# Patient Record
Sex: Female | Born: 1945
Health system: Southern US, Community
[De-identification: ages and names within clinical notes are randomized; demographics above are authoritative.]

## PROBLEM LIST (undated history)

## (undated) DIAGNOSIS — M8580 Other specified disorders of bone density and structure, unspecified site: Secondary | ICD-10-CM

## (undated) DIAGNOSIS — M1990 Unspecified osteoarthritis, unspecified site: Secondary | ICD-10-CM

## (undated) DIAGNOSIS — B2790 Infectious mononucleosis, unspecified without complication: Secondary | ICD-10-CM

## (undated) DIAGNOSIS — M797 Fibromyalgia: Secondary | ICD-10-CM

## (undated) DIAGNOSIS — E039 Hypothyroidism, unspecified: Secondary | ICD-10-CM

## (undated) DIAGNOSIS — I471 Supraventricular tachycardia, unspecified: Secondary | ICD-10-CM

## (undated) DIAGNOSIS — E042 Nontoxic multinodular goiter: Secondary | ICD-10-CM

## (undated) DIAGNOSIS — E041 Nontoxic single thyroid nodule: Secondary | ICD-10-CM

## (undated) DIAGNOSIS — E079 Disorder of thyroid, unspecified: Secondary | ICD-10-CM

## (undated) DIAGNOSIS — R002 Palpitations: Secondary | ICD-10-CM

## (undated) DIAGNOSIS — R011 Cardiac murmur, unspecified: Secondary | ICD-10-CM

## (undated) DIAGNOSIS — Z8619 Personal history of other infectious and parasitic diseases: Secondary | ICD-10-CM

## (undated) DIAGNOSIS — B009 Herpesviral infection, unspecified: Secondary | ICD-10-CM

## (undated) DIAGNOSIS — Z9889 Other specified postprocedural states: Secondary | ICD-10-CM

## (undated) HISTORY — DX: Supraventricular tachycardia, unspecified: I47.10

## (undated) HISTORY — DX: Infectious mononucleosis, unspecified without complication: B27.90

## (undated) HISTORY — DX: Other specified disorders of bone density and structure, unspecified site: M85.80

## (undated) HISTORY — DX: Nontoxic multinodular goiter: E04.2

## (undated) HISTORY — DX: Hypothyroidism, unspecified: E03.9

## (undated) HISTORY — DX: Herpesviral infection, unspecified: B00.9

## (undated) HISTORY — DX: Nontoxic single thyroid nodule: E04.1

## (undated) HISTORY — DX: Supraventricular tachycardia: I47.1

## (undated) HISTORY — PX: OTHER SURGICAL HISTORY: SHX169

## (undated) HISTORY — DX: Disorder of thyroid, unspecified: E07.9

## (undated) HISTORY — DX: Cardiac murmur, unspecified: R01.1

## (undated) HISTORY — DX: Palpitations: R00.2

## (undated) HISTORY — PX: TONSILLECTOMY: SHX5217

## (undated) HISTORY — DX: Personal history of other infectious and parasitic diseases: Z86.19

---
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
## 1982-03-01 DIAGNOSIS — B009 Herpesviral infection, unspecified: Secondary | ICD-10-CM

## 1982-03-01 HISTORY — DX: Herpesviral infection, unspecified: B00.9

## 1997-10-02 ENCOUNTER — Ambulatory Visit (HOSPITAL_COMMUNITY): Admission: RE | Admit: 1997-10-02 | Discharge: 1997-10-02 | Payer: Self-pay | Admitting: *Deleted

## 1997-10-28 ENCOUNTER — Other Ambulatory Visit: Admission: RE | Admit: 1997-10-28 | Discharge: 1997-10-28 | Payer: Self-pay | Admitting: *Deleted

## 1999-04-16 ENCOUNTER — Ambulatory Visit (HOSPITAL_BASED_OUTPATIENT_CLINIC_OR_DEPARTMENT_OTHER): Admission: RE | Admit: 1999-04-16 | Discharge: 1999-04-16 | Payer: Self-pay | Admitting: Plastic Surgery

## 1999-07-03 ENCOUNTER — Other Ambulatory Visit: Admission: RE | Admit: 1999-07-03 | Discharge: 1999-07-03 | Payer: Self-pay | Admitting: *Deleted

## 1999-08-11 ENCOUNTER — Ambulatory Visit (HOSPITAL_COMMUNITY): Admission: RE | Admit: 1999-08-11 | Discharge: 1999-08-11 | Payer: Self-pay | Admitting: *Deleted

## 1999-08-11 ENCOUNTER — Encounter: Payer: Self-pay | Admitting: *Deleted

## 2000-11-24 ENCOUNTER — Other Ambulatory Visit: Admission: RE | Admit: 2000-11-24 | Discharge: 2000-11-24 | Payer: Self-pay | Admitting: *Deleted

## 2000-12-06 ENCOUNTER — Encounter: Payer: Self-pay | Admitting: *Deleted

## 2000-12-06 ENCOUNTER — Ambulatory Visit (HOSPITAL_COMMUNITY): Admission: RE | Admit: 2000-12-06 | Discharge: 2000-12-06 | Payer: Self-pay | Admitting: *Deleted

## 2000-12-15 ENCOUNTER — Encounter: Payer: Self-pay | Admitting: *Deleted

## 2000-12-15 ENCOUNTER — Encounter: Admission: RE | Admit: 2000-12-15 | Discharge: 2000-12-15 | Payer: Self-pay | Admitting: *Deleted

## 2000-12-15 HISTORY — PX: BREAST CYST ASPIRATION: SHX578

## 2003-05-17 ENCOUNTER — Ambulatory Visit (HOSPITAL_COMMUNITY): Admission: RE | Admit: 2003-05-17 | Discharge: 2003-05-17 | Payer: Self-pay | Admitting: *Deleted

## 2004-06-02 ENCOUNTER — Ambulatory Visit (HOSPITAL_COMMUNITY): Admission: RE | Admit: 2004-06-02 | Discharge: 2004-06-02 | Payer: Self-pay | Admitting: *Deleted

## 2004-06-25 ENCOUNTER — Other Ambulatory Visit: Admission: RE | Admit: 2004-06-25 | Discharge: 2004-06-25 | Payer: Self-pay | Admitting: *Deleted

## 2005-08-25 ENCOUNTER — Ambulatory Visit (HOSPITAL_COMMUNITY): Admission: RE | Admit: 2005-08-25 | Discharge: 2005-08-25 | Payer: Self-pay | Admitting: Obstetrics & Gynecology

## 2005-08-25 ENCOUNTER — Other Ambulatory Visit: Admission: RE | Admit: 2005-08-25 | Discharge: 2005-08-25 | Payer: Self-pay | Admitting: Obstetrics & Gynecology

## 2006-08-31 ENCOUNTER — Ambulatory Visit (HOSPITAL_COMMUNITY): Admission: RE | Admit: 2006-08-31 | Discharge: 2006-08-31 | Payer: Self-pay | Admitting: Obstetrics & Gynecology

## 2006-09-26 ENCOUNTER — Encounter: Admission: RE | Admit: 2006-09-26 | Discharge: 2006-09-26 | Payer: Self-pay | Admitting: Obstetrics & Gynecology

## 2006-09-30 ENCOUNTER — Other Ambulatory Visit: Admission: RE | Admit: 2006-09-30 | Discharge: 2006-09-30 | Payer: Self-pay | Admitting: Obstetrics & Gynecology

## 2007-10-06 ENCOUNTER — Ambulatory Visit (HOSPITAL_COMMUNITY): Admission: RE | Admit: 2007-10-06 | Discharge: 2007-10-06 | Payer: Self-pay | Admitting: Obstetrics & Gynecology

## 2007-10-06 ENCOUNTER — Other Ambulatory Visit: Admission: RE | Admit: 2007-10-06 | Discharge: 2007-10-06 | Payer: Self-pay | Admitting: Obstetrics & Gynecology

## 2008-10-30 ENCOUNTER — Encounter: Admission: RE | Admit: 2008-10-30 | Discharge: 2008-10-30 | Payer: Self-pay | Admitting: Obstetrics & Gynecology

## 2008-11-06 ENCOUNTER — Other Ambulatory Visit: Admission: RE | Admit: 2008-11-06 | Discharge: 2008-11-06 | Payer: Self-pay | Admitting: Interventional Radiology

## 2008-11-06 ENCOUNTER — Encounter (INDEPENDENT_AMBULATORY_CARE_PROVIDER_SITE_OTHER): Payer: Self-pay | Admitting: Interventional Radiology

## 2008-11-06 ENCOUNTER — Encounter: Admission: RE | Admit: 2008-11-06 | Discharge: 2008-11-06 | Payer: Self-pay | Admitting: Obstetrics & Gynecology

## 2009-09-19 ENCOUNTER — Encounter: Admission: RE | Admit: 2009-09-19 | Discharge: 2009-09-19 | Payer: Self-pay | Admitting: Endocrinology

## 2009-11-10 ENCOUNTER — Encounter: Admission: RE | Admit: 2009-11-10 | Discharge: 2009-11-10 | Payer: Self-pay | Admitting: Obstetrics & Gynecology

## 2010-03-21 ENCOUNTER — Other Ambulatory Visit: Payer: Self-pay | Admitting: Endocrinology

## 2010-03-21 DIAGNOSIS — E041 Nontoxic single thyroid nodule: Secondary | ICD-10-CM

## 2010-04-10 ENCOUNTER — Ambulatory Visit
Admission: RE | Admit: 2010-04-10 | Discharge: 2010-04-10 | Disposition: A | Discharge status: Home / Self Care | Payer: BC Managed Care – PPO | Source: Ambulatory Visit | Attending: Endocrinology | Admitting: Endocrinology

## 2010-04-10 DIAGNOSIS — E041 Nontoxic single thyroid nodule: Secondary | ICD-10-CM

## 2010-07-17 NOTE — Op Note (Signed)
[place]  Patient:    Lindsay [name], Lindsay [name]                      MRN: [id number] Proc. Date: 04/16/99 Adm. Date:  [id number] Attending:  Chapman Moss CC:         Teena Irani. Odis Luster, M.D.             Dr. Raquel Sarna                           Operative Report  PREOPERATIVE DIAGNOSES: 1. Suspicious lesion, undetermined behavior of the posterior trunk x 3. 2. Complex open wound trunk, total length greater than 3.5 cm. 3. Two lesions anterior trunk, which are irritated and suspicious lesion chest.  POSTOPERATIVE DIAGNOSES: 1. Suspicious lesion, undetermined behavior of the posterior trunk x 3. 2. Complex open wound trunk, total length greater than 3.5 cm. 3. Two lesions anterior trunk, which are irritated and suspicious lesion chest.  PROCEDURES PERFORMED: 1. Excision lesions of undetermined behavior, each one less than 0.5 cm, a total of    three. 2. Complex wound closure of the posterior trunk, 3.5 cm total length. 3. Excision two lesions abdominal area, less than 0.5 cm each. 4. Punch biopsy anterior chest.  SURGEON:  Teena Irani. Odis Luster, M.D.  ANESTHESIA:  1% Xylocaine with epinephrine plus bicarbonate.  CLINICAL NOTE:  This is a 65 year old woman, who had had previous excision of lesions of her back.  One of these appeared to be dysplastic.  This now has recurrent pigmentation in the scar and she presents for a wide excision of this  area.  In addition, she has two other lesions lower back, which are quite suspicious with irregular borders and she would like to have those removed. Also, two lesions, upper abdominal area that are irritated by clothing and warrant excision.  Finally, a lesion anterior chest, upper aspect, which is fairly extensive and may just represent an irritated ______ and this also warrants a punch biopsy for tissue diagnosis.  The nature of procedure, risks and possible  complications were discussed.  The possibility  of further surgery depending on he final pathology report for these findings and she wished to proceed.  DESCRIPTION OF PROCEDURE:  The patient taken to the operating room and placed first prone.  She was prepped with Betadine and draped with sterile drapes. Satisfactory local anesthesia was achieved and elliptical excisions of the various lesions performed.  Wounds irrigated thoroughly and excellent hemostasis having been noted, layered closure with 4-0 Monocryl interrupted, inverted deep sutures, 4-0 Monocryl running subcuticular sutures.  Steri-Strips and dry, sterile dressings applied.   She was then placed supine, again prepped with Betadine and draped with sterile  drapes.  Elliptical excision of the lesions on her abdomen was performed. Wounds irrigated and deep 4-0 Monocryl suture placed as needed and 6-0 Prolene running  simple sutures.  Punch biopsy taken lesion anterior chest and this would also irrigated thoroughly as had been done for all the other wounds and closed with  6-0 Prolene simple suture.  She tolerated the procedure well.  DISPOSITION:  She may shower starting tomorrow.  We will see her in the office ext week for recheck. DD:  04/16/99 TD:  04/16/99 Job: [id number]

## 2010-08-25 ENCOUNTER — Encounter: Payer: Self-pay | Admitting: Internal Medicine

## 2010-08-26 ENCOUNTER — Encounter: Payer: Self-pay | Admitting: Internal Medicine

## 2010-08-26 ENCOUNTER — Ambulatory Visit (INDEPENDENT_AMBULATORY_CARE_PROVIDER_SITE_OTHER): Payer: BC Managed Care – PPO | Admitting: Internal Medicine

## 2010-08-26 DIAGNOSIS — E669 Obesity, unspecified: Secondary | ICD-10-CM

## 2010-08-26 DIAGNOSIS — R002 Palpitations: Secondary | ICD-10-CM | POA: Insufficient documentation

## 2010-08-26 NOTE — Assessment & Plan Note (Signed)
Today I do not have any electrograms to review. I have discussed the case with Dr. Jacinto Halim who notes that she has had some SVT. I plan to review her strips but recommend a period of watchful waiting as she has been minimally asymptomatic with regard to any SVT.

## 2010-08-26 NOTE — Patient Instructions (Signed)
Your physician recommends that you schedule a follow-up appointment as needed  

## 2010-08-26 NOTE — Progress Notes (Signed)
HPI [name] is referred today for evaluation of palpitations by Dr. Jacinto Halim. The patient notes that she has had palpitations intermittently for 20 yrs. About 2 yrs ago, she began experiencing increasingly frequent episodes associated with the sensation that her heart is beating hard. She is not experiencing the sensation that her heart is racing. The patient has not had syncope. She notes that over the past few weeks the episodes have become even more frequent. She thinks that her spells are more frequent when she exerts herself. She has not had chest pain, sob, or syncope. No Known Allergies   Current Outpatient Prescriptions  Medication Sig Dispense Refill  . fish oil-omega-3 fatty acids 1000 MG capsule Take 2 g by mouth daily.        . Misc Natural Products (CALCIUM PLUS ADVANCED PO) Take by mouth. 2 tabs daily       . Multiple Vitamin (MULTIVITAMIN) tablet Take 1 tablet by mouth daily.        . Probiotic Product (PROBIOTIC FORMULA PO) Take by mouth. daily       . vitamin B-12 (CYANOCOBALAMIN) 1000 MCG tablet Take 1,000 mcg by mouth daily.        Marland Kitchen VITAMIN D, CHOLECALCIFEROL, PO Take by mouth. 1 tab daily          Past Medical History  Diagnosis Date  . Palpitations   . SVT (supraventricular tachycardia)   . Chest pain   . Hypothyroidism   . Multinodular goiter     ROS:   All systems reviewed and negative except as noted in the HPI.   Past Surgical History  Procedure Date  . Tonsillectomy      Family History  Problem Relation Age of Onset  . Dementia Mother 24    alive  . Transient ischemic attack Mother     hx of; has a pacemaker  . Other Father 49    old age. Develop CHF the last 4 months  . Other Sister 18    died of thymoma     History   Social History  . Marital Status: Legally Separated    Spouse Name: N/A    Number of Children: 2  . Years of Education: N/A   Occupational History  . Not on file.   Social History Main Topics  . Smoking status:  Former Smoker    Types: Cigarettes    Quit date: 03/01/1970  . Smokeless tobacco: Not on file  . Alcohol Use: Yes     1 drink per night  . Drug Use: No  . Sexually Active: Not on file   Other Topics Concern  . Not on file   Social History Narrative  . No narrative on file     BP 106/66  Pulse 70  Ht 5\' 3"  (1.6 m)  Wt 178 lb (80.74 kg)  BMI 31.53 kg/m2  Physical Exam:  Obese, well appearing NAD HEENT: Unremarkable Neck:  No JVD, no thyromegally Lymphatics:  No adenopathy Back:  No CVA tenderness Lungs:  Clear HEART:  Regular rate rhythm, no murmurs, no rubs, no clicks Abd:  Obese, positive bowel sounds, no organomegally, no rebound, no guarding Ext:  2 plus pulses, no edema, no cyanosis, no clubbing Skin:  No rashes no nodules Neuro:  CN II through XII intact, motor grossly intact  EKG NSR with frequent PAC's consistent with wandering atrial pacemaker DEVICE  Normal device function.  See PaceArt for details.   Assess/Plan:

## 2010-08-27 ENCOUNTER — Institutional Professional Consult (permissible substitution): Payer: BC Managed Care – PPO | Admitting: Internal Medicine

## 2010-10-05 ENCOUNTER — Ambulatory Visit (INDEPENDENT_AMBULATORY_CARE_PROVIDER_SITE_OTHER): Payer: BC Managed Care – PPO | Admitting: Internal Medicine

## 2010-10-05 ENCOUNTER — Encounter: Payer: Self-pay | Admitting: Internal Medicine

## 2010-10-05 VITALS — BP 84/58 | HR 62 | Resp 16 | Ht 63.0 in | Wt 178.0 lb

## 2010-10-05 DIAGNOSIS — I471 Supraventricular tachycardia, unspecified: Secondary | ICD-10-CM

## 2010-10-05 MED ORDER — METOPROLOL TARTRATE 25 MG PO TABS: 25.0000 mg | ORAL_TABLET | Freq: Two times a day (BID) | ORAL | Status: DC

## 2010-10-05 NOTE — Patient Instructions (Signed)
Your physician recommends that you schedule a follow-up appointment in: 4 months with Dr Ladona Ridgel  Your physician has recommended you make the following change in your medication: start Metoprolol 25mg  twice daily

## 2010-10-05 NOTE — Progress Notes (Signed)
HPI Lindsay [name] returns today for followup. She is a pleasant 65 yo woman with a h/o palpitations. When I saw her several weeks ago, she played her symptoms down. When we were able to obtain her cardiac monitor, she had evidence of SVT at a rate of 160/min. The patient has not had syncope and has minimal palpitations when she has SVT. No c/p or sob. No Known Allergies   Current Outpatient Prescriptions  Medication Sig Dispense Refill  . fish oil-omega-3 fatty acids 1000 MG capsule Take 2 g by mouth daily.        . Misc Natural Products (CALCIUM PLUS ADVANCED PO) Take by mouth. 2 tabs daily       . Multiple Vitamin (MULTIVITAMIN) tablet Take 1 tablet by mouth daily.        . Probiotic Product (PROBIOTIC FORMULA PO) Take by mouth. daily       . vitamin B-12 (CYANOCOBALAMIN) 1000 MCG tablet Take 1,000 mcg by mouth daily.        Marland Kitchen VITAMIN D, CHOLECALCIFEROL, PO Take by mouth. 1 tab daily       . metoprolol tartrate (LOPRESSOR) 25 MG tablet Take 1 tablet (25 mg total) by mouth 2 (two) times daily.  60 tablet  11     Past Medical History  Diagnosis Date  . Palpitations   . SVT (supraventricular tachycardia)   . Chest pain   . Hypothyroidism   . Multinodular goiter     ROS:   All systems reviewed and negative except as noted in the HPI.   Past Surgical History  Procedure Date  . Tonsillectomy      Family History  Problem Relation Age of Onset  . Dementia Mother 40    alive  . Transient ischemic attack Mother     hx of; has a pacemaker  . Other Father 71    old age. Develop CHF the last 4 months  . Other Sister 43    died of thymoma     History   Social History  . Marital Status: Legally Separated    Spouse Name: N/A    Number of Children: 2  . Years of Education: N/A   Occupational History  . Not on file.   Social History Main Topics  . Smoking status: Former Smoker    Types: Cigarettes    Quit date: 03/01/1970  . Smokeless tobacco: Not on file  . Alcohol  Use: Yes     1 drink per night  . Drug Use: No  . Sexually Active: Not on file   Other Topics Concern  . Not on file   Social History Narrative  . No narrative on file     BP 84/58  Pulse 62  Resp 16  Ht 5\' 3"  (1.6 m)  Wt 178 lb (80.74 kg)  BMI 31.53 kg/m2  Physical Exam:  Well appearing middle age woman, NAD HEENT: Unremarkable Neck:  No JVD, no thyromegally Lymphatics:  No adenopathy Back:  No CVA tenderness Lungs:  Clear with no wheezes, rales, or rhonchi HEART:  Regular rate rhythm, no murmurs, no rubs, no clicks Abd:  soft, positive bowel sounds, no organomegally, no rebound, no guarding Ext:  2 plus pulses, no edema, no cyanosis, no clubbing Skin:  No rashes no nodules Neuro:  CN II through XII intact, motor grossly intact  EKG Cardiac monitoring demonstrates SVT at 160/min.  Assess/Plan:

## 2010-10-06 DIAGNOSIS — I471 Supraventricular tachycardia: Secondary | ICD-10-CM | POA: Insufficient documentation

## 2010-10-06 NOTE — Assessment & Plan Note (Signed)
Today I discussed the treatment options. I have discussed the risks/benefits/goals/expectations of EPS/Catheter ablation and she would like to wait for now as her symptoms are not particularly severe. I will see her back in several months.

## 2010-10-20 ENCOUNTER — Encounter: Payer: Self-pay | Admitting: Internal Medicine

## 2010-10-23 ENCOUNTER — Other Ambulatory Visit: Payer: Self-pay | Admitting: Obstetrics & Gynecology

## 2010-10-23 DIAGNOSIS — Z1231 Encounter for screening mammogram for malignant neoplasm of breast: Secondary | ICD-10-CM

## 2010-11-13 ENCOUNTER — Telehealth: Payer: Self-pay | Admitting: Internal Medicine

## 2010-11-13 NOTE — Telephone Encounter (Signed)
Spoke with patient will have her scheduled at next available with Dr Ladona Ridgel to discuss ablation  Glynda Jaeger aware and will call patient

## 2010-11-13 NOTE — Telephone Encounter (Signed)
Patient calling discuss next step - medication not working.

## 2010-11-16 ENCOUNTER — Ambulatory Visit: Payer: BC Managed Care – PPO

## 2010-11-20 ENCOUNTER — Telehealth: Payer: Self-pay | Admitting: Internal Medicine

## 2010-11-20 NOTE — Telephone Encounter (Signed)
9/21--called pt and informed her that we are waiting for a cancellation in dr taylor's sched. So we may get her in to see dr taylor to explain ablation procedure --pt agrees and was glad we called to explain--nt

## 2010-11-20 NOTE — Telephone Encounter (Signed)
Pt is returning your call

## 2010-11-21 NOTE — Telephone Encounter (Signed)
Will have someone call her to put her on next available with Dr Ladona Ridgel

## 2010-11-23 ENCOUNTER — Ambulatory Visit
Admission: RE | Admit: 2010-11-23 | Discharge: 2010-11-23 | Disposition: A | Discharge status: Home / Self Care | Payer: BC Managed Care – PPO | Source: Ambulatory Visit | Attending: Obstetrics & Gynecology | Admitting: Obstetrics & Gynecology

## 2010-11-23 DIAGNOSIS — Z1231 Encounter for screening mammogram for malignant neoplasm of breast: Secondary | ICD-10-CM

## 2010-12-30 ENCOUNTER — Encounter: Payer: Self-pay | Admitting: Internal Medicine

## 2010-12-30 ENCOUNTER — Ambulatory Visit (INDEPENDENT_AMBULATORY_CARE_PROVIDER_SITE_OTHER): Payer: BC Managed Care – PPO | Admitting: Internal Medicine

## 2010-12-30 VITALS — BP 118/80 | HR 58 | Ht 63.5 in | Wt 184.1 lb

## 2010-12-30 DIAGNOSIS — I471 Supraventricular tachycardia: Secondary | ICD-10-CM

## 2010-12-30 NOTE — Assessment & Plan Note (Signed)
Her symptoms appear to be worse since our last visit. Today we discussed electrophysiology study and catheter ablation of SVT in detail. The patient is considering her options and will call us if she like to proceed with catheter ablation. For now she will continue beta blocker therapy. I have encouraged her to refrain from caffeine or alcoholic beverages. Also, she is asked to make sure that she gets probably asleep at night.

## 2010-12-30 NOTE — Patient Instructions (Signed)
Your physician wants you to follow-up in: May 2013. You will receive a reminder letter in the mail two months in advance. If you don't receive a letter, please call our office to schedule the follow-up appointment.  Please give our office a call should you decide to proceed with ablation.

## 2010-12-30 NOTE — Progress Notes (Signed)
HPI Lindsay [name] returns today for followup. I saw the patient several months ago with palpitations. Initially we did not have all of her records. Subsequently we found that she was having sustained supraventricular tachycardia. Since I saw her last, her symptoms have worsened. She was begun on beta blocker therapy by Dr. Jacinto Halim. Unfortunately her symptoms are not much improved and she thinks that she may be more fatigued. She has had no syncope but notes shortness of breath and weakness when she developed SVT. No Known Allergies   Current Outpatient Prescriptions  Medication Sig Dispense Refill  . fish oil-omega-3 fatty acids 1000 MG capsule Take 2 g by mouth daily.        . metoprolol tartrate (LOPRESSOR) 25 MG tablet Take 12.5 mg by mouth 2 (two) times daily.        . Misc Natural Products (CALCIUM PLUS ADVANCED PO) Take by mouth. 2 tabs daily       . Multiple Vitamin (MULTIVITAMIN) tablet Take 1 tablet by mouth daily.        . Probiotic Product (PROBIOTIC FORMULA PO) Take by mouth. daily       . vitamin B-12 (CYANOCOBALAMIN) 1000 MCG tablet Take 1,000 mcg by mouth daily.        Marland Kitchen VITAMIN D, CHOLECALCIFEROL, PO Take by mouth. 1 tab daily          Past Medical History  Diagnosis Date  . Palpitations   . SVT (supraventricular tachycardia)   . Chest pain   . Hypothyroidism   . Multinodular goiter     ROS:   All systems reviewed and negative except as noted in the HPI.   Past Surgical History  Procedure Date  . Tonsillectomy      Family History  Problem Relation Age of Onset  . Dementia Mother 72    alive  . Transient ischemic attack Mother     hx of; has a pacemaker  . Other Father 74    old age. Develop CHF the last 4 months  . Other Sister 48    died of thymoma     History   Social History  . Marital Status: Legally Separated    Spouse Name: N/A    Number of Children: 2  . Years of Education: N/A   Occupational History  . Not on file.   Social History Main  Topics  . Smoking status: Former Smoker    Types: Cigarettes    Quit date: 03/01/1970  . Smokeless tobacco: Not on file  . Alcohol Use: Yes     1 drink per night  . Drug Use: No  . Sexually Active: Not on file   Other Topics Concern  . Not on file   Social History Narrative  . No narrative on file     BP 118/80  Pulse 58  Ht 5' 3.5" (1.613 m)  Wt 184 lb 1.9 oz (83.516 kg)  BMI 32.10 kg/m2  Physical Exam:  Well appearing  Middle-age woman, NAD HEENT: Unremarkable Neck:  No JVD, no thyromegally Lymphatics:  No adenopathy Back:  No CVA tenderness Lungs:  Clear with no wheezes, rales, or rhonchi HEART:  Regular rate rhythm, no murmurs, no rubs, no clicks Abd:  soft, positive bowel sounds, no organomegally, no rebound, no guarding Ext:  2 plus pulses, no edema, no cyanosis, no clubbing Skin:  No rashes no nodules Neuro:  CN II through XII intact, motor grossly intact  Assess/Plan:

## 2011-02-25 ENCOUNTER — Encounter: Payer: Self-pay | Admitting: Internal Medicine

## 2011-03-03 ENCOUNTER — Other Ambulatory Visit: Payer: Self-pay | Admitting: Endocrinology

## 2011-03-03 DIAGNOSIS — E041 Nontoxic single thyroid nodule: Secondary | ICD-10-CM

## 2011-03-08 ENCOUNTER — Ambulatory Visit
Admission: RE | Admit: 2011-03-08 | Discharge: 2011-03-08 | Disposition: A | Discharge status: Home / Self Care | Payer: BC Managed Care – PPO | Source: Ambulatory Visit | Attending: Endocrinology | Admitting: Endocrinology

## 2011-03-08 DIAGNOSIS — E041 Nontoxic single thyroid nodule: Secondary | ICD-10-CM

## 2011-04-28 ENCOUNTER — Other Ambulatory Visit: Payer: Self-pay | Admitting: Endocrinology

## 2011-04-28 DIAGNOSIS — E049 Nontoxic goiter, unspecified: Secondary | ICD-10-CM

## 2011-05-13 ENCOUNTER — Encounter (HOSPITAL_COMMUNITY): Payer: Self-pay

## 2011-05-13 ENCOUNTER — Emergency Department (HOSPITAL_COMMUNITY)
Admission: EM | Admit: 2011-05-13 | Discharge: 2011-05-13 | Disposition: A | Discharge status: Home / Self Care | Payer: Worker's Compensation | Attending: Emergency Medicine | Admitting: Emergency Medicine

## 2011-05-13 DIAGNOSIS — Y990 Civilian activity done for income or pay: Secondary | ICD-10-CM | POA: Insufficient documentation

## 2011-05-13 DIAGNOSIS — Y9269 Other specified industrial and construction area as the place of occurrence of the external cause: Secondary | ICD-10-CM | POA: Insufficient documentation

## 2011-05-13 DIAGNOSIS — S0180XA Unspecified open wound of other part of head, initial encounter: Secondary | ICD-10-CM | POA: Insufficient documentation

## 2011-05-13 DIAGNOSIS — Z79899 Other long term (current) drug therapy: Secondary | ICD-10-CM | POA: Insufficient documentation

## 2011-05-13 DIAGNOSIS — L259 Unspecified contact dermatitis, unspecified cause: Secondary | ICD-10-CM | POA: Insufficient documentation

## 2011-05-13 DIAGNOSIS — E039 Hypothyroidism, unspecified: Secondary | ICD-10-CM | POA: Insufficient documentation

## 2011-05-13 DIAGNOSIS — Z87891 Personal history of nicotine dependence: Secondary | ICD-10-CM | POA: Insufficient documentation

## 2011-05-13 DIAGNOSIS — S0181XA Laceration without foreign body of other part of head, initial encounter: Secondary | ICD-10-CM

## 2011-05-13 DIAGNOSIS — L309 Dermatitis, unspecified: Secondary | ICD-10-CM

## 2011-05-13 DIAGNOSIS — I498 Other specified cardiac arrhythmias: Secondary | ICD-10-CM | POA: Insufficient documentation

## 2011-05-13 DIAGNOSIS — W208XXA Other cause of strike by thrown, projected or falling object, initial encounter: Secondary | ICD-10-CM | POA: Insufficient documentation

## 2011-05-13 MED ORDER — TRIAMCINOLONE ACETONIDE 0.1 % EX CREA: TOPICAL_CREAM | Freq: Two times a day (BID) | CUTANEOUS | Status: DC

## 2011-05-13 NOTE — Discharge Instructions (Signed)
Please use the triamcinolone cream for the rash. Additionally, consider getting lotion and/or body wash containing oatmeal to soothe your skin.  Your stitches will need to be removed in 5 days. This can be done at the doctor's office, at urgent care, or here in the ED. Keep the wound clean and dry. Use vitamin E, cocoa butter, or Mederma scar ointment to the area once the stitches are removed. Use extra sunscreen over the area to prevent the scar from darkening. If you run a fever, notice discharge from the wound or redness around it, return to the ED.  RESOURCE GUIDE  Dental Problems  Patients with Medicaid: [facility] [id number]                                           [phone] [address] Phone:  [phone]                                                  Phone:  [phone]  If unable to pay or uninsured, contact:  [facility] or [facility]. to become qualified for the adult dental clinic.  Chronic Pain Problems Contact [facility]  [phone] Patients need to be referred by their primary care doctor.  Insufficient Money for Medicine Contact [employer] Way:  call "211" or [facility] 562-357-9482.  No Primary Care Doctor Call [facility]  [phone] Other agencies that provide inexpensive medical care    [facility]  [phone]    [facility]  [phone]    [facility]  [phone]    [facility]  [phone]    [facility]  [phone]    [facility]  [phone]  Psychological Services [facility]  [phone] [facility] Services  [phone] [facility]   9867278819 (emergency services 269-610-9480)  Substance Abuse Resources [facility]  [phone] [facility] [phone] [facility] [place] [phone] Floydene Flock [phone] Residential & Outpatient Substance Abuse  Program  [phone]  Abuse/Neglect [facility] Child Abuse Hotline 213-303-3788 [facility] Child Abuse Hotline 5174322500 (After Hours)  Emergency Shelter [facility] (984)086-8909  Maternity Homes [facility] [place] [facility] 626-031-7805 [facility] [phone]  MRSA Hotline #:   253-817-3982    [facility] Resources  [facility]     [employer]                          [facility] Dept. [address] [place]                       [address]      [address]  [address]  Cristobal Goldmann Phone:  [phone]                                   Phone:  [phone]                 Phone:  [phone]  [facility] Phone:  [phone]  [facility] Child Abuse Hotline 251-308-7756 (754)662-6166 (After Hours)  Facial Laceration A facial laceration is a cut on the face. Lacerations usually heal quickly, but they need special care to reduce scarring. It will take 1 to 2 years for the scar to lose its redness and to heal completely. TREATMENT  Some facial lacerations may not require closure. Some lacerations may not be able to be closed due to an increased risk of infection. It is important to see your caregiver as soon as possible after an injury to minimize the risk of infection and to maximize the opportunity for successful closure. If closure is appropriate, pain medicines may be given, if needed. The wound will be cleaned to help prevent infection. Your caregiver will use stitches (sutures), staples, wound glue (adhesive), or skin adhesive strips to repair the laceration. These tools bring the skin edges together to allow for faster healing and a better cosmetic outcome. However, all wounds will heal with a scar.  Once the wound has healed, scarring can be minimized by covering the wound  with sunscreen during the day for 1 full year. Use a sunscreen with an SPF of at least 30. Sunscreen helps to reduce the pigment that will form in the scar. When applying sunscreen to a completely healed wound, massage the scar for a few minutes to help reduce the appearance of the scar. Use circular motions with your fingertips, on and around the scar. Do not massage a healing wound. HOME CARE INSTRUCTIONS For sutures:  Keep the wound clean and dry.   If you were given a bandage (dressing), you should change it at least once a day. Also change the dressing if it becomes wet or dirty, or as directed by your caregiver.   Wash the wound with soap and water 2 times a day. Rinse the wound off with water to remove all soap. Pat the wound dry with a clean towel.   After cleaning, apply a thin layer of the antibiotic ointment recommended by your caregiver. This will help prevent infection and keep the dressing from sticking.   You may shower as usual after the first 24 hours. Do not soak the wound in water until the sutures are removed.   Only take over-the-counter or prescription medicines for pain, discomfort, or fever as directed by your caregiver.   Get your sutures removed as directed by your caregiver. With facial lacerations, sutures should usually be taken out after 4 to 5 days to avoid stitch marks.   Wait a few days after your sutures are removed before applying makeup.  For skin adhesive strips:  Keep the wound clean and dry.   Do not get the skin adhesive strips wet. You may bathe carefully, using caution to keep the wound dry.   If the wound gets wet, pat it dry with a clean towel.   Skin adhesive strips will fall off on their own. You  may trim the strips as the wound heals. Do not remove skin adhesive strips that are still stuck to the wound. They will fall off in time.  For wound adhesive:  You may briefly wet your wound in the shower or bath. Do not soak or scrub the wound. Do  not swim. Avoid periods of heavy perspiration until the skin adhesive has fallen off on its own. After showering or bathing, gently pat the wound dry with a clean towel.   Do not apply liquid medicine, cream medicine, ointment medicine, or makeup to your wound while the skin adhesive is in place. This may loosen the film before your wound is healed.   If a dressing is placed over the wound, be careful not to apply tape directly over the skin adhesive. This may cause the adhesive to be pulled off before the wound is healed.   Avoid prolonged exposure to sunlight or tanning lamps while the skin adhesive is in place. Exposure to ultraviolet light in the first year will darken the scar.   The skin adhesive will usually remain in place for 5 to 10 days, then naturally fall off the skin. Do not pick at the adhesive film.  You may need a tetanus shot if:  You cannot remember when you had your last tetanus shot.   You have never had a tetanus shot.  If you get a tetanus shot, your arm may swell, get red, and feel warm to the touch. This is common and not a problem. If you need a tetanus shot and you choose not to have one, there is a rare chance of getting tetanus. Sickness from tetanus can be serious. SEEK IMMEDIATE MEDICAL CARE IF:  You develop redness, pain, or swelling around the wound.   There is yellowish-white fluid (pus) coming from the wound.   You develop chills or a fever.  MAKE SURE YOU:  Understand these instructions.   Will watch your condition.   Will get help right away if you are not doing well or get worse.  Document Released: 03/25/2004 Document Revised: 02/04/2011 Document Reviewed: 08/10/2010 [facility] Patient Information 2012 [place], [place].

## 2011-05-13 NOTE — ED Notes (Signed)
Pt states a heavy bin fell hitting her in the head, lac to rt side of forehead. Denies LOC, bleeding controlled.

## 2011-05-13 NOTE — ED Provider Notes (Signed)
History     CSN: [id number]  Arrival date & time 05/13/11  1339   First MD Initiated Contact with Patient 05/13/11 1439      Chief Complaint  Patient presents with  . Laceration    (Consider location/radiation/quality/duration/timing/severity/associated sxs/prior treatment) HPI History obtained from the patient. She states that she was at work earlier today when a heavy plastic then fell off a shelf, hitting her on the right side for head. Denies loss of consciousness associated with the event. Denies nausea, vomiting. Has not had any visual changes. Denies trouble walking. Denies headache, dizziness, weakness. Bleeding well controlled. She is not on any anticoagulants. Td UTD within last 5 yrs.  Patient additionally complains of a rash which has been present since Saturday. It is located to the bilateral upper extremities and trunk. She states it is very dry and itchy. Has been applying aloe lotion to it with minimal relief. She has not seen any significant spread of the rash since it started. Denies new soaps/chemical exposures or any other known new exposures.  Past Medical History  Diagnosis Date  . Palpitations   . SVT (supraventricular tachycardia)   . Chest pain   . Hypothyroidism   . Multinodular goiter     Past Surgical History  Procedure Date  . Tonsillectomy     Family History  Problem Relation Age of Onset  . Dementia Mother 62    alive  . Transient ischemic attack Mother     hx of; has a pacemaker  . Other Father 50    old age. Develop CHF the last 4 months  . Other Sister 29    died of thymoma    History  Substance Use Topics  . Smoking status: Former Smoker    Types: Cigarettes    Quit date: 03/01/1970  . Smokeless tobacco: Not on file  . Alcohol Use: Yes     1 drink per night    OB History    Grav Para Term Preterm Abortions TAB SAB Ect Mult Living                  Review of Systems  Constitutional: Negative.   HENT: Negative for facial  swelling, neck pain and neck stiffness.   Eyes: Negative for photophobia and visual disturbance.  Cardiovascular: Negative for chest pain.  Gastrointestinal: Negative for nausea, vomiting and abdominal pain.  Musculoskeletal: Negative for myalgias.  Skin: Positive for rash and wound.  Neurological: Negative for dizziness, weakness, light-headedness, numbness and headaches.    Allergies  Review of patient's allergies indicates no known allergies.  Home Medications   Current Outpatient Rx  Name Route Sig Dispense Refill  . CHELATED POTASSIUM 99 MG PO TABS Oral Take 1 tablet by mouth daily.    . OMEGA-3 FATTY ACIDS 1000 MG PO CAPS Oral Take 1 g by mouth daily.     Marland Kitchen MAGNESIUM OXIDE 400 MG PO TABS Oral Take 400 mg by mouth daily.    Marland Kitchen METOPROLOL TARTRATE 25 MG PO TABS Oral Take 12.5 mg by mouth 2 (two) times daily.      Marland Kitchen ONE-DAILY MULTI VITAMINS PO TABS Oral Take 1 tablet by mouth daily.      Marland Kitchen PROBIOTIC FORMULA PO Oral Take by mouth. daily       BP 128/44  Pulse 61  Temp(Src) 98 F (36.7 C) (Oral)  Resp 16  SpO2 98%  Physical Exam  Nursing note and vitals reviewed. Constitutional: She is oriented to  person, place, and time. She appears well-developed and well-nourished. No distress.  HENT:  Head: Normocephalic.    Eyes: Conjunctivae and EOM are normal. Pupils are equal, round, and reactive to light.  Neck: Normal range of motion. Neck supple.  Cardiovascular: Normal rate.   Pulmonary/Chest: Effort normal.  Neurological: She is alert and oriented to person, place, and time. No cranial nerve deficit. She exhibits normal muscle tone. Coordination normal.  Skin: Skin is warm and dry. She is not diaphoretic.       Approximately 2.5cm linear laceration to R superior forehead at the hairline. Minimal bleeding. Wound appears clean but is deep.  Psychiatric: She has a normal mood and affect.    ED Course  Procedures (including critical care time)  LACERATION REPAIR Performed  by: Grant Fontana Authorized by: Grant Fontana Consent: Verbal consent obtained. Risks and benefits: risks, benefits and alternatives were discussed Consent given by: patient Patient identity confirmed: provided demographic data Prepped and Draped in normal sterile fashion Wound explored  Laceration Location: R forehead  Laceration Length: 2.5cm  No Foreign Bodies seen or palpated  Anesthesia: local infiltration  Local anesthetic: lidocaine 1% without epinephrine  Anesthetic total: 6 ml  Irrigation method: syringe Amount of cleaning: standard  Skin closure: 7-0 Prolene  Number of sutures: 6  Technique: simple interrupted  Patient tolerance: Patient tolerated the procedure well with no immediate complications.   Labs Reviewed - No data to display No results found.   1. Laceration of forehead   2. Dermatitis       MDM  Patient with laceration to the forehead after a box fell on her head. Denies LOC. No evidence for head trauma. Wound was repaired which he tolerated well. Advised regarding wound care. Suture removal guidelines discussed.  Patient additionally complains of a rash in her bilateral upper shoulders and trunk. Appears consistent with dermatitis. Will treat with triamcinolone. She is advised to follow up with her family doctor if she is not improving. Return precautions discussed.        Grant Fontana, [place] 05/13/11 1743  Grant Fontana, [place] 05/13/11 1744

## 2011-05-15 NOTE — ED Provider Notes (Signed)
Medical screening examination/treatment/procedure(s) were performed by non-physician practitioner and as supervising physician I was immediately available for consultation/collaboration.  Celene Kras, MD 05/15/11 [phone]

## 2011-07-16 ENCOUNTER — Other Ambulatory Visit: Payer: Self-pay | Admitting: *Deleted

## 2011-07-16 ENCOUNTER — Ambulatory Visit (INDEPENDENT_AMBULATORY_CARE_PROVIDER_SITE_OTHER): Payer: Medicare Other | Admitting: Internal Medicine

## 2011-07-16 ENCOUNTER — Encounter: Payer: Self-pay | Admitting: Internal Medicine

## 2011-07-16 ENCOUNTER — Encounter: Payer: Self-pay | Admitting: *Deleted

## 2011-07-16 VITALS — BP 110/70 | HR 52 | Ht 63.5 in | Wt 165.0 lb

## 2011-07-16 DIAGNOSIS — I471 Supraventricular tachycardia: Secondary | ICD-10-CM

## 2011-07-16 NOTE — Assessment & Plan Note (Signed)
The patient's symptoms are improved with dietary therapy but persist. I've discussed the treatment options. The risks, goals, benefits, and expectations of catheter ablation of supraventricular tachycardia have been carefully reviewed with the patient and she wishes to proceed. This be scheduled early as possible pending at time.

## 2011-07-16 NOTE — Progress Notes (Signed)
HPI [name] returns today for followup. She is a very pleasant 66 year old woman with a history of recurrent SVT, who I saw initially several months ago. At that time she was not interested in pursuing catheter ablation. In the interim, she has tried to strictly control her diet by reducing caffeine intake, and avoiding alcohol. Her symptoms are improved though she continues to have episodes of SVT which start and stop suddenly and last for several minutes at a time. No syncope. No chest pain. She initially tried medical therapy but was intolerant. She would like to pursue catheter ablation. No Known Allergies   Current Outpatient Prescriptions  Medication Sig Dispense Refill  . Chelated Potassium 99 MG TABS Take 1 tablet by mouth daily.      . fish oil-omega-3 fatty acids 1000 MG capsule Take 1 g by mouth daily.       . magnesium oxide (MAG-OX) 400 MG tablet Take 400 mg by mouth daily.      . Multiple Vitamin (MULTIVITAMIN) tablet Take 1 tablet by mouth daily.        . Probiotic Product (PROBIOTIC FORMULA PO) Take by mouth. daily          Past Medical History  Diagnosis Date  . Palpitations   . SVT (supraventricular tachycardia)   . Chest pain   . Hypothyroidism   . Multinodular goiter     ROS:   All systems reviewed and negative except as noted in the HPI.   Past Surgical History  Procedure Date  . Tonsillectomy      Family History  Problem Relation Age of Onset  . Dementia Mother 9    alive  . Transient ischemic attack Mother     hx of; has a pacemaker  . Other Father 29    old age. Develop CHF the last 4 months  . Other Sister 57    died of thymoma     History   Social History  . Marital Status: Legally Separated    Spouse Name: N/A    Number of Children: 2  . Years of Education: N/A   Occupational History  . Not on file.   Social History Main Topics  . Smoking status: Former Smoker    Types: Cigarettes    Quit date: 03/01/1970  . Smokeless  tobacco: Not on file  . Alcohol Use: Yes     1 drink per night  . Drug Use: No  . Sexually Active: Not on file   Other Topics Concern  . Not on file   Social History Narrative  . No narrative on file     BP 110/70  Pulse 52  Ht 5' 3.5" (1.613 m)  Wt 165 lb (74.844 kg)  BMI 28.77 kg/m2  Physical Exam:  Well appearing middle-aged woman, NAD HEENT: Unremarkable Neck:  No JVD, no thyromegally Lungs:  Clear with no wheezes, rales, or rhonchi. HEART:  Regular rate rhythm, no murmurs, no rubs, no clicks Abd:  soft, positive bowel sounds, no organomegally, no rebound, no guarding Ext:  2 plus pulses, no edema, no cyanosis, no clubbing Skin:  No rashes no nodules Neuro:  CN II through XII intact, motor grossly intact  EKG Normal sinus rhythm with sinus bradycardia and first degree AV block.  Assess/Plan:

## 2011-07-16 NOTE — Patient Instructions (Signed)
See instruction sheet for ablation.  

## 2011-07-23 ENCOUNTER — Encounter (HOSPITAL_COMMUNITY): Payer: Self-pay | Admitting: Pharmacy Technician

## 2011-07-29 ENCOUNTER — Other Ambulatory Visit (INDEPENDENT_AMBULATORY_CARE_PROVIDER_SITE_OTHER): Payer: Medicare Other

## 2011-07-29 DIAGNOSIS — I471 Supraventricular tachycardia: Secondary | ICD-10-CM

## 2011-07-29 LAB — CBC WITH DIFFERENTIAL/PLATELET
Basophils Absolute: 0 10*3/uL (ref 0.0–0.1)
Basophils Relative: 0.4 % (ref 0.0–3.0)
Eosinophils Absolute: 0.2 10*3/uL (ref 0.0–0.7)
Eosinophils Relative: 3.8 % (ref 0.0–5.0)
HCT: 39 % (ref 36.0–46.0)
Hemoglobin: 12.9 g/dL (ref 12.0–15.0)
Lymphocytes Relative: 30.5 % (ref 12.0–46.0)
Lymphs Abs: 1.6 10*3/uL (ref 0.7–4.0)
MCHC: 33 g/dL (ref 30.0–36.0)
MCV: 95.8 fl (ref 78.0–100.0)
Monocytes Absolute: 0.5 10*3/uL (ref 0.1–1.0)
Monocytes Relative: 10.2 % (ref 3.0–12.0)
Neutro Abs: 3 10*3/uL (ref 1.4–7.7)
Neutrophils Relative %: 55.1 % (ref 43.0–77.0)
Platelets: 205 10*3/uL (ref 150.0–400.0)
RBC: 4.07 Mil/uL (ref 3.87–5.11)
RDW: 13.5 % (ref 11.5–14.6)
WBC: 5.4 10*3/uL (ref 4.5–10.5)

## 2011-07-29 LAB — BASIC METABOLIC PANEL
BUN: 16 mg/dL (ref 6–23)
CO2: 27 mEq/L (ref 19–32)
Calcium: 9.2 mg/dL (ref 8.4–10.5)
Chloride: 105 mEq/L (ref 96–112)
Creatinine, Ser: 0.8 mg/dL (ref 0.4–1.2)
GFR: 76.5 mL/min (ref >60.00)
Glucose, Bld: 78 mg/dL (ref 70–99)
Potassium: 4.2 mEq/L (ref 3.5–5.1)
Sodium: 141 mEq/L (ref 135–145)

## 2011-07-29 LAB — PROTIME-INR
INR: 0.9 ratio (ref 0.8–1.0)
Prothrombin Time: 10 s — ABNORMAL LOW (ref 10.2–12.4)

## 2011-08-05 ENCOUNTER — Ambulatory Visit (HOSPITAL_COMMUNITY)
Admission: RE | Admit: 2011-08-05 | Discharge: 2011-08-06 | Disposition: A | Discharge status: Home / Self Care | Payer: Medicare Other | Source: Ambulatory Visit | Attending: Internal Medicine | Admitting: Internal Medicine

## 2011-08-05 ENCOUNTER — Encounter (HOSPITAL_COMMUNITY)
Admission: RE | Disposition: A | Discharge status: Home / Self Care | Payer: Self-pay | Source: Ambulatory Visit | Attending: Internal Medicine

## 2011-08-05 ENCOUNTER — Encounter (HOSPITAL_COMMUNITY): Payer: Self-pay | Admitting: General Practice

## 2011-08-05 DIAGNOSIS — I471 Supraventricular tachycardia: Secondary | ICD-10-CM

## 2011-08-05 DIAGNOSIS — I498 Other specified cardiac arrhythmias: Secondary | ICD-10-CM | POA: Insufficient documentation

## 2011-08-05 DIAGNOSIS — Z9889 Other specified postprocedural states: Secondary | ICD-10-CM

## 2011-08-05 HISTORY — DX: Other specified postprocedural states: Z98.890

## 2011-08-05 HISTORY — DX: Unspecified osteoarthritis, unspecified site: M19.90

## 2011-08-05 HISTORY — PX: SUPRAVENTRICULAR TACHYCARDIA ABLATION: SHX5492

## 2011-08-05 SURGERY — SUPRAVENTRICULAR TACHYCARDIA ABLATION
Anesthesia: LOCAL

## 2011-08-05 MED ORDER — PNEUMOCOCCAL VAC POLYVALENT 25 MCG/0.5ML IJ INJ
0.5000 mL | INJECTION | Freq: Once | INTRAMUSCULAR | Status: DC
  Filled 2011-08-05: qty 0.5

## 2011-08-05 MED ORDER — SODIUM CHLORIDE 0.9 % IV SOLN
INTRAVENOUS | Status: DC
  Administered 2011-08-05: 1000 mL via INTRAVENOUS

## 2011-08-05 MED ORDER — OFF THE BEAT BOOK
Freq: Once | Status: AC
  Administered 2011-08-05: 22:00:00
  Filled 2011-08-05 (×2): qty 1

## 2011-08-05 MED ORDER — BUPIVACAINE HCL (PF) 0.25 % IJ SOLN
INTRAMUSCULAR | Status: AC
  Filled 2011-08-05: qty 60

## 2011-08-05 MED ORDER — SODIUM CHLORIDE 0.9 % IJ SOLN
3.0000 mL | Freq: Two times a day (BID) | INTRAMUSCULAR | Status: DC
  Administered 2011-08-05: 3 mL via INTRAVENOUS

## 2011-08-05 MED ORDER — HYDROCODONE-ACETAMINOPHEN 5-325 MG PO TABS: 1.0000 | ORAL_TABLET | ORAL | Status: DC | PRN

## 2011-08-05 MED ORDER — SODIUM CHLORIDE 0.9 % IJ SOLN: 3.0000 mL | INTRAMUSCULAR | Status: DC | PRN

## 2011-08-05 MED ORDER — FENTANYL CITRATE 0.05 MG/ML IJ SOLN
INTRAMUSCULAR | Status: AC
  Filled 2011-08-05: qty 2

## 2011-08-05 MED ORDER — ACETAMINOPHEN 325 MG PO TABS: 650.0000 mg | ORAL_TABLET | ORAL | Status: DC | PRN

## 2011-08-05 MED ORDER — HEPARIN (PORCINE) IN NACL 2-0.9 UNIT/ML-% IJ SOLN
INTRAMUSCULAR | Status: AC
  Filled 2011-08-05: qty 1000

## 2011-08-05 MED ORDER — MIDAZOLAM HCL 5 MG/5ML IJ SOLN
INTRAMUSCULAR | Status: AC
  Filled 2011-08-05: qty 5

## 2011-08-05 MED ORDER — SODIUM CHLORIDE 0.9 % IV SOLN: 250.0000 mL | INTRAVENOUS | Status: DC | PRN

## 2011-08-05 MED ORDER — HYDROXYUREA 500 MG PO CAPS
ORAL_CAPSULE | ORAL | Status: AC
  Filled 2011-08-05: qty 1

## 2011-08-05 MED ORDER — ONDANSETRON HCL 4 MG/2ML IJ SOLN: 4.0000 mg | Freq: Four times a day (QID) | INTRAMUSCULAR | Status: DC | PRN

## 2011-08-05 MED ORDER — LIDOCAINE HCL (PF) 1 % IJ SOLN
INTRAMUSCULAR | Status: AC
  Filled 2011-08-05: qty 60

## 2011-08-05 NOTE — H&P (View-Only) (Signed)
HPI Lindsay [name] returns today for followup. She is a very pleasant 66-year-old woman with a history of recurrent SVT, who I saw initially several months ago. At that time she was not interested in pursuing catheter ablation. In the interim, she has tried to strictly control her diet by reducing caffeine intake, and avoiding alcohol. Her symptoms are improved though she continues to have episodes of SVT which start and stop suddenly and last for several minutes at a time. No syncope. No chest pain. She initially tried medical therapy but was intolerant. She would like to pursue catheter ablation. No Known Allergies   Current Outpatient Prescriptions  Medication Sig Dispense Refill  . Chelated Potassium 99 MG TABS Take 1 tablet by mouth daily.      . fish oil-omega-3 fatty acids 1000 MG capsule Take 1 g by mouth daily.       . magnesium oxide (MAG-OX) 400 MG tablet Take 400 mg by mouth daily.      . Multiple Vitamin (MULTIVITAMIN) tablet Take 1 tablet by mouth daily.        . Probiotic Product (PROBIOTIC FORMULA PO) Take by mouth. daily          Past Medical History  Diagnosis Date  . Palpitations   . SVT (supraventricular tachycardia)   . Chest pain   . Hypothyroidism   . Multinodular goiter     ROS:   All systems reviewed and negative except as noted in the HPI.   Past Surgical History  Procedure Date  . Tonsillectomy      Family History  Problem Relation Age of Onset  . Dementia Mother 92    alive  . Transient ischemic attack Mother     hx of; has a pacemaker  . Other Father 88    old age. Develop CHF the last 4 months  . Other Sister 62    died of thymoma     History   Social History  . Marital Status: Legally Separated    Spouse Name: N/A    Number of Children: 2  . Years of Education: N/A   Occupational History  . Not on file.   Social History Main Topics  . Smoking status: Former Smoker    Types: Cigarettes    Quit date: 03/01/1970  . Smokeless  tobacco: Not on file  . Alcohol Use: Yes     1 drink per night  . Drug Use: No  . Sexually Active: Not on file   Other Topics Concern  . Not on file   Social History Narrative  . No narrative on file     BP 110/70  Pulse 52  Ht 5' 3.5" (1.613 m)  Wt 165 lb (74.844 kg)  BMI 28.77 kg/m2  Physical Exam:  Well appearing middle-aged woman, NAD HEENT: Unremarkable Neck:  No JVD, no thyromegally Lungs:  Clear with no wheezes, rales, or rhonchi. HEART:  Regular rate rhythm, no murmurs, no rubs, no clicks Abd:  soft, positive bowel sounds, no organomegally, no rebound, no guarding Ext:  2 plus pulses, no edema, no cyanosis, no clubbing Skin:  No rashes no nodules Neuro:  CN II through XII intact, motor grossly intact  EKG Normal sinus rhythm with sinus bradycardia and first degree AV block.  Assess/Plan:   

## 2011-08-05 NOTE — Op Note (Signed)
EPS/RFA of AVNRT without immediate complication. H#[id number].

## 2011-08-05 NOTE — Op Note (Signed)
[name], [name]               ACCOUNT NO.:  [id number]  MEDICAL RECORD NO.:  [id number]  LOCATION:  [facility]                         FACILITY:  [facility]  PHYSICIAN:  Lindsay Canning. Ladona Ridgel, MD    DATE OF BIRTH:  05-Mar-1945  DATE OF PROCEDURE:  08/05/2011 DATE OF DISCHARGE:                              OPERATIVE REPORT   SURGEON:  Lindsay Canning. Ladona Ridgel, MD  PROCEDURE PERFORMED:  Electrophysiologic study and RF catheter ablation of AV node reentrant tachycardia utilizing isoproterenol.  INTRODUCTION:  The patient is a 66 year old woman with recurrent tachypalpitations and documented SVT at rates of approximately 160 beats per minute.  She is now referred for catheter ablation.  PROCEDURE:  After informed consent was obtained, the patient was taken to the Diagnostic EP Lab in the fasting state.  After usual preparation and draping, intravenous fentanyl and midazolam was given for sedation. A 6-French hexapolar catheter was inserted percutaneously into the right jugular vein and advanced to the coronary sinus.  A 6-French quadripolar catheter was inserted percutaneously into the right femoral vein and advanced to the right ventricle.  A 6-French quadripolar catheter was inserted percutaneously in the right femoral vein and advanced to the His bundle region.  After measurement of the basic intervals, rapid ventricular pacing was carried out from the right ventricle, which demonstrated VA dissociation at 600 milliseconds.  Programmed ventricular stimulation was also carried out demonstrating VA dissociation.  Rapid atrial pacing was carried out demonstrating an AV Wenckebach cycle length initially of 500 milliseconds.  Programmed atrial stimulation was carried out at a base drive cycle length of 161/096 and the AV node ERP was demonstrated at 600/480.  It should be noted that there was initially no inducible SVT, but the patient did have double echo beats.  Isoproterenol was then infused at rates  between 1 and 4 mcg per minute.  Additional rapid ventricular pacing, programmed ventricular stimulation, programmed atrial stimulation, and rapid atrial pacing was carried out.  Nonsustained AV node reentrant tachycardia was easily inducible, but could not be sustained despite isoproterenol.  The cycle length was 390 milliseconds.  The tachycardia would start with prolongation of the PR interval with a jump.  Typically, there was double and triple echo beats, but at times, tachycardia could be induced up to approximately 20 beats.  The mapping of the tachycardia was carried out.  This demonstrated a VA interval that was essentially 0. PVCs could not be placed nor could ventricular pacing secondary to the inability to sustain the tachycardia.  At this point, a 7-French quadripolar ablation catheter was inserted percutaneously into the right femoral vein and advanced into the Koch's triangle where mapping was carried out.  Koch's triangle was somewhat larger than usual.  It was more anteriorly displaced.  A total of four RF energy applications were delivered to sites 5-9 in Koch's triangle.  Accelerated junctional rhythm was demonstrated.  Following ablation, isoproterenol was reinfused at 1-4 mcg per minute, and rapid atrial and ventricular pacing as well as programmed atrial and ventricular stimulation were carried out.  There was no echo beats, no double echo beats, and no inducible nonsustained SVT.  The patient was observed for 30 minutes  and during this time, the patient was carried out at multiple sites in the atrium and the ventricle.  There was no inducible arrhythmias.  At this point, the catheters were removed.  Hemostasis was assured and the patient was returned to her room in satisfactory condition.  COMPLICATIONS:  There were no immediate procedure complications.  RESULTS:  A.  Baseline ECG:  Baseline ECG demonstrates sinus bradycardia with first-degree AV block. B.   Baseline intervals:  Sinus node cycle length was 1260 milliseconds, the QRS duration was 82 milliseconds, the PR interval was 240 milliseconds, the HV interval was 53 milliseconds. C.  Rapid ventricular pacing:  At baseline, rapid ventricular pacing was carried out, which demonstrated VA Wenckebach at 400 milliseconds. During rapid ventricular pacing, the atrial activation was midline and decremental. D.  Programmed ventricular stimulation:  Programmed ventricular stimulation was carried out at base drive cycle length of 401 milliseconds.  The S1-S2 interval was stepwise decreased from 540 milliseconds down to 450 milliseconds with retrograde AV node ERP was observed.  During programmed ventricular stimulation, there were echo beats and double echo beats, but no sustained SVT. E.  Programmed atrial stimulation:  Programmed atrial stimulation was carried out from the atrium at a base drive cycle length of 027 milliseconds.  The S1-S2 interval was stepwise decreased down to 480 milliseconds where the AV node ERP was observed.  During programmed atrial stimulation, there were multiple AH jumps and echo beats, double and triple echo beats. F.  Rapid atrial pacing:  Rapid atrial pacing was carried out from the atrium at base drive cycle length of 253 milliseconds and stepwise decreased down to 500 milliseconds where AV Wenckebach was observed. During rapid atrial pacing, the PR interval was equal to the RR interval.  There was no sustained SVT. G.  Arrhythmia was observed. 1. AV node reentrant tachycardia.  Initiation was spontaneous, with     programmed ventricular stimulation, programmed atrial stimulation.     The duration was nonsustained.  The cycle length was 390     milliseconds.     a.     Mapping:  Mapping of Koch's triangle demonstrated a somewhat      larger than normal and anteriorly displaced Koch's triangle.     b.     RF energy application:  Total of four RF energy  applications      were delivered to sites 5-9 in Koch's triangle resulting in      accelerated junctional rhythm.  CONCLUSION:  This study demonstrates successful electrophysiologic study and RF catheter ablation of AV node reentrant tachycardia with successful slow pathway modification.  The procedure was limited by the patient's inability to active sustained SVT despite an aggressive pacing protocol.  Following ablation, however, slow pathway conduction was not observed.     Lindsay Canning. Ladona Ridgel, MD     GWT/MEDQ  D:  08/05/2011  T:  08/05/2011  Job:  [id number]  cc:   Pamella Pert, MD

## 2011-08-05 NOTE — Interval H&P Note (Signed)
History and Physical Interval Note:since prior clinic visit, no change in the history, physical exam, assessment and plan.  08/05/2011 7:17 AM  Lindsay [name]  has presented today for surgery, with the diagnosis of SVT  The various methods of treatment have been discussed with the patient and family. After consideration of risks, benefits and other options for treatment, the patient has consented to  Procedure(s) (LRB): SUPRAVENTRICULAR TACHYCARDIA ABLATION (N/A) as a surgical intervention .  The patients' history has been reviewed, patient examined, no change in status, stable for surgery.  I have reviewed the patients' chart and labs.  Questions were answered to the patient's satisfaction.     Lindsay [name]

## 2011-08-06 DIAGNOSIS — I498 Other specified cardiac arrhythmias: Secondary | ICD-10-CM

## 2011-08-06 NOTE — Progress Notes (Signed)
Patient ID: Lindsay [name], female   DOB: 12/23/1945, 66 y.o.   MRN: [id number] Subjective:  No complaints overnight.  Objective:  Vital Signs in the last 24 hours: Temp:  [97.2 F (36.2 C)-98.1 F (36.7 C)] 98.1 F (36.7 C) (06/07 0710) Pulse Rate:  [50-60] 56  (06/07 0710) Resp:  [18-20] 18  (06/07 0710) BP: (101-114)/(53-73) 105/66 mmHg (06/07 0710) SpO2:  [95 %-100 %] 96 % (06/07 0710)  Intake/Output from previous day: 06/06 0701 - 06/07 0700 In: 297.5 [P.O.:240; I.V.:57.5] Out: 250 [Urine:250] Intake/Output from this shift:    Physical Exam: Well appearing middle aged woman, NAD HEENT: Unremarkable Neck:  No JVD, no thyromegally Lungs:  Clear with no wheezes. HEART:  Regular rate rhythm, no murmurs, no rubs, no clicks Abd:  Flat, positive bowel sounds, no organomegally, no rebound, no guarding Ext:  2 plus pulses, no edema, no cyanosis, no clubbing Skin:  No rashes no nodules Neuro:  CN II through XII intact, motor grossly intact  Lab Results: No results found for this basename: WBC:2,HGB:2,PLT:2 in the last 72 hours No results found for this basename: NA:2,K:2,CL:2,CO2:2,GLUCOSE:2,BUN:2,CREATININE:2 in the last 72 hours No results found for this basename: TROPONINI:2,CK,MB:2 in the last 72 hours Hepatic Function Panel No results found for this basename: PROT,ALBUMIN,AST,ALT,ALKPHOS,BILITOT,BILIDIR,IBILI in the last 72 hours No results found for this basename: CHOL in the last 72 hours No results found for this basename: PROTIME in the last 72 hours  Imaging: No results found.  Cardiac Studies: Tele - NSR with rare PVC's. Assessment/Plan:  1. SVT 2. S/P catheter ablation Rec: ok to discharge. Usual followup. No change in meds.  LOS: 1 day    [name] M.D. 08/06/2011, 8:50 AM

## 2011-08-06 NOTE — Discharge Summary (Signed)
   ELECTROPHYSIOLOGY DISCHARGE SUMMARY    Patient ID: Lindsay [name],  MRN: [id number], DOB/AGE: 1945/04/04 67 y.o.  Admit date: 08/05/2011 Discharge date: 08/06/2011  Primary Care Physician: Warrick Parisian, MD Primary Cardiologist: [name], MD  Primary Discharge Diagnosis:  1. AVNRT s/p EPS with RF ablation  Secondary Discharge Diagnoses:  None  Procedures This Admission:  1. Electrophysiologic study and RF catheter ablation of AV node reentrant tachycardia utilizing isoproterenol. CONCLUSION: This study demonstrates successful electrophysiologic study and RF catheter ablation of AV node reentrant tachycardia with successful slow pathway modification. The procedure was limited by the patient's inability to active sustained SVT despite an aggressive pacing protocol. Following ablation, however, slow pathway conduction was not observed.  History and Hospital Course:  The patient is a 66 year old woman with recurrent tachypalpitations and documented SVT at rates of approximately 160 beats per minute. She is now referred for catheter ablation. On 08/05/2011, she underwent an EP study with RF ablation of AVNRT with successful slow pathway modification as outlined above. Please see full report. She tolerated this procedure well and there were no complications. She remains hemodynamically stable and her procedure site is intact with no significant or hematoma. She has been ambulating without difficulty. She has been seen, examined and deemed stable for discharge today by Dr. Ladona Ridgel.   Discharge Vitals: Blood pressure 105/66, pulse 56, temperature 98.1 F (36.7 C), temperature source Oral, resp. rate 18, height 5' 3.5" (1.613 m), weight 164 lb (74.39 kg), SpO2 96.00%.   Labs: Lab Results  Component Value Date   WBC 5.4 07/29/2011   HGB 12.9 07/29/2011   HCT 39.0 07/29/2011   MCV 95.8 07/29/2011   PLT 205.0 07/29/2011     Disposition:  The patient is being discharged in stable  condition.  Follow-up:  Follow up with [name], MD on 09/07/2011. (At 11:45 AM)    Contact information:   [address]  [address] [place] [address] [phone]   Discharge Medications:  Medication List  As of 08/06/2011  9:26 AM   TAKE these medications         CALCIUM + VIT D    Take 1 scoop by mouth daily.      MULTIVITAMIN   Take 1 tablet by mouth daily.      OVER THE COUNTER MEDICATION   Take 5 mLs by mouth daily. Fish Oil Liquid.      PROBIOTIC FORMULA    Take 1 capsule by mouth daily. New chapter All-Flora.      VITAMIN C    Take 300 tablets by mouth daily.      Duration of Discharge Encounter: Greater than 30 minutes including physician time.  Signed, Rick Duff, PA-C 08/06/2011, 9:26 AM

## 2011-09-07 ENCOUNTER — Encounter: Payer: Self-pay | Admitting: Internal Medicine

## 2011-10-04 ENCOUNTER — Ambulatory Visit
Admission: RE | Admit: 2011-10-04 | Discharge: 2011-10-04 | Disposition: A | Discharge status: Home / Self Care | Payer: Medicare Other | Source: Ambulatory Visit | Attending: Endocrinology | Admitting: Endocrinology

## 2011-10-04 DIAGNOSIS — E049 Nontoxic goiter, unspecified: Secondary | ICD-10-CM

## 2011-10-19 ENCOUNTER — Encounter: Payer: Self-pay | Admitting: Internal Medicine

## 2011-10-19 ENCOUNTER — Ambulatory Visit (INDEPENDENT_AMBULATORY_CARE_PROVIDER_SITE_OTHER): Payer: Medicare Other | Admitting: Internal Medicine

## 2011-10-19 VITALS — BP 110/64 | HR 66 | Ht 63.5 in | Wt 151.4 lb

## 2011-10-19 DIAGNOSIS — I471 Supraventricular tachycardia: Secondary | ICD-10-CM

## 2011-10-19 NOTE — Patient Instructions (Signed)
Your physician recommends that you schedule a follow-up appointment as needed  

## 2011-10-19 NOTE — Progress Notes (Signed)
HPI [name] returns today for followup. She is a 66 year old woman with a history of thyroid dysfunction and documented tachy palpitations do to SVT. She underwent catheter ablation of AV node reentrant tachycardia several weeks ago. Since then she has done well. She has had no recurrent sustained arrhythmia. She denies chest pain or shortness of breath. She is exercising regularly without difficulty. Over the last several months, she has lost 30 pounds. She denies syncope, chest pain, or shortness of breath. No Known Allergies   Current Outpatient Prescriptions  Medication Sig Dispense Refill  . Ascorbic Acid (VITAMIN C PO) Take 300 tablets by mouth daily.      . Calcium Carbonate-Vitamin D (CALCIUM + D PO) Take 1 scoop by mouth daily.      . Flaxseed, Linseed, (FLAX SEEDS PO) Take 30 mLs by mouth daily.      . Multiple Vitamin (MULTIVITAMIN) tablet Take 1 tablet by mouth daily.        . Probiotic Product (PROBIOTIC FORMULA PO) Take 1 capsule by mouth daily. New chapter All-Flora.         Past Medical History  Diagnosis Date  . Palpitations   . SVT (supraventricular tachycardia)   . Chest pain   . Hypothyroidism   . Multinodular goiter   . Arthritis   . S/P AV nodal ablation 08/05/2011    ROS:   All systems reviewed and negative except as noted in the HPI.   Past Surgical History  Procedure Date  . Tonsillectomy      Family History  Problem Relation Age of Onset  . Dementia Mother 55    alive  . Transient ischemic attack Mother     hx of; has a pacemaker  . Other Father 32    old age. Develop CHF the last 4 months  . Other Sister 63    died of thymoma     History   Social History  . Marital Status: Legally Separated    Spouse Name: N/A    Number of Children: 2  . Years of Education: N/A   Occupational History  . Not on file.   Social History Main Topics  . Smoking status: Former Smoker    Types: Cigarettes    Quit date: 03/01/1970  . Smokeless  tobacco: Never Used  . Alcohol Use: Yes     1 drink per night  . Drug Use: No  . Sexually Active: No   Other Topics Concern  . Not on file   Social History Narrative  . No narrative on file     BP 110/64  Pulse 66  Ht 5' 3.5" (1.613 m)  Wt 151 lb 6.4 oz (68.675 kg)  BMI 26.40 kg/m2  Physical Exam:  Well appearing middle-aged woman, NAD HEENT: Unremarkable Neck:  No JVD, no thyromegally Lungs:  Clear with no wheezes, rales, or rhonchi. HEART:  Regular rate rhythm, no murmurs, no rubs, no clicks Abd:  soft, positive bowel sounds, no organomegally, no rebound, no guarding Ext:  2 plus pulses, no edema, no cyanosis, no clubbing Skin:  No rashes no nodules Neuro:  CN II through XII intact, motor grossly intact  EKG Normal sinus rhythm with first-degree block and sinus arrhythmia.  Assess/Plan:

## 2011-10-19 NOTE — Assessment & Plan Note (Signed)
She has had no recurrent symptomatic arrhythmias. She will followup with [place] on an as needed bases

## 2011-11-05 ENCOUNTER — Other Ambulatory Visit: Payer: Self-pay | Admitting: Obstetrics & Gynecology

## 2011-11-05 DIAGNOSIS — Z1231 Encounter for screening mammogram for malignant neoplasm of breast: Secondary | ICD-10-CM

## 2011-11-25 ENCOUNTER — Ambulatory Visit
Admission: RE | Admit: 2011-11-25 | Discharge: 2011-11-25 | Disposition: A | Discharge status: Home / Self Care | Payer: BLUE CROSS/BLUE SHIELD | Source: Ambulatory Visit | Attending: Obstetrics & Gynecology | Admitting: Obstetrics & Gynecology

## 2011-11-25 DIAGNOSIS — Z1231 Encounter for screening mammogram for malignant neoplasm of breast: Secondary | ICD-10-CM

## 2012-03-01 DIAGNOSIS — I499 Cardiac arrhythmia, unspecified: Secondary | ICD-10-CM

## 2012-03-01 HISTORY — DX: Cardiac arrhythmia, unspecified: I49.9

## 2012-03-01 HISTORY — PX: CATARACT EXTRACTION W/ INTRAOCULAR LENS IMPLANT: SHX1309

## 2012-07-27 ENCOUNTER — Ambulatory Visit: Payer: Self-pay | Admitting: Sports Medicine

## 2012-08-22 ENCOUNTER — Ambulatory Visit: Payer: Self-pay | Admitting: Sports Medicine

## 2012-09-26 ENCOUNTER — Ambulatory Visit (INDEPENDENT_AMBULATORY_CARE_PROVIDER_SITE_OTHER): Payer: Medicare Other | Admitting: Sports Medicine

## 2012-09-26 VITALS — BP 111/70 | Ht 65.0 in | Wt 137.0 lb

## 2012-09-26 DIAGNOSIS — M25519 Pain in unspecified shoulder: Secondary | ICD-10-CM

## 2012-09-26 DIAGNOSIS — M25511 Pain in right shoulder: Secondary | ICD-10-CM

## 2012-09-26 DIAGNOSIS — M67921 Unspecified disorder of synovium and tendon, right upper arm: Secondary | ICD-10-CM

## 2012-09-26 DIAGNOSIS — M719 Bursopathy, unspecified: Secondary | ICD-10-CM

## 2012-09-26 DIAGNOSIS — M6790 Unspecified disorder of synovium and tendon, unspecified site: Secondary | ICD-10-CM

## 2012-09-26 DIAGNOSIS — M25531 Pain in right wrist: Secondary | ICD-10-CM | POA: Insufficient documentation

## 2012-09-26 DIAGNOSIS — M67929 Unspecified disorder of synovium and tendon, unspecified upper arm: Secondary | ICD-10-CM | POA: Insufficient documentation

## 2012-09-26 DIAGNOSIS — M25539 Pain in unspecified wrist: Secondary | ICD-10-CM

## 2012-09-26 MED ORDER — NITROGLYCERIN 0.2 MG/HR TD PT24: MEDICATED_PATCH | TRANSDERMAL | Status: DC

## 2012-09-26 NOTE — Progress Notes (Signed)
Patient ID: Lindsay [name], female   DOB: 1945-09-30, 67 y.o.   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 Palpation shows tenderness over bicipital groove. ROM is full in all planes. Rotator cuff strength normal throughout but elevation and ER tests cause pain No signs of impingement with negative Neer and Hawkin's tests, empty can except for mild pain Speeds and Yergason's tests are + at light stress. No labral pathology noted with negative Obrien's, negative clunk and good stability. Normal scapular function observed. No painful arc and no drop arm sign. No apprehension sign   [place] Supraspinatus shows a partial tear at ant distal insertion into humeral head Central tendon intact and no real retraction Marked increase in doppler flow Subscap, Ter Minor, Infraspin tendons are all normal AC normal  Biceps small tear noted with hypoechoic change about 1 to 2 cms above insertion of pec major  RT wrist Hypoechoic sheath proximal to median nerve along flexor tendons Noted on long and trans scans

## 2012-09-26 NOTE — Assessment & Plan Note (Signed)
Not painful now   consider compression  Try vit b6 for nerve sxs

## 2012-09-26 NOTE — Assessment & Plan Note (Signed)
She will need to start a rehab protocol Given RC exercises below shoulder level Restrict lifting  NTG protocol for small RC tears  Reck 4 to 6 weeks

## 2012-09-26 NOTE — Patient Instructions (Addendum)
Do Home exercise program as instructed  You have a partial rotator cuff tear and a partial biceps tendon tear  Start NTG protocol  Limitations at work:  No lifting > 10 lbs No lifting over head - you can go up to 90 degrees  We need to see you in 4 - 6 weeks to see response  2nd copy is for your employer

## 2012-09-26 NOTE — Assessment & Plan Note (Signed)
Add curls and forearm rolls

## 2012-10-24 ENCOUNTER — Other Ambulatory Visit: Payer: Self-pay | Admitting: Endocrinology

## 2012-10-24 DIAGNOSIS — E049 Nontoxic goiter, unspecified: Secondary | ICD-10-CM

## 2012-11-02 ENCOUNTER — Ambulatory Visit: Payer: Self-pay | Admitting: Sports Medicine

## 2012-11-02 ENCOUNTER — Ambulatory Visit
Admission: RE | Admit: 2012-11-02 | Discharge: 2012-11-02 | Disposition: A | Discharge status: Home / Self Care | Payer: Medicare Other | Source: Ambulatory Visit | Attending: Endocrinology | Admitting: Endocrinology

## 2012-11-02 DIAGNOSIS — E049 Nontoxic goiter, unspecified: Secondary | ICD-10-CM

## 2012-12-07 ENCOUNTER — Ambulatory Visit: Payer: Self-pay | Admitting: Sports Medicine

## 2013-01-15 ENCOUNTER — Other Ambulatory Visit: Payer: Self-pay

## 2013-01-15 DIAGNOSIS — Z1231 Encounter for screening mammogram for malignant neoplasm of breast: Secondary | ICD-10-CM

## 2013-01-30 ENCOUNTER — Ambulatory Visit
Admission: RE | Admit: 2013-01-30 | Discharge: 2013-01-30 | Disposition: A | Discharge status: Home / Self Care | Payer: Medicare Other | Source: Ambulatory Visit

## 2013-01-30 DIAGNOSIS — Z1231 Encounter for screening mammogram for malignant neoplasm of breast: Secondary | ICD-10-CM

## 2013-08-20 ENCOUNTER — Emergency Department (HOSPITAL_COMMUNITY)
Admission: EM | Admit: 2013-08-20 | Discharge: 2013-08-20 | Disposition: A | Discharge status: Home / Self Care | Payer: Medicare Other | Attending: Emergency Medicine | Admitting: Emergency Medicine

## 2013-08-20 ENCOUNTER — Encounter (HOSPITAL_COMMUNITY): Payer: Self-pay | Admitting: Emergency Medicine

## 2013-08-20 ENCOUNTER — Emergency Department (HOSPITAL_COMMUNITY): Payer: Medicare Other

## 2013-08-20 DIAGNOSIS — Z79899 Other long term (current) drug therapy: Secondary | ICD-10-CM | POA: Insufficient documentation

## 2013-08-20 DIAGNOSIS — R55 Syncope and collapse: Secondary | ICD-10-CM | POA: Diagnosis present

## 2013-08-20 DIAGNOSIS — Z862 Personal history of diseases of the blood and blood-forming organs and certain disorders involving the immune mechanism: Secondary | ICD-10-CM | POA: Insufficient documentation

## 2013-08-20 DIAGNOSIS — Z8679 Personal history of other diseases of the circulatory system: Secondary | ICD-10-CM | POA: Insufficient documentation

## 2013-08-20 DIAGNOSIS — Z87891 Personal history of nicotine dependence: Secondary | ICD-10-CM | POA: Insufficient documentation

## 2013-08-20 DIAGNOSIS — H8110 Benign paroxysmal vertigo, unspecified ear: Secondary | ICD-10-CM | POA: Diagnosis present

## 2013-08-20 DIAGNOSIS — R110 Nausea: Secondary | ICD-10-CM | POA: Insufficient documentation

## 2013-08-20 DIAGNOSIS — R42 Dizziness and giddiness: Secondary | ICD-10-CM | POA: Diagnosis present

## 2013-08-20 DIAGNOSIS — Z8639 Personal history of other endocrine, nutritional and metabolic disease: Secondary | ICD-10-CM | POA: Insufficient documentation

## 2013-08-20 DIAGNOSIS — M129 Arthropathy, unspecified: Secondary | ICD-10-CM | POA: Insufficient documentation

## 2013-08-20 DIAGNOSIS — Z9889 Other specified postprocedural states: Secondary | ICD-10-CM | POA: Insufficient documentation

## 2013-08-20 DIAGNOSIS — R61 Generalized hyperhidrosis: Secondary | ICD-10-CM | POA: Insufficient documentation

## 2013-08-20 LAB — COMPREHENSIVE METABOLIC PANEL
ALT: 23 U/L (ref 0–35)
AST: 22 U/L (ref 0–37)
Albumin: 3.4 g/dL — ABNORMAL LOW (ref 3.5–5.2)
Alkaline Phosphatase: 71 U/L (ref 39–117)
BUN: 12 mg/dL (ref 6–23)
CO2: 25 mEq/L (ref 19–32)
Calcium: 9.1 mg/dL (ref 8.4–10.5)
Chloride: 107 mEq/L (ref 96–112)
Creatinine, Ser: 0.76 mg/dL (ref 0.50–1.10)
GFR calc Af Amer: >90 mL/min (ref >90)
GFR calc non Af Amer: 85 mL/min — ABNORMAL LOW (ref >90)
Glucose, Bld: 118 mg/dL — ABNORMAL HIGH (ref 70–99)
Potassium: 4.1 mEq/L (ref 3.7–5.3)
Sodium: 143 mEq/L (ref 137–147)
Total Bilirubin: 0.6 mg/dL (ref 0.3–1.2)
Total Protein: 6.2 g/dL (ref 6.0–8.3)

## 2013-08-20 LAB — URINALYSIS, ROUTINE W REFLEX MICROSCOPIC
Bilirubin Urine: NEGATIVE
Glucose, UA: NEGATIVE mg/dL
Hgb urine dipstick: NEGATIVE
Ketones, ur: 15 mg/dL — AB
Leukocytes, UA: NEGATIVE
Nitrite: NEGATIVE
Protein, ur: NEGATIVE mg/dL
Specific Gravity, Urine: 1.02 (ref 1.005–1.030)
Urobilinogen, UA: 0.2 mg/dL (ref 0.0–1.0)
pH: 6.5 (ref 5.0–8.0)

## 2013-08-20 LAB — CBC
HCT: 34.2 % — ABNORMAL LOW (ref 36.0–46.0)
Hemoglobin: 11.3 g/dL — ABNORMAL LOW (ref 12.0–15.0)
MCH: 30.9 pg (ref 26.0–34.0)
MCHC: 33 g/dL (ref 30.0–36.0)
MCV: 93.4 fL (ref 78.0–100.0)
Platelets: 161 10*3/uL (ref 150–400)
RBC: 3.66 MIL/uL — ABNORMAL LOW (ref 3.87–5.11)
RDW: 12.8 % (ref 11.5–15.5)
WBC: 4.4 10*3/uL (ref 4.0–10.5)

## 2013-08-20 LAB — TROPONIN I: Troponin I: <0.3 ng/mL (ref <0.30)

## 2013-08-20 MED ORDER — SODIUM CHLORIDE 0.9 % IV BOLUS (SEPSIS)
1000.0000 mL | INTRAVENOUS | Status: AC
  Administered 2013-08-20: 1000 mL via INTRAVENOUS

## 2013-08-20 MED ORDER — ONDANSETRON HCL 4 MG/2ML IJ SOLN
4.0000 mg | Freq: Once | INTRAMUSCULAR | Status: AC
  Administered 2013-08-20: 4 mg via INTRAVENOUS
  Filled 2013-08-20: qty 2

## 2013-08-20 NOTE — ED Provider Notes (Signed)
CSN: [id number]     Arrival date & time 08/20/13  0759 History   First MD Initiated Contact with Patient 08/20/13 [phone]     Chief Complaint  Patient presents with  . Near Syncope     (Consider location/radiation/quality/duration/timing/severity/associated sxs/prior Treatment) Patient is a 68 y.o. female presenting with near-syncope.  Near Syncope This is a new problem. Episode onset: 6:30 aM. The problem occurs constantly. Progression since onset: waxing/waning. Pertinent negatives include no chest pain, no abdominal pain, no headaches and no shortness of breath. The symptoms are aggravated by walking. The symptoms are relieved by lying down. She has tried nothing for the symptoms. The treatment provided no relief.    Past Medical History  Diagnosis Date  . Palpitations   . SVT (supraventricular tachycardia)   . Chest pain   . Hypothyroidism   . Multinodular goiter   . Arthritis   . S/P AV nodal ablation 08/05/2011   Past Surgical History  Procedure Laterality Date  . Tonsillectomy     Family History  Problem Relation Age of Onset  . Dementia Mother 32    alive  . Transient ischemic attack Mother     hx of; has a pacemaker  . Other Father 46    old age. Develop CHF the last 4 months  . Other Sister 44    died of thymoma   History  Substance Use Topics  . Smoking status: Former Smoker    Types: Cigarettes    Quit date: 03/01/1970  . Smokeless tobacco: Never Used  . Alcohol Use: Yes     Comment: 1 drink per night   OB History   Grav Para Term Preterm Abortions TAB SAB Ect Mult Living                 Review of Systems  Constitutional: Positive for diaphoresis. Negative for fever and fatigue.  HENT: Negative for congestion and drooling.   Eyes: Negative for pain.  Respiratory: Negative for cough and shortness of breath.   Cardiovascular: Positive for near-syncope. Negative for chest pain.  Gastrointestinal: Positive for nausea. Negative for vomiting, abdominal  pain and diarrhea.  Genitourinary: Negative for dysuria and hematuria.  Musculoskeletal: Negative for back pain, gait problem and neck pain.  Skin: Negative for color change.  Neurological: Negative for dizziness and headaches.  Hematological: Negative for adenopathy.  Psychiatric/Behavioral: Negative for behavioral problems.  All other systems reviewed and are negative.     Allergies  Review of patient's allergies indicates no known allergies.  Home Medications   Prior to Admission medications   Medication Sig Start Date End Date Taking? Authorizing Provider  Ascorbic Acid (VITAMIN C PO) Take 300 tablets by mouth daily.    Historical Provider, MD  Calcium Carbonate-Vitamin D (CALCIUM + D PO) Take 1 scoop by mouth daily.    Historical Provider, MD  Flaxseed, Linseed, (FLAX SEEDS PO) Take 30 mLs by mouth daily.    Historical Provider, MD  nitroGLYCERIN (NITRODUR - DOSED IN MG/24 HR) 0.2 mg/hr Use 1/4 patch daily to the affected area. Replace with 1/4 every 24 hours in a different area around the affected site 09/26/12   Stefanie Libel, MD  Probiotic Product (PROBIOTIC FORMULA PO) Take 1 capsule by mouth daily. New chapter All-Flora.    Historical Provider, MD   BP 127/57  Pulse 61  Temp(Src) 95 F (35 C) (Oral)  Resp 15  Ht 5' 4.5" (1.638 m)  Wt 140 lb (63.504 kg)  BMI 23.67  kg/m2  SpO2 99% Physical Exam  Nursing note and vitals reviewed. Constitutional: She is oriented to person, place, and time. She appears well-developed and well-nourished.  HENT:  Head: Normocephalic and atraumatic.  Mouth/Throat: Oropharynx is clear and moist. No oropharyngeal exudate.  Eyes: Conjunctivae and EOM are normal. Pupils are equal, round, and reactive to light.  Neck: Normal range of motion. Neck supple.  Cardiovascular: Normal rate, regular rhythm, normal heart sounds and intact distal pulses.  Exam reveals no gallop and no friction rub.   No murmur heard. Pulmonary/Chest: Effort normal and  breath sounds normal. No respiratory distress. She has no wheezes.  Abdominal: Soft. Bowel sounds are normal. There is no tenderness. There is no rebound and no guarding.  Musculoskeletal: Normal range of motion. She exhibits no edema and no tenderness.  Neurological: She is alert and oriented to person, place, and time.  alert, oriented x3 speech: normal in context and clarity memory: intact grossly cranial nerves II-XII: intact motor strength: full proximally and distally no involuntary movements or tremors sensation: intact to light touch diffusely  cerebellar: finger-to-nose and heel-to-shin intact gait: normal forwards and backwards, dizziness worsened w/ standing   Skin: Skin is warm and dry.  Psychiatric: She has a normal mood and affect. Her behavior is normal.    ED Course  Procedures (including critical care time) Labs Review Labs Reviewed  CBC - Abnormal; Notable for the following:    RBC 3.66 (*)    Hemoglobin 11.3 (*)    HCT 34.2 (*)    All other components within normal limits  COMPREHENSIVE METABOLIC PANEL - Abnormal; Notable for the following:    Glucose, Bld 118 (*)    Albumin 3.4 (*)    GFR calc non Af Amer 85 (*)    All other components within normal limits  URINALYSIS, ROUTINE W REFLEX MICROSCOPIC - Abnormal; Notable for the following:    Ketones, ur 15 (*)    All other components within normal limits  URINE CULTURE  TROPONIN I    Imaging Review Dg Chest 2 View  08/20/2013   CLINICAL DATA:  Near syncope. Vertigo, dizziness, and nausea. Rule out infection.  EXAM: CHEST  2 VIEW  COMPARISON:  06/28/2007  FINDINGS: The cardiac silhouette is upper limits of normal in size to slightly enlarged, similar to prior. The lungs are well inflated and clear. No pleural effusion or pneumothorax is identified. No acute osseous abnormality is identified.  IMPRESSION: No active cardiopulmonary disease.   Electronically Signed   By: Logan Bores   On: 08/20/2013 09:20   Ct  Head Wo Contrast  08/20/2013   CLINICAL DATA:  Near syncope, dizziness.  EXAM: CT HEAD WITHOUT CONTRAST  TECHNIQUE: Contiguous axial images were obtained from the base of the skull through the vertex without intravenous contrast.  COMPARISON:  None.  FINDINGS: No acute intracranial abnormality. Specifically, no hemorrhage, hydrocephalus, mass lesion, acute infarction, or significant intracranial injury. No acute calvarial abnormality. Visualized paranasal sinuses and mastoids clear. Orbital soft tissues unremarkable.  IMPRESSION: Negative study.   Electronically Signed   By: Rolm Baptise M.D.   On: 08/20/2013 09:37     EKG Interpretation   Date/Time:  Monday August 20 2013 08:16:58 EDT Ventricular Rate:  57 PR Interval:  218 QRS Duration: 79 QT Interval:  454 QTC Calculation: 442 R Axis:   52 Text Interpretation:  Sinus rhythm Atrial premature complexes Borderline  prolonged PR interval Anterior infarct, old No significant change since  last  tracing Confirmed by Taos Tapp  MD, [place] ([id number]) on 08/20/2013  8:46:31 AM      MDM   Final diagnoses:  Dizziness  Near syncope    8:29 AM 68 y.o. female w hx of SVT s/p ablation, goiter who presents with a near syncopal episode and dizziness. She states that upon awakening at 6:30 AM this morning she got up to use the bathroom and began feeling dizzy and lightheaded after walking into the bathroom. She had a near syncopal episode but did not fall to the ground and was able to sit on the toilet. She notes that she laid back down and rested. She had ongoing symptoms of diaphoresis and mild dizziness which persisted. She notes that the symptoms have resolved to some degree but she still feels mildly. She denies any chest pain or shortness of breath. She is afebrile and vital signs are unremarkable here. Will get screening labwork and imaging.  11:56 AM: Pt's sx continue to resolve. Slight dizziness sensation remains, but pt remains ambulatory forwards  and backwards and continues to appear well. Doubt CVA as cause of dizziness. Offered admission, pt would prefer to go home.  I have discussed the diagnosis/risks/treatment options with the patient and believe the pt to be eligible for discharge home to follow-up with pcp this week. We also discussed returning to the ED immediately if new or worsening sx occur. We discussed the sx which are most concerning (e.g., worsening dizziness, fever) that necessitate immediate return. Medications administered to the patient during their visit and any new prescriptions provided to the patient are listed below.  Medications given during this visit Medications  sodium chloride 0.9 % bolus 1,000 mL (0 mLs Intravenous Stopped 08/20/13 1034)  ondansetron (ZOFRAN) injection 4 mg (4 mg Intravenous Given 08/20/13 0854)    New Prescriptions   No medications on file     Blanchard Kelch, MD 08/20/13 1903

## 2013-08-20 NOTE — Discharge Instructions (Signed)

## 2013-08-20 NOTE — ED Notes (Signed)
Pt from home, c/o near syncopal episode w/ nausea and diaphoresis. Denies pain or LOC

## 2013-08-20 NOTE — ED Notes (Signed)
Patient transported to X-ray 

## 2013-08-21 LAB — URINE CULTURE: Colony Count: >=100000

## 2013-09-27 ENCOUNTER — Other Ambulatory Visit: Payer: Self-pay | Admitting: Endocrinology

## 2013-09-27 DIAGNOSIS — E049 Nontoxic goiter, unspecified: Secondary | ICD-10-CM

## 2013-10-02 ENCOUNTER — Ambulatory Visit
Admission: RE | Admit: 2013-10-02 | Discharge: 2013-10-02 | Disposition: A | Discharge status: Home / Self Care | Payer: Medicare Other | Source: Ambulatory Visit | Attending: Endocrinology | Admitting: Endocrinology

## 2013-10-02 ENCOUNTER — Other Ambulatory Visit: Payer: Self-pay

## 2013-10-02 DIAGNOSIS — E049 Nontoxic goiter, unspecified: Secondary | ICD-10-CM

## 2013-12-24 ENCOUNTER — Other Ambulatory Visit: Payer: Self-pay

## 2013-12-24 DIAGNOSIS — Z1231 Encounter for screening mammogram for malignant neoplasm of breast: Secondary | ICD-10-CM

## 2014-02-01 ENCOUNTER — Ambulatory Visit
Admission: RE | Admit: 2014-02-01 | Discharge: 2014-02-01 | Disposition: A | Discharge status: Home / Self Care | Payer: Medicare Other | Source: Ambulatory Visit

## 2014-02-01 DIAGNOSIS — Z1231 Encounter for screening mammogram for malignant neoplasm of breast: Secondary | ICD-10-CM

## 2014-02-07 ENCOUNTER — Encounter (HOSPITAL_COMMUNITY): Payer: Self-pay | Admitting: Internal Medicine

## 2014-02-07 ENCOUNTER — Encounter: Payer: Self-pay | Admitting: Obstetrics & Gynecology

## 2014-02-08 ENCOUNTER — Ambulatory Visit: Payer: Self-pay | Admitting: Obstetrics & Gynecology

## 2014-02-08 ENCOUNTER — Telehealth: Payer: Self-pay | Admitting: Obstetrics & Gynecology

## 2014-02-08 NOTE — Telephone Encounter (Signed)
Pt came in today for her appointment and had to cancel due to insurance issues. She has [employer] and needs to have an authorization to be seen. Janett Billow will be working on this and I will call the patient to schedule once we get authorization. Pt was very upset and voiced her opinions to [facility].

## 2014-02-14 ENCOUNTER — Telehealth: Payer: Self-pay | Admitting: Obstetrics & Gynecology

## 2014-02-14 NOTE — Telephone Encounter (Signed)
Left a message for patient to call and schedule her next appointment. We did receive authorization for aex from [facility]. (authorization number attached to canceled appointment.

## 2014-08-26 ENCOUNTER — Other Ambulatory Visit: Payer: Self-pay

## 2014-09-30 ENCOUNTER — Ambulatory Visit (INDEPENDENT_AMBULATORY_CARE_PROVIDER_SITE_OTHER): Payer: Medicare Other | Admitting: Obstetrics & Gynecology

## 2014-09-30 ENCOUNTER — Encounter: Payer: Self-pay | Admitting: Obstetrics & Gynecology

## 2014-09-30 VITALS — BP 106/62 | HR 68 | Resp 16 | Ht 63.25 in | Wt 158.0 lb

## 2014-09-30 DIAGNOSIS — Z124 Encounter for screening for malignant neoplasm of cervix: Secondary | ICD-10-CM

## 2014-09-30 DIAGNOSIS — E2839 Other primary ovarian failure: Secondary | ICD-10-CM | POA: Diagnosis not present

## 2014-09-30 DIAGNOSIS — Z01419 Encounter for gynecological examination (general) (routine) without abnormal findings: Secondary | ICD-10-CM | POA: Diagnosis not present

## 2014-09-30 DIAGNOSIS — E785 Hyperlipidemia, unspecified: Secondary | ICD-10-CM

## 2014-09-30 NOTE — Progress Notes (Addendum)
69 y.o. G4P2 Divorced Caucasian Fe here for annual exam.  Doing well.  Has not been here since 11/13.  Reviewed history with pt.  No vaginal bleeding.  PCP:  [facility].  Last visit was 3/16.    No LMP recorded. Patient is postmenopausal.          Sexually active: No.  The current method of family planning is post menopausal status.    Exercising: Yes.    walking Smoker:  no  Health Maintenance: Pap:  01-20-12 neg MMG: 02-01-14 category b density, birads 1:neg Colonoscopy:  2011 f/u 9yrs BMD:   2010, 0.4/-0.7 TDaP:  2009 Labs: pcp Self breast exam: not done   reports that she quit smoking about 44 years ago. Her smoking use included Cigarettes. She has never used smokeless tobacco. She reports that she drinks alcohol. She reports that she does not use illicit drugs.  Past Medical History  Diagnosis Date  . Palpitations   . SVT (supraventricular tachycardia)   . Chest pain   . Hypothyroidism   . Multinodular goiter   . Arthritis   . S/P AV nodal ablation 08/05/2011  . Thyroid nodule   . History of hepatitis     and mononucleosis  . Herpes   . Mononucleosis 1967    Past Surgical History  Procedure Laterality Date  . Tonsillectomy    . Supraventricular tachycardia ablation N/A 08/05/2011    Procedure: SUPRAVENTRICULAR TACHYCARDIA ABLATION;  Surgeon: Evans Lance, MD;  Location[facility] C[facility];  Service: Cardiovascular;  Laterality: N/A;  . [place] iud      8/90    Current Outpatient Prescriptions  Medication Sig Dispense Refill  . Flaxseed, Linseed, (FLAX SEEDS PO) Take 30 mLs by mouth daily.    . Probiotic Product (PROBIOTIC FORMULA PO) Take 1 capsule by mouth daily. New chapter All-Flora.    . TURMERIC PO Take by mouth daily.     No current facility-administered medications for this visit.    Family History  Problem Relation Age of Onset  . Dementia Mother 5    alive  . Transient ischemic attack Mother     hx of; has a pacemaker  . Other  Father 58    old age. Develop CHF the last 4 months  . Other Sister 41    died of thymoma  . Other Maternal Aunt     nasal tumor  . Heart attack Maternal Grandfather   . Hypothyroidism Mother   . Hypothyroidism Maternal Aunt     ROS:  Pertinent items are noted in HPI.  Otherwise, a comprehensive ROS was negative.  Exam:   BP 106/62 mmHg  Pulse 68  Resp 16  Ht 5' 3.25" (1.607 m)  Wt 158 lb (71.668 kg)  BMI 27.75 kg/m2  LMP  Weight change: +11 (after losing over 50#) Height: 5' 3.25" (160.7 cm) Ht Readings from Last 3 Encounters:  09/30/14 5' 3.25" (1.607 m)  08/20/13 5' 4.5" (1.638 m)  09/26/12 5\' 5"  (1.651 m)    General appearance: alert, cooperative and appears stated age Head: Normocephalic, without obvious abnormality, atraumatic Neck: no adenopathy, supple, symmetrical, trachea midline and thyroid enlarged without discrete nodules Lungs: clear to auscultation bilaterally Breasts: normal appearance, no masses or tenderness Heart: regular rate and rhythm Abdomen: soft, non-tender; no masses,  no organomegaly Extremities: extremities normal, atraumatic, no cyanosis or edema Skin: Skin color, texture, turgor normal. No rashes or lesions Lymph nodes: Cervical, supraclavicular, and axillary nodes normal.  No abnormal inguinal nodes palpated Neurologic: Grossly normal  Pelvic: External genitalia:  no lesions              Urethra:  normal appearing urethra with no masses, tenderness or lesions              Bartholin's and Skene's: normal                 Vagina: normal appearing vagina with normal color and discharge, no lesions              Cervix: no lesions              Pap taken: Yes.   Bimanual Exam:  Uterus:  normal size, contour, position, consistency, mobility, non-tender              Adnexa: normal adnexa and no mass, fullness, tenderness               Rectovaginal: Confirms               Anus:  normal sphincter tone, no lesions  Chaperone present: Yes  A:   Well Woman with normal exam PMP, no HRT Hypothyroidism Known goiter (followed by Dr. Chalmers Cater) H/O SVT s/p cardiac ablation (cardiologist Dr. Lovena Le) Vertigo  P:   Mammogram yearly Pap smear as above Pap smear today.  Last one 11/13 Seeing Dr. Chalmers Cater yearly.  Has been doing yearly ultrasounds Lipids and CMP BMD order placed for doing with MMG. return annually or prn  An After Visit Summary was printed and given to the patient.

## 2014-09-30 NOTE — Addendum Note (Signed)
Addended by: Megan Salon on: 09/30/2014 02:22 PM   Modules accepted: Miquel Dunn

## 2014-10-01 LAB — COMPREHENSIVE METABOLIC PANEL
ALT: 16 U/L (ref 6–29)
AST: 17 U/L (ref 10–35)
Albumin: 4.1 g/dL (ref 3.6–5.1)
Alkaline Phosphatase: 71 U/L (ref 33–130)
BUN: 13 mg/dL (ref 7–25)
CO2: 28 mmol/L (ref 20–31)
Calcium: 9.1 mg/dL (ref 8.6–10.4)
Chloride: 104 mmol/L (ref 98–110)
Creat: 0.8 mg/dL (ref 0.50–0.99)
Glucose, Bld: 78 mg/dL (ref 65–99)
Potassium: 4 mmol/L (ref 3.5–5.3)
Sodium: 142 mmol/L (ref 135–146)
Total Bilirubin: 0.8 mg/dL (ref 0.2–1.2)
Total Protein: 6.4 g/dL (ref 6.1–8.1)

## 2014-10-01 LAB — LIPID PANEL
Cholesterol: 197 mg/dL (ref 125–200)
HDL: 80 mg/dL (ref >=46)
LDL Cholesterol: 100 mg/dL (ref <130)
Total CHOL/HDL Ratio: 2.5 Ratio (ref <=5.0)
Triglycerides: 83 mg/dL (ref <150)
VLDL: 17 mg/dL (ref <30)

## 2014-10-01 LAB — IPS PAP SMEAR ONLY

## 2014-10-14 ENCOUNTER — Other Ambulatory Visit: Payer: Self-pay | Admitting: Endocrinology

## 2014-10-14 DIAGNOSIS — E049 Nontoxic goiter, unspecified: Secondary | ICD-10-CM

## 2015-02-07 ENCOUNTER — Other Ambulatory Visit: Payer: Self-pay

## 2015-02-07 DIAGNOSIS — Z1231 Encounter for screening mammogram for malignant neoplasm of breast: Secondary | ICD-10-CM

## 2015-03-14 ENCOUNTER — Ambulatory Visit
Admission: RE | Admit: 2015-03-14 | Discharge: 2015-03-14 | Disposition: A | Discharge status: Home / Self Care | Payer: Medicare Other | Source: Ambulatory Visit

## 2015-03-14 ENCOUNTER — Ambulatory Visit
Admission: RE | Admit: 2015-03-14 | Discharge: 2015-03-14 | Disposition: A | Discharge status: Home / Self Care | Payer: Medicare Other | Source: Ambulatory Visit | Attending: Obstetrics & Gynecology | Admitting: Obstetrics & Gynecology

## 2015-03-14 DIAGNOSIS — Z1231 Encounter for screening mammogram for malignant neoplasm of breast: Secondary | ICD-10-CM

## 2015-03-14 DIAGNOSIS — E2839 Other primary ovarian failure: Secondary | ICD-10-CM

## 2015-05-02 DIAGNOSIS — H5213 Myopia, bilateral: Secondary | ICD-10-CM | POA: Diagnosis not present

## 2015-05-30 DIAGNOSIS — R358 Other polyuria: Secondary | ICD-10-CM | POA: Diagnosis not present

## 2015-05-30 DIAGNOSIS — D485 Neoplasm of uncertain behavior of skin: Secondary | ICD-10-CM | POA: Diagnosis not present

## 2015-05-30 DIAGNOSIS — E049 Nontoxic goiter, unspecified: Secondary | ICD-10-CM | POA: Diagnosis not present

## 2015-05-30 DIAGNOSIS — R5382 Chronic fatigue, unspecified: Secondary | ICD-10-CM | POA: Diagnosis not present

## 2015-06-14 DIAGNOSIS — Z23 Encounter for immunization: Secondary | ICD-10-CM | POA: Diagnosis not present

## 2015-06-24 ENCOUNTER — Ambulatory Visit: Payer: Medicare Other | Admitting: Podiatry

## 2015-06-27 ENCOUNTER — Ambulatory Visit: Payer: Medicare Other | Admitting: Podiatry

## 2015-07-25 ENCOUNTER — Ambulatory Visit: Payer: Medicare Other | Admitting: Podiatry

## 2015-10-10 ENCOUNTER — Inpatient Hospital Stay: Admission: RE | Admit: 2015-10-10 | Payer: Self-pay | Source: Ambulatory Visit

## 2015-11-14 ENCOUNTER — Ambulatory Visit
Admission: RE | Admit: 2015-11-14 | Discharge: 2015-11-14 | Disposition: A | Discharge status: Home / Self Care | Payer: Medicare Other | Source: Ambulatory Visit | Attending: Endocrinology | Admitting: Endocrinology

## 2015-11-14 DIAGNOSIS — E042 Nontoxic multinodular goiter: Secondary | ICD-10-CM | POA: Diagnosis not present

## 2015-11-14 DIAGNOSIS — E049 Nontoxic goiter, unspecified: Secondary | ICD-10-CM

## 2015-12-05 DIAGNOSIS — E049 Nontoxic goiter, unspecified: Secondary | ICD-10-CM | POA: Diagnosis not present

## 2016-01-18 ENCOUNTER — Encounter (HOSPITAL_COMMUNITY): Payer: Self-pay | Admitting: Emergency Medicine

## 2016-01-18 ENCOUNTER — Ambulatory Visit (HOSPITAL_COMMUNITY)
Admission: EM | Admit: 2016-01-18 | Discharge: 2016-01-18 | Disposition: A | Discharge status: Home / Self Care | Payer: Medicare Other | Attending: Emergency Medicine | Admitting: Emergency Medicine

## 2016-01-18 DIAGNOSIS — W57XXXA Bitten or stung by nonvenomous insect and other nonvenomous arthropods, initial encounter: Secondary | ICD-10-CM

## 2016-01-18 DIAGNOSIS — S30851A Superficial foreign body of abdominal wall, initial encounter: Secondary | ICD-10-CM

## 2016-01-18 NOTE — ED Triage Notes (Signed)
Patient presents to [facility] for a removal of a Tick. She states that the tick is embedded into her Left side. Patient states that she has not any fever or chills. She states that the spot is very itchy and sore.

## 2016-01-18 NOTE — ED Provider Notes (Signed)
[place]    CSN: [id number] Arrival date & time: 01/18/16  1736     History   Chief Complaint Chief Complaint  Patient presents with  . Tick Removal    HPI Lindsay [name] is a 70 y.o. female.   HPI She is a 70 year old woman here for tick removal. She noticed some itching in that location last night, and saw the tick this morning. There is no rash or erythema. She is pretty sure it attached last night.  Past Medical History:  Diagnosis Date  . Arthritis   . Chest pain   . Herpes   . History of hepatitis    and mononucleosis  . Hypothyroidism   . Mononucleosis 1967  . Multinodular goiter   . Palpitations   . S/P AV nodal ablation 08/05/2011  . SVT (supraventricular tachycardia) ([place])   . Thyroid nodule     Patient Active Problem List   Diagnosis Date Noted  . Near syncope 08/20/2013  . Dizziness 08/20/2013  . Pain in joint, shoulder region 09/26/2012  . Biceps tendinopathy 09/26/2012  . Right wrist pain 09/26/2012  . Paroxysmal SVT (supraventricular tachycardia) ([place]) 10/06/2010  . Palpitations 08/26/2010  . Obesity 08/26/2010    Past Surgical History:  Procedure Laterality Date  . paragard iud     8/90  . SUPRAVENTRICULAR TACHYCARDIA ABLATION N/A 08/05/2011   Procedure: SUPRAVENTRICULAR TACHYCARDIA ABLATION;  Surgeon: Evans Lance, MD;  Location[facility] C[facility] LAB;  Service: Cardiovascular;  Laterality: N/A;  . TONSILLECTOMY      OB History    Gravida Para Term Preterm AB Living   4 2     2 2    SAB TAB Ectopic Multiple Live Births   1 1             Home Medications    Prior to Admission medications   Medication Sig Start Date End Date Taking? Authorizing Provider  Flaxseed, Linseed, (FLAX SEEDS PO) Take 30 mLs by mouth daily.   Yes Historical Provider, MD  Probiotic Product (PROBIOTIC FORMULA PO) Take 1 capsule by mouth daily. New chapter All-Flora.   Yes Historical Provider, MD  TURMERIC PO Take by mouth daily.   Yes Historical  Provider, MD    Family History Family History  Problem Relation Age of Onset  . Dementia Mother 22    alive  . Transient ischemic attack Mother     hx of; has a pacemaker  . Hypothyroidism Mother   . Other Father 79    old age. Develop CHF the last 4 months  . Other Sister 40    died of thymoma  . Hypothyroidism Sister   . Other Maternal Aunt     nasal tumor  . Heart attack Maternal Grandfather   . Hypothyroidism Maternal Aunt   . Stroke Paternal Grandfather     Social History Social History  Substance Use Topics  . Smoking status: Former Smoker    Types: Cigarettes    Quit date: 03/01/1970  . Smokeless tobacco: Never Used  . Alcohol use 0.0 oz/week     Comment: occasional     Allergies   Patient has no known allergies.   Review of Systems Review of Systems As in history of present illness  Physical Exam Triage Vital Signs ED Triage Vitals  Enc Vitals Group     BP 01/18/16 1809 130/72     Pulse Rate 01/18/16 1809 67     Resp 01/18/16 1809 16  Temp 01/18/16 1809 98 F (36.7 C)     Temp Source 01/18/16 1809 Oral     SpO2 01/18/16 1809 100 %     Weight --      Height --      Head Circumference --      Peak Flow --      Pain Score 01/18/16 1812 3     Pain Loc --      Pain Edu? --      Excl. in [place]? --    No data found.   Updated Vital Signs BP 130/72 (BP Location: Left Arm)   Pulse 67   Temp 98 F (36.7 C) (Oral)   Resp 16   SpO2 100%   Visual Acuity Right Eye Distance:   Left Eye Distance:   Bilateral Distance:    Right Eye Near:   Left Eye Near:    Bilateral Near:     Physical Exam  Constitutional: She is oriented to person, place, and time. She appears well-developed and well-nourished. No distress.  Cardiovascular: Normal rate.   Pulmonary/Chest: Effort normal.  Neurological: She is alert and oriented to person, place, and time.  Skin:  Tick embedded on left side     UC Treatments / Results  Labs (all labs ordered are  listed, but only abnormal results are displayed) Labs Reviewed - No data to display  EKG  EKG Interpretation None       Radiology No results found.  Procedures .Foreign Body Removal Date/Time: 01/18/2016 7:07 PM Performed by: Melony Overly Authorized by: Melony Overly  Consent: Verbal consent obtained. Consent given by: patient Body area: skin General location: trunk Location details: left flank Anesthesia: local infiltration  Anesthesia: Local Anesthetic: lidocaine 1% without epinephrine Anesthetic total: 1 mL Localization method: visualized Removal mechanism: forceps and scalpel Depth: subcutaneous Complexity: simple 1 objects recovered. Objects recovered: tick Post-procedure assessment: foreign body removed Patient tolerance: Patient tolerated the procedure well with no immediate complications   (including critical care time)  Medications Ordered in UC Medications - No data to display   Initial Impression / Assessment and Plan / UC Course  I have reviewed the triage vital signs and the nursing notes.  Pertinent labs & imaging results that were available during my care of the patient were reviewed by me and considered in my medical decision making (see chart for details).  Clinical Course     Tick removed. Low risk of disease transmission given less than 24 hours. Warning signs for [place] and [facility] given to patient.  Final Clinical Impressions(s) / UC Diagnoses   Final diagnoses:  Tick bite with subsequent removal of tick    New Prescriptions New Prescriptions   No medications on file     Melony Overly, MD 01/18/16 1908

## 2016-01-18 NOTE — Discharge Instructions (Signed)
We removed the tick. Wash the area with soap and water twice a day. Apply Vaseline or Neosporin for the next 2 days. There is low risk of disease since the tick was on you for less than 24 hours. If you develop a spreading rash at the site, fevers, headache, joint pains, please follow-up for treatment.

## 2016-03-12 ENCOUNTER — Other Ambulatory Visit: Payer: Self-pay | Admitting: Obstetrics & Gynecology

## 2016-03-12 DIAGNOSIS — Z1231 Encounter for screening mammogram for malignant neoplasm of breast: Secondary | ICD-10-CM

## 2016-04-09 ENCOUNTER — Ambulatory Visit
Admission: RE | Admit: 2016-04-09 | Discharge: 2016-04-09 | Disposition: A | Discharge status: Home / Self Care | Payer: Medicare Other | Source: Ambulatory Visit | Attending: Obstetrics & Gynecology | Admitting: Obstetrics & Gynecology

## 2016-04-09 DIAGNOSIS — Z1231 Encounter for screening mammogram for malignant neoplasm of breast: Secondary | ICD-10-CM

## 2016-06-18 DIAGNOSIS — H5213 Myopia, bilateral: Secondary | ICD-10-CM | POA: Diagnosis not present

## 2016-08-13 DIAGNOSIS — H26491 Other secondary cataract, right eye: Secondary | ICD-10-CM | POA: Diagnosis not present

## 2016-08-27 ENCOUNTER — Encounter: Payer: Self-pay | Admitting: Gastroenterology

## 2016-08-27 ENCOUNTER — Encounter: Payer: Self-pay | Admitting: Family Medicine

## 2016-08-27 ENCOUNTER — Ambulatory Visit (INDEPENDENT_AMBULATORY_CARE_PROVIDER_SITE_OTHER): Payer: Medicare Other | Admitting: Family Medicine

## 2016-08-27 VITALS — BP 120/80 | HR 71 | Ht 63.25 in | Wt 173.0 lb

## 2016-08-27 DIAGNOSIS — Z1211 Encounter for screening for malignant neoplasm of colon: Secondary | ICD-10-CM | POA: Diagnosis not present

## 2016-08-27 DIAGNOSIS — E049 Nontoxic goiter, unspecified: Secondary | ICD-10-CM | POA: Diagnosis not present

## 2016-08-27 DIAGNOSIS — Z789 Other specified health status: Secondary | ICD-10-CM | POA: Diagnosis not present

## 2016-08-27 DIAGNOSIS — I471 Supraventricular tachycardia: Secondary | ICD-10-CM | POA: Diagnosis not present

## 2016-08-27 NOTE — Progress Notes (Signed)
New patient office visit note:  Impression and Recommendations:    1. Thyroid goiter   2. Screen for colon cancer   3. Vegan diet   4. Paroxysmal SVT (supraventricular tachycardia) (HCC)     The patient was counseled, risk factors were discussed, anticipatory guidance given.   Orders Placed This Encounter  Procedures  . Ambulatory referral to Gastroenterology    Gross side effects, risk and benefits, and alternatives of medications discussed with patient.  Patient is aware that all medications have potential side effects and we are unable to predict every side effect or drug-drug interaction that may occur.  Expresses verbal understanding and consents to current therapy plan and treatment regimen.  Return for FBW- near future, then OV with me after to discuss urination and knees.  Please see AVS handed out to patient at the end of our visit for further patient instructions/ counseling done pertaining to today's office visit.    Note: This document was prepared using Dragon voice recognition software and may include unintentional dictation errors.  ----------------------------------------------------------------------------------------------------------------------    Subjective:    Chief complaint:   Chief Complaint  Patient presents with  . Establish Care     HPI: Lindsay [name] is a pleasant 71 y.o. female who presents to [place] at [facility] today to review their medical history with me and establish care.   I asked the patient to review their chronic problem list with me to ensure everything was updated and accurate.    All recent office visits with other providers, any medical records that patient brought in etc  - I reviewed today.     Also asked pt to get me medical records from [name] providers/ specialists that they had seen within the past 3-5 years- if they are in private practice and/or do not work for a [employer], [facility],  [employer], [place] or [employer] owned practice.  Told them to call their specialists to clarify this if they are not sure.   Changing PCP-  [place] for yrs, but then went to see Dr Darron Doom- [facility] for couple- now here.    Smoked for 6 yrs total quit 45 yrs ago.   Dad- 44 yo- healthy;  Mom 20- dementia, cva   Wt Readings from Last 3 Encounters:  09/03/16 172 lb (78 kg)  08/27/16 173 lb (78.5 kg)  09/30/14 158 lb (71.7 kg)   BP Readings from Last 3 Encounters:  09/03/16 128/80  08/27/16 120/80  01/18/16 130/72   Pulse Readings from Last 3 Encounters:  09/03/16 76  08/27/16 71  01/18/16 67   BMI Readings from Last 3 Encounters:  09/03/16 30.47 kg/m  08/27/16 30.40 kg/m  09/30/14 27.77 kg/m    Patient Care Team    Relationship Specialty Notifications Start End  Mellody Dance, DO PCP - General Family Medicine  08/27/16   Adrian Prows, [place] Physician Cardiology  08/27/16   Evans Lance, MD Consulting Physician Cardiology  08/27/16   Juanita Craver, MD Consulting Physician Gastroenterology  08/27/16   Jacelyn Pi, MD Consulting Physician Endocrinology  08/27/16   Macarthur Critchley, [place] Referring Physician Optometry  08/27/16   Darleen Crocker, MD Consulting Physician Ophthalmology  08/27/16   Linton Rump, PT Physical Therapist Physical Therapy  08/27/16   Megan Salon, MD Consulting Physician Gynecology  08/27/16     Patient Active Problem List   Diagnosis Date Noted  . Vegan diet 08/27/2016  .  Thyroid goiter 08/27/2016  . Near syncope 08/20/2013  . Dizziness 08/20/2013  . Pain in joint, shoulder region 09/26/2012  . Biceps tendinopathy 09/26/2012  . Right wrist pain 09/26/2012  . Paroxysmal SVT (supraventricular tachycardia) ([place]) 10/06/2010  . Palpitations 08/26/2010  . Obesity 08/26/2010     Past Medical History:  Diagnosis Date  . Arthritis   . Chest pain   . Herpes   . History of hepatitis    and mononucleosis  . Hypothyroidism   .  Mononucleosis 1967  . Multinodular goiter   . Palpitations   . S/P AV nodal ablation 08/05/2011  . SVT (supraventricular tachycardia) ([place])   . Thyroid nodule      Past Medical History:  Diagnosis Date  . Arthritis   . Chest pain   . Herpes   . History of hepatitis    and mononucleosis  . Hypothyroidism   . Mononucleosis 1967  . Multinodular goiter   . Palpitations   . S/P AV nodal ablation 08/05/2011  . SVT (supraventricular tachycardia) ([place])   . Thyroid nodule      Past Surgical History:  Procedure Laterality Date  . BREAST CYST ASPIRATION Bilateral 12/15/2000  . paragard iud     8/90  . SUPRAVENTRICULAR TACHYCARDIA ABLATION N/A 08/05/2011   Procedure: SUPRAVENTRICULAR TACHYCARDIA ABLATION;  Surgeon: Evans Lance, MD;  Location: [facility] C[facility] LAB;  Service: Cardiovascular;  Laterality: N/A;  . TONSILLECTOMY       Family History  Problem Relation Age of Onset  . Dementia Mother 84       alive  . Transient ischemic attack Mother        hx of; has a pacemaker  . Hypothyroidism Mother   . Other Father 70       old age. Develop CHF the last 4 months  . Other Sister 29       died of thymoma  . Hypothyroidism Sister   . Other Maternal Aunt        nasal tumor  . Heart attack Maternal Grandfather   . Hypothyroidism Maternal Aunt   . Stroke Paternal Grandfather      History  Drug Use No     History  Alcohol Use  . 0.0 oz/week    Comment: occasional     History  Smoking Status  . Former Smoker  . Types: Cigarettes  . Quit date: 03/01/1970  Smokeless Tobacco  . Never Used     Outpatient Encounter Prescriptions as of 08/27/2016  Medication Sig  . aspirin EC 81 MG tablet Take 81 mg by mouth daily.  . cholecalciferol (VITAMIN D) 1000 units tablet Take 2,000 Units by mouth daily.  . Flaxseed, Linseed, (FLAX SEEDS PO) Take 30 mLs by mouth daily.  . folic acid (FOLVITE) 196 MCG tablet Take 400 mcg by mouth daily.  . Probiotic Product (PROBIOTIC FORMULA PO)  Take 1 capsule by mouth daily. New chapter All-Flora.  . TURMERIC PO Take by mouth daily.   No facility-administered encounter medications on file as of 08/27/2016.     Allergies: Patient has no known allergies.   ROS   Objective:   Blood pressure 120/80, pulse 71, height 5' 3.25" (1.607 m), weight 173 lb (78.5 kg). Body mass index is 30.4 kg/m. General: Well Developed, well nourished, and in no acute distress.  Neuro: Alert and oriented x3, extra-ocular muscles intact, sensation grossly intact.  HEENT:[place]/AT, PERRLA, neck supple, No carotid bruits Skin: no gross rashes  Cardiac: Regular rate  and rhythm Respiratory: Essentially clear to auscultation bilaterally. Not using accessory muscles, speaking in full sentences.  Abdominal: not grossly distended Musculoskeletal: Ambulates w/o diff, FROM * 4 ext.  Vasc: less 2 sec cap RF, warm and pink  Psych:  No HI/SI, judgement and insight good, Euthymic mood. Full Affect.    Recent Results (from the past 2160 hour(s))  CBC with Differential/Platelet     Status: None   Collection Time: 09/03/16  9:10 AM  Result Value Ref Range   WBC 4.8 3.4 - 10.8 x10E3/uL   RBC 4.25 3.77 - 5.28 x10E6/uL   Hemoglobin 12.9 11.1 - 15.9 g/dL   Hematocrit 39.0 34.0 - 46.6 %   MCV 92 79 - 97 fL   MCH 30.4 26.6 - 33.0 pg   MCHC 33.1 31.5 - 35.7 g/dL   RDW 13.4 12.3 - 15.4 %   Platelets 248 150 - 379 x10E3/uL   Neutrophils 52 Not Estab. %   Lymphs 31 Not Estab. %   Monocytes 12 Not Estab. %   Eos 5 Not Estab. %   Basos 0 Not Estab. %   Neutrophils Absolute 2.5 1.4 - 7.0 x10E3/uL   Lymphocytes Absolute 1.5 0.7 - 3.1 x10E3/uL   Monocytes Absolute 0.6 0.1 - 0.9 x10E3/uL   EOS (ABSOLUTE) 0.2 0.0 - 0.4 x10E3/uL   Basophils Absolute 0.0 0.0 - 0.2 x10E3/uL   Immature Granulocytes 0 Not Estab. %   Immature Grans (Abs) 0.0 0.0 - 0.1 x10E3/uL  Comprehensive metabolic panel     Status: None   Collection Time: 09/03/16  9:10 AM  Result Value Ref Range    Glucose 89 65 - 99 mg/dL   BUN 12 8 - 27 mg/dL   Creatinine, Ser 0.89 0.57 - 1.00 mg/dL   GFR calc non Af Amer 66 >59 mL/min/1.73   GFR calc Af Amer 76 >59 mL/min/1.73   BUN/Creatinine Ratio 13 12 - 28   Sodium 142 134 - 144 mmol/L   Potassium 4.3 3.5 - 5.2 mmol/L   Chloride 106 96 - 106 mmol/L   CO2 24 20 - 29 mmol/L    Comment:               **Please note reference interval change**   Calcium 9.2 8.7 - 10.3 mg/dL   Total Protein 6.3 6.0 - 8.5 g/dL   Albumin 4.3 3.5 - 4.8 g/dL   Globulin, Total 2.0 1.5 - 4.5 g/dL   Albumin/Globulin Ratio 2.2 1.2 - 2.2   Bilirubin Total 0.9 0.0 - 1.2 mg/dL   Alkaline Phosphatase 75 39 - 117 IU/L   AST 19 0 - 40 IU/L   ALT 18 0 - 32 IU/L  Lipid panel     Status: Abnormal   Collection Time: 09/03/16  9:10 AM  Result Value Ref Range   Cholesterol, Total 200 (H) 100 - 199 mg/dL   Triglycerides 59 0 - 149 mg/dL   HDL 77 >39 mg/dL   VLDL Cholesterol Cal 12 5 - 40 mg/dL   LDL Calculated 111 (H) 0 - 99 mg/dL   Chol/HDL Ratio 2.6 0.0 - 4.4 ratio    Comment:                                   T. Chol/HDL Ratio  Men  Women                               1/2 Avg.Risk  3.4    3.3                                   Avg.Risk  5.0    4.4                                2X Avg.Risk  9.6    7.1                                3X Avg.Risk 23.4   11.0   TSH     Status: None   Collection Time: 09/03/16  9:10 AM  Result Value Ref Range   TSH 2.100 0.450 - 4.500 uIU/mL  T3, free     Status: None   Collection Time: 09/03/16  9:10 AM  Result Value Ref Range   T3, Free 3.6 2.0 - 4.4 pg/mL  VITAMIN D 25 Hydroxy (Vit-D Deficiency, Fractures)     Status: None   Collection Time: 09/03/16  9:10 AM  Result Value Ref Range   Vit D, 25-Hydroxy 38.6 30.0 - 100.0 ng/mL    Comment: Vitamin D deficiency has been defined by the [place] and an Endocrine Society practice guideline as a level of serum 25-OH vitamin D less  than 20 ng/mL (1,2). The Endocrine Society went on to further define vitamin D insufficiency as a level between 21 and 29 ng/mL (2). 1. IOM (Institute of Medicine). 2010. Dietary reference    intakes for calcium and D. [place]: The    [employer]. 2. Holick MF, Binkley Latham, Bischoff-Ferrari HA, et al.    Evaluation, treatment, and prevention of vitamin D    deficiency: an Endocrine Society clinical practice    guideline. JCEM. 2011 Jul; 96(7):1911-30.   Vitamin B12     Status: None   Collection Time: 09/03/16  9:10 AM  Result Value Ref Range   Vitamin B-12 698 232 - 1,245 pg/mL  Hemoglobin A1c     Status: None   Collection Time: 09/03/16  9:10 AM  Result Value Ref Range   Hgb A1c MFr Bld 5.5 4.8 - 5.6 %    Comment:          Pre-diabetes: 5.7 - 6.4          Diabetes: >6.4          Glycemic control for adults with diabetes: <7.0    Est. average glucose Bld gHb Est-mCnc 111 mg/dL

## 2016-08-27 NOTE — Patient Instructions (Addendum)

## 2016-09-02 NOTE — Progress Notes (Signed)
71 y.o. [id number] DivorcedCaucasianF here for annual exam.  Doing well.  Staying very busy.  Has had several tick bites this year.    Denies vaginal bleeding.    No LMP recorded. Patient is postmenopausal.          Sexually active: No.  The current method of family planning is post menopausal status.    Exercising: No.  The patient does not participate in regular exercise at present. Smoker:  no  Health Maintenance: Pap:  09/30/14 negative,   01/20/12 negative  History of abnormal Pap:  no MMG:  04/12/16 BIRADS 1 negative  Colonoscopy:  Scheduled for September BMD:   1/17, -1.4, osteopenia TDaP:  06/28/07 Pneumonia vaccine(s):  Prevnar 2017.  Needs Pneumovax. Zostavax: years ago.  Hep C testing: Done with PCP Screening Labs: Did this morning with Dr. Raliegh Scarlet   reports that she quit smoking about 46 years ago. Her smoking use included Cigarettes. She has never used smokeless tobacco. She reports that she drinks alcohol. She reports that she does not use drugs.  Past Medical History:  Diagnosis Date  . Arthritis   . Chest pain   . Herpes   . History of hepatitis    and mononucleosis  . Hypothyroidism   . Mononucleosis 1967  . Multinodular goiter   . Palpitations   . S/P AV nodal ablation 08/05/2011  . SVT (supraventricular tachycardia) ([place])   . Thyroid nodule     Past Surgical History:  Procedure Laterality Date  . BREAST CYST ASPIRATION Bilateral 12/15/2000  . paragard iud     8/90  . SUPRAVENTRICULAR TACHYCARDIA ABLATION N/A 08/05/2011   Procedure: SUPRAVENTRICULAR TACHYCARDIA ABLATION;  Surgeon: Evans Lance, MD;  Location: [facility] CATH LAB;  Service: Cardiovascular;  Laterality: N/A;  . TONSILLECTOMY      Current Outpatient Prescriptions  Medication Sig Dispense Refill  . aspirin EC 81 MG tablet Take 81 mg by mouth daily.    . cholecalciferol (VITAMIN D) 1000 units tablet Take 2,000 Units by mouth daily.    . Flaxseed, Linseed, (FLAX SEEDS PO) Take 30 mLs by mouth daily.     . folic acid (FOLVITE) 098 MCG tablet Take 400 mcg by mouth daily.    . Probiotic Product (PROBIOTIC FORMULA PO) Take 1 capsule by mouth daily. New chapter All-Flora.    . TURMERIC PO Take by mouth daily.     No current facility-administered medications for this visit.     Family History  Problem Relation Age of Onset  . Dementia Mother 71       alive  . Transient ischemic attack Mother        hx of; has a pacemaker  . Hypothyroidism Mother   . Other Father 63       old age. Develop CHF the last 4 months  . Other Sister 34       died of thymoma  . Hypothyroidism Sister   . Other Maternal Aunt        nasal tumor  . Heart attack Maternal Grandfather   . Hypothyroidism Maternal Aunt   . Stroke Paternal Grandfather     ROS:  Pertinent items are noted in HPI.  Otherwise, a comprehensive ROS was negative.  Exam:   BP 128/80 (BP Location: Right Arm, Patient Position: Sitting, Cuff Size: Normal)   Pulse 76   Resp 16   Ht 5\' 3"  (1.6 m)   Wt 172 lb (78 kg)   BMI 30.47 kg/m  Weight change: +14#  Height: 5\' 3"  (160 cm)  Ht Readings from Last 3 Encounters:  09/03/16 5\' 3"  (1.6 m)  08/27/16 5' 3.25" (1.607 m)  09/30/14 5' 3.25" (1.607 m)    General appearance: alert, cooperative and appears stated age Head: Normocephalic, without obvious abnormality, atraumatic Neck: no adenopathy, supple, symmetrical, trachea midline and thyroid normal to inspection and palpation Lungs: clear to auscultation bilaterally Breasts: normal appearance, no masses or tenderness Heart: regular rate and rhythm Abdomen: soft, non-tender; bowel sounds normal; no masses,  no organomegaly Extremities: extremities normal, atraumatic, no cyanosis or edema Skin: Skin color, texture, turgor normal. No rashes or lesions Lymph nodes: Cervical, supraclavicular, and axillary nodes normal. No abnormal inguinal nodes palpated Neurologic: Grossly normal   Pelvic: External genitalia:  no lesions               Urethra:  normal appearing urethra with no masses, tenderness or lesions              Bartholins and Skenes: normal                 Vagina: normal appearing vagina with normal color and discharge, no lesions              Cervix: no lesions              Pap taken: No. Bimanual Exam:  Uterus:  normal size, contour, position, consistency, mobility, non-tender              Adnexa: normal adnexa and no mass, fullness, tenderness               Rectovaginal: Confirms               Anus:  normal sphincter tone, no lesions  Chaperone was present for exam.  A:  Well Woman with normal exam PMP, no HRT H/O enlarged thyroid with nodules with h/o hypothyroidism (off medication now) H/o SVT s/p cardiac ablation  H/O vertigo  P:   Mammogram guidelines reviewed pap smear neg 2016.   Lab work done this morning Rx for pneumonia vaccination and shingles vaccination given return annually or prn

## 2016-09-03 ENCOUNTER — Other Ambulatory Visit (INDEPENDENT_AMBULATORY_CARE_PROVIDER_SITE_OTHER): Payer: Medicare Other

## 2016-09-03 ENCOUNTER — Encounter: Payer: Self-pay | Admitting: Obstetrics & Gynecology

## 2016-09-03 ENCOUNTER — Ambulatory Visit (INDEPENDENT_AMBULATORY_CARE_PROVIDER_SITE_OTHER): Payer: Medicare Other | Admitting: Obstetrics & Gynecology

## 2016-09-03 VITALS — BP 128/80 | HR 76 | Resp 16 | Ht 63.0 in | Wt 172.0 lb

## 2016-09-03 DIAGNOSIS — Z124 Encounter for screening for malignant neoplasm of cervix: Secondary | ICD-10-CM | POA: Diagnosis not present

## 2016-09-03 DIAGNOSIS — Z131 Encounter for screening for diabetes mellitus: Secondary | ICD-10-CM

## 2016-09-03 DIAGNOSIS — I471 Supraventricular tachycardia: Secondary | ICD-10-CM | POA: Diagnosis not present

## 2016-09-03 DIAGNOSIS — R5383 Other fatigue: Secondary | ICD-10-CM

## 2016-09-03 DIAGNOSIS — R55 Syncope and collapse: Secondary | ICD-10-CM | POA: Diagnosis not present

## 2016-09-03 DIAGNOSIS — E049 Nontoxic goiter, unspecified: Secondary | ICD-10-CM

## 2016-09-03 DIAGNOSIS — Z01419 Encounter for gynecological examination (general) (routine) without abnormal findings: Secondary | ICD-10-CM

## 2016-09-03 DIAGNOSIS — R002 Palpitations: Secondary | ICD-10-CM

## 2016-09-03 NOTE — Addendum Note (Signed)
Addended by: Lanier Prude D on: 09/03/2016 09:17 AM   Modules accepted: Orders

## 2016-09-04 LAB — CBC WITH DIFFERENTIAL/PLATELET
Basophils Absolute: 0 10*3/uL (ref 0.0–0.2)
Basos: 0 %
EOS (ABSOLUTE): 0.2 10*3/uL (ref 0.0–0.4)
Eos: 5 %
Hematocrit: 39 % (ref 34.0–46.6)
Hemoglobin: 12.9 g/dL (ref 11.1–15.9)
Immature Grans (Abs): 0 10*3/uL (ref 0.0–0.1)
Immature Granulocytes: 0 %
Lymphocytes Absolute: 1.5 10*3/uL (ref 0.7–3.1)
Lymphs: 31 %
MCH: 30.4 pg (ref 26.6–33.0)
MCHC: 33.1 g/dL (ref 31.5–35.7)
MCV: 92 fL (ref 79–97)
Monocytes Absolute: 0.6 10*3/uL (ref 0.1–0.9)
Monocytes: 12 %
Neutrophils Absolute: 2.5 10*3/uL (ref 1.4–7.0)
Neutrophils: 52 %
Platelets: 248 10*3/uL (ref 150–379)
RBC: 4.25 x10E6/uL (ref 3.77–5.28)
RDW: 13.4 % (ref 12.3–15.4)
WBC: 4.8 10*3/uL (ref 3.4–10.8)

## 2016-09-04 LAB — COMPREHENSIVE METABOLIC PANEL
ALT: 18 IU/L (ref 0–32)
AST: 19 IU/L (ref 0–40)
Albumin/Globulin Ratio: 2.2 (ref 1.2–2.2)
Albumin: 4.3 g/dL (ref 3.5–4.8)
Alkaline Phosphatase: 75 IU/L (ref 39–117)
BUN/Creatinine Ratio: 13 (ref 12–28)
BUN: 12 mg/dL (ref 8–27)
Bilirubin Total: 0.9 mg/dL (ref 0.0–1.2)
CO2: 24 mmol/L (ref 20–29)
Calcium: 9.2 mg/dL (ref 8.7–10.3)
Chloride: 106 mmol/L (ref 96–106)
Creatinine, Ser: 0.89 mg/dL (ref 0.57–1.00)
GFR calc Af Amer: 76 mL/min/{1.73_m2} (ref >59)
GFR calc non Af Amer: 66 mL/min/{1.73_m2} (ref >59)
Globulin, Total: 2 g/dL (ref 1.5–4.5)
Glucose: 89 mg/dL (ref 65–99)
Potassium: 4.3 mmol/L (ref 3.5–5.2)
Sodium: 142 mmol/L (ref 134–144)
Total Protein: 6.3 g/dL (ref 6.0–8.5)

## 2016-09-04 LAB — LIPID PANEL
Chol/HDL Ratio: 2.6 ratio (ref 0.0–4.4)
Cholesterol, Total: 200 mg/dL — ABNORMAL HIGH (ref 100–199)
HDL: 77 mg/dL (ref >39)
LDL Calculated: 111 mg/dL — ABNORMAL HIGH (ref 0–99)
Triglycerides: 59 mg/dL (ref 0–149)
VLDL Cholesterol Cal: 12 mg/dL (ref 5–40)

## 2016-09-04 LAB — HEMOGLOBIN A1C
Est. average glucose Bld gHb Est-mCnc: 111 mg/dL
Hgb A1c MFr Bld: 5.5 % (ref 4.8–5.6)

## 2016-09-04 LAB — T3, FREE: T3, Free: 3.6 pg/mL (ref 2.0–4.4)

## 2016-09-04 LAB — TSH: TSH: 2.1 u[IU]/mL (ref 0.450–4.500)

## 2016-09-04 LAB — VITAMIN D 25 HYDROXY (VIT D DEFICIENCY, FRACTURES): Vit D, 25-Hydroxy: 38.6 ng/mL (ref 30.0–100.0)

## 2016-09-04 LAB — VITAMIN B12: Vitamin B-12: 698 pg/mL (ref 232–1245)

## 2016-10-01 ENCOUNTER — Ambulatory Visit: Payer: Self-pay | Admitting: Family Medicine

## 2016-10-08 ENCOUNTER — Ambulatory Visit: Payer: Medicare Other

## 2016-10-08 ENCOUNTER — Ambulatory Visit (INDEPENDENT_AMBULATORY_CARE_PROVIDER_SITE_OTHER): Payer: Medicare Other | Admitting: Family Medicine

## 2016-10-08 ENCOUNTER — Encounter: Payer: Self-pay | Admitting: Family Medicine

## 2016-10-08 ENCOUNTER — Other Ambulatory Visit (HOSPITAL_COMMUNITY): Payer: Self-pay

## 2016-10-08 VITALS — BP 132/79 | HR 89 | Ht 63.0 in | Wt 171.1 lb

## 2016-10-08 DIAGNOSIS — M25561 Pain in right knee: Secondary | ICD-10-CM

## 2016-10-08 DIAGNOSIS — E669 Obesity, unspecified: Secondary | ICD-10-CM

## 2016-10-08 DIAGNOSIS — M25562 Pain in left knee: Secondary | ICD-10-CM | POA: Diagnosis not present

## 2016-10-08 DIAGNOSIS — M159 Polyosteoarthritis, unspecified: Secondary | ICD-10-CM | POA: Diagnosis not present

## 2016-10-08 DIAGNOSIS — G8929 Other chronic pain: Secondary | ICD-10-CM | POA: Insufficient documentation

## 2016-10-08 DIAGNOSIS — R32 Unspecified urinary incontinence: Secondary | ICD-10-CM

## 2016-10-08 NOTE — Progress Notes (Signed)
Impression and Recommendations:    1. Urinary incontinence, unspecified type   2. Acute pain of right knee   3. Class 1 obesity in adult, unspecified BMI, unspecified obesity type, unspecified whether serious comorbidity present    1. ---> Patient will let me know if she would like urology referral in the future. She will try kegel's first for 1-2 mo, then let us know. See handout on Kegel exercises that I printed off for you from [facility]  2. - I will call and let you know what the radiologist says if it's different than what we discussed during our office visit.   Rice principles discussed with patient, avoid aggravating activities, x-rays today show right knee medial compartment compression and degenerative changes  --> F/up for knee injection R near future    No problem-specific Assessment & Plan notes found for this encounter.   The patient was counseled, risk factors were discussed, anticipatory guidance given.   New Prescriptions   No medications on file     Discontinued Medications   No medications on file     Modified Medications   No medications on file      Orders Placed This Encounter  Procedures  . DG Knee Bilateral Standing AP  . DG Knee 1-2 Views Right     Gross side effects, risk and benefits, and alternatives of medications and treatment plan in general discussed with patient.  Patient is aware that all medications have potential side effects and we are unable to predict every side effect or drug-drug interaction that may occur.   Patient will call with any questions prior to using medication if they have concerns.  Expresses verbal understanding and consents to current therapy and treatment regimen.  No barriers to understanding were identified.  Red flag symptoms and signs discussed in detail.  Patient expressed understanding regarding what to do in case of emergency\urgent symptoms  Please see AVS handed out to patient at the end of our  visit for further patient instructions/ counseling done pertaining to today's office visit.   Return for f/up appt reassess knee / possibly inject R knee.     Note: This document was prepared using Dragon voice recognition software and may include unintentional dictation errors.  Mellody Dance 3:08 PM --------------------------------------------------------------------------------------------------------------------------------------------------------------------------------------------------------------------------------------------    Subjective:    CC:  Chief Complaint  Patient presents with  . Follow-up    HPI: Lindsay [name] is a 71 y.o. female who presents to [place] [facility] today for issues as discussed below.  1. Patient referred to gastroenterology back on 6\29\18.  Pt will be calling them to schedule an appt.  She was too busy to schedule in August or September.  She is looking for an appointment in October  2. Knees b/L w than hips today.  Yrs and yrs has been bothering her.  Mom with hip and knee replacements. No debilitating.  Struggles with stairs the most, squatting is hard and did a lot of crawling on knees in past job.   R knee W than L.   Medial aspect.   Cleans houses now.  Pt occ takes advil, no ice or anything.  Never tried PT for knees.     3. Still with urinary urgency issue that she has had for yrs- but never got help with that.   No problem with coughing or sneezing.   3-4 times nightly urinating.   Just pees a lot- every  hour at times.  Occ leaks.     Never tried meds for it b/c she doesn't like to take any.     Problem  Urinary Incontinence  Obesity  Vegan Diet   45 yrs now    Paroxysmal SVT (supraventricular tachycardia) (HCC)   S/p ablation in 2014--> no  Prob since; doesn't follow with Cards.    Palpitations  Thyroid Goiter   Dr Chalmers Cater-- Endo--> seeing her for yrs.   Euthyroid;   [place] on goiter every yr.     Acute  Pain of Right Knee  h/o Near syncope  h/o chronic Dizziness     Wt Readings from Last 3 Encounters:  10/08/16 171 lb 1.6 oz (77.6 kg)  09/03/16 172 lb (78 kg)  08/27/16 173 lb (78.5 kg)   BP Readings from Last 3 Encounters:  10/08/16 132/79  09/03/16 128/80  08/27/16 120/80   Pulse Readings from Last 3 Encounters:  10/08/16 89  09/03/16 76  08/27/16 71   BMI Readings from Last 3 Encounters:  10/08/16 30.31 kg/m  09/03/16 30.47 kg/m  08/27/16 30.40 kg/m     Patient Care Team    Relationship Specialty Notifications Start End  Mellody Dance, DO PCP - General Family Medicine  08/27/16   Adrian Prows, [place] Physician Cardiology  08/27/16   Evans Lance, MD Consulting Physician Cardiology  08/27/16   Juanita Craver, MD Consulting Physician Gastroenterology  08/27/16   Jacelyn Pi, MD Consulting Physician Endocrinology  08/27/16   Macarthur Critchley, [place] Referring Physician Optometry  08/27/16   Darleen Crocker, MD Consulting Physician Ophthalmology  08/27/16   Linton Rump, PT Physical Therapist Physical Therapy  08/27/16   Megan Salon, MD Consulting Physician Gynecology  08/27/16      Patient Active Problem List   Diagnosis Date Noted  . Urinary incontinence 10/08/2016    Priority: High  . Obesity 08/26/2010    Priority: High  . Vegan diet 08/27/2016    Priority: Medium  . Paroxysmal SVT (supraventricular tachycardia) ([place]) 10/06/2010    Priority: Medium  . Palpitations 08/26/2010    Priority: Medium  . Thyroid goiter 08/27/2016    Priority: Low  . Acute pain of right knee 10/08/2016  . h/o Near syncope 08/20/2013  . h/o chronic Dizziness 08/20/2013  . Pain in joint, shoulder region 09/26/2012  . Biceps tendinopathy 09/26/2012  . Right wrist pain 09/26/2012    Past Medical history, Surgical history, Family history, Social history, Allergies and Medications have been entered into the medical record, reviewed and changed as needed.    Current Meds    Medication Sig  . aspirin EC 81 MG tablet Take 81 mg by mouth daily.  . cholecalciferol (VITAMIN D) 1000 units tablet Take 2,000 Units by mouth daily.  . Flaxseed, Linseed, (FLAX SEEDS PO) Take 30 mLs by mouth daily.  . folic acid (FOLVITE) 124 MCG tablet Take 400 mcg by mouth daily.  . Probiotic Product (PROBIOTIC FORMULA PO) Take 1 capsule by mouth daily. New chapter All-Flora.  . TURMERIC PO Take by mouth daily.    Allergies:  No Known Allergies   Review of Systems: General:   Denies fever, chills, unexplained weight loss.  Optho/Auditory:   Denies visual changes, blurred vision/LOV Respiratory:   Denies wheeze, DOE more than baseline levels.  Cardiovascular:   Denies chest pain, palpitations, new onset peripheral edema  Gastrointestinal:   Denies nausea, vomiting, diarrhea, abd pain.  Genitourinary: Denies dysuria, freq/ urgency, flank pain or  discharge from genitals.  Endocrine:     Denies hot or cold intolerance, polyuria, polydipsia. Musculoskeletal:   Denies unexplained myalgias, joint swelling, unexplained arthralgias, gait problems.  Skin:  Denies new onset rash, suspicious lesions Neurological:     Denies dizziness, unexplained weakness, numbness  Psychiatric/Behavioral:   Denies mood changes, suicidal or homicidal ideations, hallucinations    Objective:   Blood pressure 132/79, pulse 89, height 5\' 3"  (1.6 m), weight 171 lb 1.6 oz (77.6 kg). Body mass index is 30.31 kg/m. General:  Well Developed, well nourished, appropriate for stated age.  Neuro:  Alert and oriented,  extra-ocular muscles intact  HEENT:  Normocephalic, atraumatic, neck supple, no carotid bruits appreciated  Skin:  no gross rash, warm, pink. Knee: Normal to inspection with no erythema or obvious bony abnormalities.  ++  Mild joint effusion present. Palpation normal with no warmth, patellar tenderness, or condyle tenderness. ++  Mild to moderate medial joint line tenderness ROM full in flexion  and extension and lower leg rotation - with ++ crepitus. Ligaments with solid endpoint including ACL, PCL, LCL, MCL. Negative Mcmurray's, Appley's. Non painful patellar compression. ++  Patellar glide with crepitus. Patellar and quadriceps tendons unremarkable. Hamstring and quadriceps strength is normal.  Vascular:  Ext warm, no cyanosis apprec.; cap RF less 2 sec. Psych:  No HI/SI, judgement and insight good, Euthymic mood. Full Affect.

## 2016-10-08 NOTE — Patient Instructions (Addendum)
See handout on Kegel exercises that I printed off for you from [facility]  - I will call and let you know what the radiologist says if it's different than what we discussed during our office visit.     --> F/up for knee injection R near future  ---> Patient will let me know if she would like urology referral in the future. She will try kegel's first for 1-2 mo, then let us know.    Patellofemoral Pain Syndrome Patellofemoral pain syndrome is a condition that involves a softening or breakdown of the tissue (cartilage) on the underside of your kneecap (patella). This causes pain in the front of the knee. The condition is also called runner's knee or chondromalacia patella. Patellofemoral pain syndrome is most common in young adults who are active in sports. Your knee is the largest joint in your body. The patella covers the front of your knee and is attached to muscles above and below your knee. The underside of the patella is covered with a smooth type of cartilage (synovium). The smooth surface helps the patella glide easily when you move your knee. Patellofemoral pain syndrome causes swelling in the joint linings and bone surfaces in your knee. What are the causes? Patellofemoral pain syndrome can be caused by:  Overuse.  Poor alignment of your knee joints.  Weak leg muscles.  A direct blow to your kneecap.  What increases the risk? You may be at risk for patellofemoral pain syndrome if you:  Do a lot of activities that can wear down your kneecap. These include: ? Running. ? Squatting. ? Climbing stairs.  Start a new physical activity or exercise program.  Wear shoes that do not fit well.  Do not have good leg strength.  Are overweight.  What are the signs or symptoms? Knee pain is the most common symptom of patellofemoral pain syndrome. This may feel like a dull, aching pain underneath your patella, in the front of your knee. There may be a popping or cracking sound when  you move your knee. Pain may get worse with:  Exercise.  Climbing stairs.  Running.  Jumping.  Squatting.  Kneeling.  Sitting for a long time.  Moving or pushing on your patella.  How is this diagnosed? Your health care provider may be able to diagnose patellofemoral pain syndrome from your symptoms and medical history. You may be asked about your recent physical activities and which ones cause knee pain. Your health care provider may do a physical exam with certain tests to confirm the diagnosis. These may include:  Moving your patella back and forth.  Checking your range of knee motion.  Having you squat or jump to see if you have pain.  Checking the strength of your leg muscles.  An MRI of the knee may also be done. How is this treated? Patellofemoral pain syndrome can usually be treated at home with rest, ice, compression, and elevation (RICE). Other treatments may include:  Nonsteroidal anti-inflammatory drugs (NSAIDs).  Physical therapy to stretch and strengthen your leg muscles.  Shoe inserts (orthotics) to take stress off your knee.  A knee brace or knee support.   Follow these instructions at home:  Take medicines only as directed by your health care provider.  Rest your knee. ? When resting, keep your knee raised above the level of your heart. ? Avoid activities that cause knee pain.  Apply ice to the injured area: ? Put ice in a plastic bag. ? Place a  towel between your skin and the bag. ? Leave the ice on for 20 minutes, 2-3 times a day.  Use splints, braces, knee supports, or walking aids as directed by your health care provider.  Perform stretching and strengthening exercises as directed by your health care provider or physical therapist.  Keep all follow-up visits as directed by your health care provider. This is important. Contact a health care provider if:  Your symptoms get worse.  You are not improving with home care. This  information is not intended to replace advice given to you by your health care provider. Make sure you discuss any questions you have with your health care provider.  Document Released: 02/03/2009 Document Revised: 07/24/2015 Document Reviewed: 05/07/2013 Elsevier Interactive Patient Education  2018 [employer].

## 2016-10-15 ENCOUNTER — Ambulatory Visit: Payer: Self-pay | Admitting: Family Medicine

## 2016-10-18 ENCOUNTER — Encounter: Payer: Self-pay | Admitting: Family Medicine

## 2016-10-18 ENCOUNTER — Ambulatory Visit: Payer: Medicare Other | Admitting: Family Medicine

## 2016-10-18 VITALS — BP 133/78 | HR 80 | Ht 63.0 in | Wt 172.4 lb

## 2016-10-18 DIAGNOSIS — G8929 Other chronic pain: Secondary | ICD-10-CM

## 2016-10-18 DIAGNOSIS — M25561 Pain in right knee: Secondary | ICD-10-CM | POA: Diagnosis not present

## 2016-10-18 DIAGNOSIS — M25562 Pain in left knee: Secondary | ICD-10-CM

## 2016-10-18 DIAGNOSIS — M1711 Unilateral primary osteoarthritis, right knee: Secondary | ICD-10-CM

## 2016-10-18 DIAGNOSIS — M170 Bilateral primary osteoarthritis of knee: Secondary | ICD-10-CM

## 2016-10-18 MED ORDER — LIDOCAINE HCL 2 % IJ SOLN: 1.0000 mL | Freq: Once | INTRAMUSCULAR | Status: DC

## 2016-10-18 MED ORDER — METHYLPREDNISOLONE ACETATE 40 MG/ML IJ SUSP: 40.0000 mg | Freq: Once | INTRAMUSCULAR | Status: DC

## 2016-10-18 MED ORDER — BUPIVACAINE HCL (PF) 0.5 % IJ SOLN: 2.0000 mL | Freq: Once | INTRAMUSCULAR | Status: DC

## 2016-10-18 NOTE — Patient Instructions (Addendum)
-   Discussed the patient to ice 15-20 minutes daily 3-4 times per day  - She'll follow-up in 2 weeks for possible injection L knee

## 2016-10-18 NOTE — Progress Notes (Signed)
Impression and Recommendations:    The patient was counselled, risk factors were discussed, anticipatory guidance given.   1. Primary osteoarthritis of right knee   2. Chronic pain of both knees   3. Primary osteoarthritis of both knees     KNEE PAIN:  - We discussed PT - referral held off for now as patient wants to pursue her own physical therapy referral.  She sees a Acupuncturist at [facility] therapy.   She will call us and inform us if she needs a referral.  Discussed the risks benefits of this with patient and importance of strengthening muscles around the knee area regardless of pathology. - X-ray right and bilateral knees-reviewed with patient showed mild degenerative joint disease medially specifically.     - Procedure:   -Injection of R knee -Performed by Dr. Thomasene Lot  -Consent obtained after discussion of risks benefits and alternatives of procedure; patient wished to proceed. -Time-out conducted. -Noted no overlying erythema, warmth, induration, or other signs of local infection. -Skin prepped in a sterile fashion. -Topical analgesic spray: Ethyl chloride/ FreezEase. Completed without complication or difficulty.  Medication injected without resistance after negative aspiration.  Patient tolerated well.  EBL: Less than 0.1 mL Meds: 1cc 2% lidocaine, 2 mL 0.5% bupivacaine, 1 mL Depo-Medrol 40 mg per mL Injection site dressed with sterile bandage. -Pain immediately improved by over 50%, suggesting accurate placement of the medication. - Ice 15-20 minutes 3-4 times per day for the next 48 hours and any time after repetitive activity thereafter Advised to call if fevers/chills, erythema, induration, drainage, or bleeding.   Follow-up as discussed  - NSAIDS when necessary; risk benefits discussed with patient. - ICE for 15-20 minutes, 3-4 times per day. - pt understands may need MRI if knee continues to be in pain   Return in about 2 weeks (around 11/01/2016), or  if symptoms worsen or fail to improve, for For possible injection left knee.  Please see AVS handed out to patient at the end of our visit for further patient instructions/ counseling done pertaining to today's office visit.  Note:  This document was prepared using Dragon voice recognition software and may include unintentional dictation errors.    Subjective   Patient Care Team    Relationship Specialty Notifications Start End  Thomasene Lot, DO PCP - General Family Medicine  08/27/16   Yates Decamp, MD Consulting Physician Cardiology  08/27/16   Marinus Maw, MD Consulting Physician Cardiology  08/27/16   Charna Elizabeth, MD Consulting Physician Gastroenterology  08/27/16   Dorisann Frames, MD Consulting Physician Endocrinology  08/27/16   Fredrich Birks, OD Referring Physician Optometry  08/27/16   Mia Creek, MD Consulting Physician Ophthalmology  08/27/16   Beryle Flock, PT Physical Therapist Physical Therapy  08/27/16   Jerene Bears, MD Consulting Physician Gynecology  08/27/16      Past Medical History:  Diagnosis Date   Arthritis    Chest pain    Herpes    History of hepatitis    and mononucleosis   Hypothyroidism    Mononucleosis 1967   Multinodular goiter    Palpitations    S/P AV nodal ablation 08/05/2011   SVT (supraventricular tachycardia) (HCC)    Thyroid nodule     Past Surgical History:  Procedure Laterality Date   BREAST CYST ASPIRATION Bilateral 12/15/2000   paragard iud     8/90   SUPRAVENTRICULAR TACHYCARDIA ABLATION N/A 08/05/2011   Procedure: SUPRAVENTRICULAR TACHYCARDIA  ABLATION;  Surgeon: Marinus Maw, MD;  Location: [facility] CATH LAB;  Service: Cardiovascular;  Laterality: N/A;   TONSILLECTOMY      @HXFAM @  History  Drug Use No  ,  History  Alcohol Use   0.0 oz/week    Comment: occasional  ,  History  Smoking Status   Former Smoker   Types: Cigarettes   Quit date: 03/01/1970  Smokeless Tobacco   Never Used  ,  History  Sexual Activity    Sexual activity: No    Patient's Medications  New Prescriptions   No medications on file  Previous Medications   ASPIRIN EC 81 MG TABLET    Take 81 mg by mouth daily.   CHOLECALCIFEROL (VITAMIN D) 1000 UNITS TABLET    Take 2,000 Units by mouth daily.   FLAXSEED, LINSEED, (FLAX SEEDS PO)    Take 30 mLs by mouth daily.   FOLIC ACID (FOLVITE) 800 MCG TABLET    Take 400 mcg by mouth daily.   PROBIOTIC PRODUCT (PROBIOTIC FORMULA PO)    Take 1 capsule by mouth daily. New chapter All-Flora.   TURMERIC PO    Take by mouth daily.  Modified Medications   No medications on file  Discontinued Medications   No medications on file    Patient has no known allergies.  Scheduled Meds: Continuous Infusions: PRN Meds:.  Review of Systems: General:   Denies fever, chills, unexplained weight loss.  Optho/Auditory:   Denies visual changes, blurred vision/LOV Respiratory:   Denies SOB, DOE more than baseline levels.   Cardiovascular:   Denies chest pain, palpitations, new onset peripheral edema  Gastrointestinal:   Denies nausea, vomiting, diarrhea.  Genitourinary: Denies dysuria, freq/ urgency, flank pain or discharge from genitals.  Endocrine:     Denies hot or cold intolerance, polyuria, polydipsia. Musculoskeletal:   See above in HPI  Skin:  Denies rash, suspicious lesions Neurological:     Denies dizziness, unexplained weakness, numbness  Psychiatric/Behavioral:   Denies mood changes, suicidal or homicidal ideations, hallucinations   Objective:   Blood pressure 133/78, pulse 80, height 5\' 3"  (1.6 m), weight 172 lb 6.4 oz (78.2 kg). Body mass index is 30.54 kg/m. General: Well Developed, well nourished, and in no acute distress.  Neuro: Alert and oriented x3, extra-ocular muscles intact, sensation grossly intact.  HEENT: Normocephalic, atraumatic, pupils equal round reactive to light, neck supple Skin: no gross suspicious lesions or rashes  Cardiac: Regular rate and rhythm, no murmurs  rubs or gallops.  Respiratory: Essentially clear to auscultation bilaterally. Not using accessory muscles, speaking in full sentences.  Abdominal: Soft, not grossly distended Musculoskeletal: Ambulates w/o diff, FROM * 4 ext.  R Knee:  Scant amount peripatellar swelling o/w essential normal exam; no gross instability to posterior or anterior drawer test; negative varus stress test; no discomfort on valgus stress test FROM with mild crepitus, mild soft tissue swelling posterior knee and anterior jt space,, no ecchymosis, normal contralateral knee exam. Neurovascularly intact distally. No pain upon quick palpation of the ipsilateral hip and ankle joints. Vasc: less 2 sec cap RF, warm and pink  Psych: No HI/SI, judgement and insight good, Euthymic mood. Full Affect.

## 2016-10-25 ENCOUNTER — Encounter: Payer: Self-pay | Admitting: Family Medicine

## 2016-10-25 ENCOUNTER — Ambulatory Visit: Payer: Medicare Other | Admitting: Family Medicine

## 2016-10-25 VITALS — BP 128/76 | HR 60 | Ht 63.0 in | Wt 175.2 lb

## 2016-10-25 DIAGNOSIS — M170 Bilateral primary osteoarthritis of knee: Secondary | ICD-10-CM | POA: Diagnosis not present

## 2016-10-25 DIAGNOSIS — M25561 Pain in right knee: Secondary | ICD-10-CM

## 2016-10-25 DIAGNOSIS — G8929 Other chronic pain: Secondary | ICD-10-CM

## 2016-10-25 DIAGNOSIS — M25562 Pain in left knee: Secondary | ICD-10-CM

## 2016-10-25 MED ORDER — LIDOCAINE HCL 2 % IJ SOLN: 1.0000 mL | Freq: Once | INTRAMUSCULAR | Status: AC

## 2016-10-25 MED ORDER — BUPIVACAINE HCL (PF) 0.5 % IJ SOLN: 2.0000 mL | Freq: Once | INTRAMUSCULAR | Status: AC

## 2016-10-25 MED ORDER — METHYLPREDNISOLONE ACETATE 40 MG/ML IJ SUSP: 40.0000 mg | Freq: Once | INTRAMUSCULAR | Status: AC

## 2016-10-25 NOTE — Patient Instructions (Signed)
we will see how well these knee injections help your hip pain. After  a month or 2 if you are still having some significant pain in the hip, please let us know and we can bring you in for further eval or refer you to sports med.   Hip Pain The hip is the joint between the upper legs and the lower pelvis. The bones, cartilage, tendons, and muscles of your hip joint support your body and allow you to move around. Hip pain can range from a minor ache to severe pain in one or both of your hips. The pain may be felt on the inside of the hip joint near the groin, or the outside near the buttocks and upper thigh. You may also have swelling or stiffness. Follow these instructions at home: Managing pain, stiffness, and swelling  If directed, apply ice to the injured area. ? Put ice in a plastic bag. ? Place a towel between your skin and the bag. ? Leave the ice on for 20 minutes, 2-3 times a day  Sleep with a pillow between your legs on your most comfortable side.  Avoid any activities that cause pain. General instructions  Take over-the-counter and prescription medicines only as told by your health care provider.  Do any exercises as told by your health care provider.  Record the following: ? How often you have hip pain. ? The location of your pain. ? What the pain feels like. ? What makes the pain worse.  Keep all follow-up visits as told by your health care provider. This is important. Contact a health care provider if:  You cannot put weight on your leg.  Your pain or swelling continues or gets worse after one week.  It gets harder to walk.  You have a fever. Get help right away if:  You fall.  You have a sudden increase in pain and swelling in your hip.  Your hip is red or swollen or very tender to touch. Summary  Hip pain can range from a minor ache to severe pain in one or both of your hips.  The pain may be felt on the inside of the hip joint near the groin, or the  outside near the buttocks and upper thigh.  Avoid any activities that cause pain.  Record how often you have hip pain, the location of the pain, what makes it worse and what it feels like. This information is not intended to replace advice given to you by your health care provider. Make sure you discuss any questions you have with your health care provider. Document Released: 08/05/2009 Document Revised: 01/19/2016 Document Reviewed: 01/19/2016 Elsevier Interactive Patient Education  2017 [employer]. Knee Exercises Ask your health care provider which exercises are safe for you. Do exercises exactly as told by your health care provider and adjust them as directed. It is normal to feel mild stretching, pulling, tightness, or discomfort as you do these exercises, but you should stop right away if you feel sudden pain or your pain gets worse.Do not begin these exercises until told by your health care provider. STRETCHING AND RANGE OF MOTION EXERCISES These exercises warm up your muscles and joints and improve the movement and flexibility of your knee. These exercises also help to relieve pain, numbness, and tingling. Exercise A: Knee Extension, Prone 1. Lie on your abdomen on a bed. 2. Place your left / right knee just beyond the edge of the surface so your knee is not  on the bed. You can put a towel under your left / right thigh just above your knee for comfort. 3. Relax your leg muscles and allow gravity to straighten your knee. You should feel a stretch behind your left / right knee. 4. Hold this position for __________ seconds. 5. Scoot up so your knee is supported between repetitions. Repeat __________ times. Complete this stretch __________ times a day. Exercise B: Knee Flexion, Active  1. Lie on your back with both knees straight. If this causes back discomfort, bend your left / right knee so your foot is flat on the floor. 2. Slowly slide your left / right heel back toward your buttocks  until you feel a gentle stretch in the front of your knee or thigh. 3. Hold this position for __________ seconds. 4. Slowly slide your left / right heel back to the starting position. Repeat __________ times. Complete this exercise __________ times a day. Exercise C: Quadriceps, Prone  1. Lie on your abdomen on a firm surface, such as a bed or padded floor. 2. Bend your left / right knee and hold your ankle. If you cannot reach your ankle or pant leg, loop a belt around your foot and grab the belt instead. 3. Gently pull your heel toward your buttocks. Your knee should not slide out to the side. You should feel a stretch in the front of your thigh and knee. 4. Hold this position for __________ seconds. Repeat __________ times. Complete this stretch __________ times a day. Exercise D: Hamstring, Supine 1. Lie on your back. 2. Loop a belt or towel over the ball of your left / right foot. The ball of your foot is on the walking surface, right under your toes. 3. Straighten your left / right knee and slowly pull on the belt to raise your leg until you feel a gentle stretch behind your knee. ? Do not let your left / right knee bend while you do this. ? Keep your other leg flat on the floor. 4. Hold this position for __________ seconds. Repeat __________ times. Complete this stretch __________ times a day. STRENGTHENING EXERCISES These exercises build strength and endurance in your knee. Endurance is the ability to use your muscles for a long time, even after they get tired. Exercise E: Quadriceps, Isometric  1. Lie on your back with your left / right leg extended and your other knee bent. Put a rolled towel or small pillow under your knee if told by your health care provider. 2. Slowly tense the muscles in the front of your left / right thigh. You should see your kneecap slide up toward your hip or see increased dimpling just above the knee. This motion will push the back of the knee toward the  floor. 3. For __________ seconds, keep the muscle as tight as you can without increasing your pain. 4. Relax the muscles slowly and completely. Repeat __________ times. Complete this exercise __________ times a day. Exercise F: Straight Leg Raises - Quadriceps 1. Lie on your back with your left / right leg extended and your other knee bent. 2. Tense the muscles in the front of your left / right thigh. You should see your kneecap slide up or see increased dimpling just above the knee. Your thigh may even shake a bit. 3. Keep these muscles tight as you raise your leg 4-6 inches (10-15 cm) off the floor. Do not let your knee bend. 4. Hold this position for __________ seconds. 5. Keep  these muscles tense as you lower your leg. 6. Relax your muscles slowly and completely after each repetition. Repeat __________ times. Complete this exercise __________ times a day. Exercise G: Hamstring, Isometric 1. Lie on your back on a firm surface. 2. Bend your left / right knee approximately __________ degrees. 3. Dig your left / right heel into the surface as if you are trying to pull it toward your buttocks. Tighten the muscles in the back of your thighs to dig as hard as you can without increasing any pain. 4. Hold this position for __________ seconds. 5. Release the tension gradually and allow your muscles to relax completely for __________ seconds after each repetition. Repeat __________ times. Complete this exercise __________ times a day. Exercise H: Hamstring Curls  If told by your health care provider, do this exercise while wearing ankle weights. Begin with __________ weights. Then increase the weight by 1 lb (0.5 kg) increments. Do not wear ankle weights that are more than __________. 1. Lie on your abdomen with your legs straight. 2. Bend your left / right knee as far as you can without feeling pain. Keep your hips flat against the floor. 3. Hold this position for __________ seconds. 4. Slowly  lower your leg to the starting position.  Repeat __________ times. Complete this exercise __________ times a day. Exercise I: Squats (Quadriceps) 1. Stand in front of a table, with your feet and knees pointing straight ahead. You may rest your hands on the table for balance but not for support. 2. Slowly bend your knees and lower your hips like you are going to sit in a chair. ? Keep your weight over your heels, not over your toes. ? Keep your lower legs upright so they are parallel with the table legs. ? Do not let your hips go lower than your knees. ? Do not bend lower than told by your health care provider. ? If your knee pain increases, do not bend as low. 3. Hold the squat position for __________ seconds. 4. Slowly push with your legs to return to standing. Do not use your hands to pull yourself to standing. Repeat __________ times. Complete this exercise __________ times a day. Exercise J: Wall Slides (Quadriceps)  1. Lean your back against a smooth wall or door while you walk your feet out 18-24 inches (46-61 cm) from it. 2. Place your feet hip-width apart. 3. Slowly slide down the wall or door until your knees bend __________ degrees. Keep your knees over your heels, not over your toes. Keep your knees in line with your hips. 4. Hold for __________ seconds. Repeat __________ times. Complete this exercise __________ times a day. Exercise K: Straight Leg Raises - Hip Abductors 1. Lie on your side with your left / right leg in the top position. Lie so your head, shoulder, knee, and hip line up. You may bend your bottom knee to help you keep your balance. 2. Roll your hips slightly forward so your hips are stacked directly over each other and your left / right knee is facing forward. 3. Leading with your heel, lift your top leg 4-6 inches (10-15 cm). You should feel the muscles in your outer hip lifting. ? Do not let your foot drift forward. ? Do not let your knee roll toward the  ceiling. 4. Hold this position for __________ seconds. 5. Slowly return your leg to the starting position. 6. Let your muscles relax completely after each repetition. Repeat __________ times. Complete this exercise  __________ times a day. Exercise L: Straight Leg Raises - Hip Extensors 1. Lie on your abdomen on a firm surface. You can put a pillow under your hips if that is more comfortable. 2. Tense the muscles in your buttocks and lift your left / right leg about 4-6 inches (10-15 cm). Keep your knee straight as you lift your leg. 3. Hold this position for __________ seconds. 4. Slowly lower your leg to the starting position. 5. Let your leg relax completely after each repetition. Repeat __________ times. Complete this exercise __________ times a day. This information is not intended to replace advice given to you by your health care provider. Make sure you discuss any questions you have with your health care provider. Document Released: 12/30/2004 Document Revised: 11/10/2015 Document Reviewed: 12/22/2014 Elsevier Interactive Patient Education  2018 [employer].

## 2016-10-25 NOTE — Progress Notes (Signed)
Impression and Recommendations:    1. Chronic pain of left knee   2. Chronic pain of both knees   3. Primary osteoarthritis of both knees    KNEE PAIN:  - We discussed PT - referral held off for now as patient wants to pursue her own physical therapy referral.  She sees a Optician, dispensing at [facility] therapy.    - She will call us and inform us if she needs a referral.  Discussed the risks benefits of this with patient and importance of strengthening muscles around the knee area regardless of pathology. - X-ray bilateral knees-reviewed with patient showed mild degenerative joint disease medially specifically.     - Procedure:   -Injection of L knee -Performed by Dr. Mellody Dance  -Consent obtained after discussion of risks benefits and alternatives of procedure; patient wished to proceed. -Time-out conducted. -Noted no overlying erythema, warmth, induration, or other signs of local infection. -Skin prepped in a sterile fashion. -Topical analgesic spray: Ethyl chloride/ FreezEase. Completed without complication or difficulty.  Medication injected without resistance after negative aspiration.  Patient tolerated well.  EBL: Less than 0.1 mL Meds: 1cc 2% lidocaine, 2 mL 0.5% bupivacaine, 1 mL Depo-Medrol 40 mg per mL Injection site dressed with sterile bandage. -Pain immediately improved by over 50% from an 8/10 pain to a 1-2 /10 pain post-procedure, suggesting accurate placement of the medication. - Ice 15-20 minutes 3-4 times per day for the next 48 hours and any time after repetitive activity thereafter Advised to call if fevers/chills, erythema, induration, drainage, or bleeding.   Follow-up as discussed  - NSAIDS when necessary; risk benefits discussed with patient. - ICE for 15-20 minutes, 3-4 times per day. - pt understands may need MRI if knee continues to be in pain    No problem-specific Assessment & Plan notes found for this encounter.   The patient was counseled,  risk factors were discussed, anticipatory guidance given.   New Prescriptions   No medications on file     Discontinued Medications   No medications on file     Modified Medications   No medications on file      No orders of the defined types were placed in this encounter.    Gross side effects, risk and benefits, and alternatives of medications and treatment plan in general discussed with patient.  Patient is aware that all medications have potential side effects and we are unable to predict every side effect or drug-drug interaction that may occur.   Patient will call with any questions prior to using medication if they have concerns.  Expresses verbal understanding and consents to current therapy and treatment regimen.  No barriers to understanding were identified.  Red flag symptoms and signs discussed in detail.  Patient expressed understanding regarding what to do in case of emergency\urgent symptoms  Please see AVS handed out to patient at the end of our visit for further patient instructions/ counseling done pertaining to today's office visit.   Return if symptoms worsen or fail to improve.     Note: This document was prepared using Dragon voice recognition software and may include unintentional dictation errors.  Mellody Dance 12:37 PM --------------------------------------------------------------------------------------------------------------------------------------------------------------------------------------------------------------------------------------------    Subjective:    CC:  Chief Complaint  Patient presents with  . Follow-up    knee pain    HPI: Lindsay [name] is a 71 y.o. female who presents to [facility] today for issues as discussed  below.   per patient who had a right kee injection on 8\20\18, her knee pain I now 70% iprved r so.  Pain more occasional and not as constant as prior. Also pain still with  going upstairs bt it definitely improved, much more so going down.   Feels the R knee injection was definitely worth it and would like the L knee done today.   Problem  Chronic Pain of Left Knee     Wt Readings from Last 3 Encounters:  10/25/16 175 lb 3.2 oz (79.5 kg)  10/18/16 172 lb 6.4 oz (78.2 kg)  10/08/16 171 lb 1.6 oz (77.6 kg)   BP Readings from Last 3 Encounters:  10/25/16 128/76  10/18/16 133/78  10/08/16 132/79   Pulse Readings from Last 3 Encounters:  10/25/16 60  10/18/16 80  10/08/16 89   BMI Readings from Last 3 Encounters:  10/25/16 31.04 kg/m  10/18/16 30.54 kg/m  10/08/16 30.31 kg/m     Patient Care Team    Relationship Specialty Notifications Start End  Mellody Dance, DO PCP - General Family Medicine  08/27/16   Adrian Prows, [place] Physician Cardiology  08/27/16   Evans Lance, MD Consulting Physician Cardiology  08/27/16   Juanita Craver, MD Consulting Physician Gastroenterology  08/27/16   Jacelyn Pi, MD Consulting Physician Endocrinology  08/27/16   Macarthur Critchley, [place] Referring Physician Optometry  08/27/16   Darleen Crocker, MD Consulting Physician Ophthalmology  08/27/16   Linton Rump, PT Physical Therapist Physical Therapy  08/27/16   Megan Salon, MD Consulting Physician Gynecology  08/27/16      Patient Active Problem List   Diagnosis Date Noted  . Urinary incontinence 10/08/2016    Priority: High  . Obesity 08/26/2010    Priority: High  . Vegan diet 08/27/2016    Priority: Medium  . Paroxysmal SVT (supraventricular tachycardia) ([place]) 10/06/2010    Priority: Medium  . Palpitations 08/26/2010    Priority: Medium  . Thyroid goiter 08/27/2016    Priority: Low  . Chronic pain of left knee 10/25/2016  . Primary osteoarthritis of both knees 10/18/2016  . Chronic pain of both knees 10/08/2016  . Generalized OA 10/08/2016  . h/o Near syncope 08/20/2013  . h/o chronic Dizziness 08/20/2013  . Pain in joint, shoulder region  09/26/2012  . Biceps tendinopathy 09/26/2012  . Right wrist pain 09/26/2012    Past Medical history, Surgical history, Family history, Social history, Allergies and Medications have been entered into the medical record, reviewed and changed as needed.    Current Meds  Medication Sig  . aspirin EC 81 MG tablet Take 81 mg by mouth daily.  . cholecalciferol (VITAMIN D) 1000 units tablet Take 2,000 Units by mouth daily.  . Flaxseed, Linseed, (FLAX SEEDS PO) Take 30 mLs by mouth daily.  . folic acid (FOLVITE) 595 MCG tablet Take 400 mcg by mouth daily.  . Probiotic Product (PROBIOTIC FORMULA PO) Take 1 capsule by mouth daily. New chapter All-Flor[employer].  . TURMERIC PO Take by mouth daily.   Current Facility-Administered Medications for the 10/25/16 encounter (Office Visit) with Mellody Dance, DO  Medication  . bupivacaine (MARCAINE) 0.5 % injection 2 mL  . lidocaine (XYLOCAINE) 2 % (with pres) injection 20 mg  . methylPREDNISolone acetate (DEPO-MEDROL) injection 40 mg    Allergies:  No Known Allergies   Review of Systems: General:   Denies fever, chills, unexplained weight loss.  Optho/Auditory:   Denies visual changes, blurred vision/LOV Respiratory:  Denies wheeze, DOE more than baseline levels.  Cardiovascular:   Denies chest pain, palpitations, new onset peripheral edema  Gastrointestinal:   Denies nausea, vomiting, diarrhea, abd pain.  Genitourinary: Denies dysuria, freq/ urgency, flank pain or discharge from genitals.  Endocrine:     Denies hot or cold intolerance, polyuria, polydipsia. Musculoskeletal:   Denies unexplained myalgias, joint swelling, unexplained arthralgias, gait problems.  Skin:  Denies new onset rash, suspicious lesions Neurological:     Denies dizziness, unexplained weakness, numbness  Psychiatric/Behavioral:   Denies mood changes, suicidal or homicidal ideations, hallucinations    Objective:   Blood pressure 128/76, pulse 60, height 5\' 3"  (1.6 m),  weight 175 lb 3.2 oz (79.5 kg). Body mass index is 31.04 kg/m. General:  Well Developed, well nourished, appropriate for stated age.  Neuro:  Alert and oriented,  extra-ocular muscles intact  HEENT:  Normocephalic, atraumatic, neck supple, no carotid bruits appreciated  Skin:  no gross rash, warm, pink. Cardiac:  RRR, S1 S2 Respiratory:  ECTA B/L and A/P, Not using accessory muscles, speaking in full sentences- unlabored. Vascular:  Ext warm, no cyanosis apprec.; cap RF less 2 sec. Psych:  No HI/SI, judgement and insight good, Euthymic mood. Full Affect.

## 2016-11-04 ENCOUNTER — Other Ambulatory Visit: Payer: Self-pay | Admitting: Endocrinology

## 2016-11-04 DIAGNOSIS — E049 Nontoxic goiter, unspecified: Secondary | ICD-10-CM

## 2016-11-08 ENCOUNTER — Encounter: Payer: Self-pay | Admitting: Gastroenterology

## 2016-12-02 ENCOUNTER — Telehealth: Payer: Self-pay | Admitting: Family Medicine

## 2016-12-02 ENCOUNTER — Ambulatory Visit
Admission: RE | Admit: 2016-12-02 | Discharge: 2016-12-02 | Disposition: A | Discharge status: Home / Self Care | Payer: Medicare Other | Source: Ambulatory Visit | Attending: Endocrinology | Admitting: Endocrinology

## 2016-12-02 DIAGNOSIS — E049 Nontoxic goiter, unspecified: Secondary | ICD-10-CM

## 2016-12-02 DIAGNOSIS — E042 Nontoxic multinodular goiter: Secondary | ICD-10-CM | POA: Diagnosis not present

## 2016-12-02 NOTE — Telephone Encounter (Signed)
Pt called states she is going to see her endocrinologist on Monday & wants her June labs for [facility] sent to Dr. Chalmers Cater at [phone] f#. --pls cll patient if there are any questions. --glh

## 2016-12-03 NOTE — Telephone Encounter (Signed)
Lab results forwarded to Dr. Chalmers Cater.  LVM informing pt.  Charyl Bigger, CMA

## 2016-12-10 DIAGNOSIS — Z23 Encounter for immunization: Secondary | ICD-10-CM | POA: Diagnosis not present

## 2016-12-10 DIAGNOSIS — E049 Nontoxic goiter, unspecified: Secondary | ICD-10-CM | POA: Diagnosis not present

## 2016-12-17 ENCOUNTER — Encounter: Payer: Self-pay | Admitting: Gastroenterology

## 2016-12-17 ENCOUNTER — Ambulatory Visit (AMBULATORY_SURGERY_CENTER): Payer: Self-pay | Admitting: *Deleted

## 2016-12-17 VITALS — Ht 63.0 in | Wt 177.8 lb

## 2016-12-17 DIAGNOSIS — Z1211 Encounter for screening for malignant neoplasm of colon: Secondary | ICD-10-CM

## 2016-12-17 MED ORDER — NA SULFATE-K SULFATE-MG SULF 17.5-3.13-1.6 GM/177ML PO SOLN: ORAL | 0 refills | Status: DC

## 2016-12-17 NOTE — Progress Notes (Signed)
No allergies to eggs or soy. No problems with anesthesia.  Pt given Emmi instructions for colonoscopy  No oxygen use  No diet drug use  

## 2016-12-27 ENCOUNTER — Encounter: Payer: Self-pay | Admitting: Gastroenterology

## 2016-12-27 ENCOUNTER — Ambulatory Visit (AMBULATORY_SURGERY_CENTER): Payer: Medicare Other | Admitting: Gastroenterology

## 2016-12-27 VITALS — BP 120/61 | HR 64 | Temp 96.8°F | Resp 17

## 2016-12-27 DIAGNOSIS — Z1211 Encounter for screening for malignant neoplasm of colon: Secondary | ICD-10-CM

## 2016-12-27 DIAGNOSIS — Z1212 Encounter for screening for malignant neoplasm of rectum: Secondary | ICD-10-CM | POA: Diagnosis not present

## 2016-12-27 MED ORDER — SODIUM CHLORIDE 0.9 % IV SOLN: 500.0000 mL | INTRAVENOUS | Status: DC

## 2016-12-27 NOTE — Progress Notes (Signed)
Pt's states no medical or surgical changes since previsit or office visit. MAW

## 2016-12-27 NOTE — Progress Notes (Signed)
Report to PACU, RN, vss, BBS= Clear.  

## 2016-12-27 NOTE — Patient Instructions (Signed)
Hemorrhoids seen today. Handout given on hemorrhoids! Resume current medications. Call us with any questions or concerns. Thank you!   YOU HAD AN ENDOSCOPIC PROCEDURE TODAY AT [facility]:   Refer to the procedure report that was given to you for any specific questions about what was found during the examination.  If the procedure report does not answer your questions, please call your gastroenterologist to clarify.  If you requested that your care partner not be given the details of your procedure findings, then the procedure report has been included in a sealed envelope for you to review at your convenience later.  YOU SHOULD EXPECT: Some feelings of bloating in the abdomen. Passage of more gas than usual.  Walking can help get rid of the air that was put into your GI tract during the procedure and reduce the bloating. If you had a lower endoscopy (such as a colonoscopy or flexible sigmoidoscopy) you may notice spotting of blood in your stool or on the toilet paper. If you underwent a bowel prep for your procedure, you may not have a normal bowel movement for a few days.  Please Note:  You might notice some irritation and congestion in your nose or some drainage.  This is from the oxygen used during your procedure.  There is no need for concern and it should clear up in a day or so.  SYMPTOMS TO REPORT IMMEDIATELY:   Following lower endoscopy (colonoscopy or flexible sigmoidoscopy):  Excessive amounts of blood in the stool  Significant tenderness or worsening of abdominal pains  Swelling of the abdomen that is new, acute  Fever of 100F or higher  For urgent or emergent issues, a gastroenterologist can be reached at any hour by calling [phone].   DIET:  We do recommend a small meal at first, but then you may proceed to your regular diet.  Drink plenty of fluids but you should avoid alcoholic beverages for 24 hours.  ACTIVITY:  You should plan to take it easy for the  rest of today and you should NOT DRIVE or use heavy machinery until tomorrow (because of the sedation medicines used during the test).    FOLLOW UP: Our staff will call the number listed on your records the next business day following your procedure to check on you and address any questions or concerns that you may have regarding the information given to you following your procedure. If we do not reach you, we will leave a message.  However, if you are feeling well and you are not experiencing any problems, there is no need to return our call.  We will assume that you have returned to your regular daily activities without incident.  If any biopsies were taken you will be contacted by phone or by letter within the next 1-3 weeks.  Please call us at [phone] if you have not heard about the biopsies in 3 weeks.    SIGNATURES/CONFIDENTIALITY: You and/or your care partner have signed paperwork which will be entered into your electronic medical record.  These signatures attest to the fact that that the information above on your After Visit Summary has been reviewed and is understood.  Full responsibility of the confidentiality of this discharge information lies with you and/or your care-partner.

## 2016-12-27 NOTE — Op Note (Signed)
[facility] Patient Name: Lindsay [name] Procedure Date: 12/27/2016 10:54 AM MRN: [id number] Endoscopist: Remo Lipps P. Armbruster MD, MD Age: 71 Referring MD:  Date of Birth: [birth year] Gender: Female Account #: [id number] Procedure:                Colonoscopy Indications:              Screening for colorectal malignant neoplasm Medicines:                Monitored Anesthesia Care Procedure:                Pre-Anesthesia Assessment:                           - Prior to the procedure, a History and Physical                            was performed, and patient medications and                            allergies were reviewed. The patient's tolerance of                            previous anesthesia was also reviewed. The risks                            and benefits of the procedure and the sedation                            options and risks were discussed with the patient.                            All questions were answered, and informed consent                            was obtained. Prior Anticoagulants: The patient has                            taken no previous anticoagulant or antiplatelet                            agents. ASA Grade Assessment: II - A patient with                            mild systemic disease. After reviewing the risks                            and benefits, the patient was deemed in                            satisfactory condition to undergo the procedure.                           After obtaining informed consent, the colonoscope  was passed under direct vision. Throughout the                            procedure, the patient's blood pressure, pulse, and                            oxygen saturations were monitored continuously. The                            Colonoscope was introduced through the anus and                            advanced to the the cecum, identified by                            appendiceal  orifice and ileocecal valve. The                            colonoscopy was performed without difficulty. The                            patient tolerated the procedure well. The quality                            of the bowel preparation was good. The ileocecal                            valve, appendiceal orifice, and rectum were                            photographed. Scope In: 10:57:09 AM Scope Out: 11:11:13 AM Scope Withdrawal Time: 0 hours 8 minutes 43 seconds  Total Procedure Duration: 0 hours 14 minutes 4 seconds  Findings:                 The perianal and digital rectal examinations were                            normal.                           Internal hemorrhoids were found during retroflexion.                           The exam was otherwise without abnormality. No                            polyps Complications:            No immediate complications. Estimated blood loss:                            None. Estimated Blood Loss:     Estimated blood loss: none. Impression:               - Internal hemorrhoids.                           -  The examination was otherwise normal.                           - No polyps. Recommendation:           - Patient has a contact number available for                            emergencies. The signs and symptoms of potential                            delayed complications were discussed with the                            patient. Return to normal activities tomorrow.                            Written discharge instructions were provided to the                            patient.                           - Resume previous diet.                           - Continue present medications.                           - No repeat colonoscopy is needed given no prior                            history of colon polyps (you would not be due for                            repeat screening for 10 years, at which time you                             will be 71 years old. Screening often stops between                            the ages of 79-80 years) Carlota Raspberry. Armbruster MD, MD 12/27/2016 11:14:51 AM This report has been signed electronically.

## 2016-12-28 ENCOUNTER — Telehealth: Payer: Self-pay | Admitting: *Deleted

## 2016-12-28 NOTE — Telephone Encounter (Signed)
  Follow up Call-  Call back number 12/27/2016  Post procedure Call Back phone  # [phone] cell  Permission to leave phone message Yes  Some recent data might be hidden     Patient questions:  Do you have a fever, pain , or abdominal swelling? No. Pain Score  0 *  Have you tolerated food without any problems? Yes.    Have you been able to return to your normal activities? Yes.    Do you have any questions about your discharge instructions: Diet   No. Medications  No. Follow up visit  No.  Do you have questions or concerns about your Care? No.  Actions: * If pain score is 4 or above: No action needed, pain <4.

## 2017-03-01 DIAGNOSIS — R011 Cardiac murmur, unspecified: Secondary | ICD-10-CM

## 2017-03-01 HISTORY — DX: Cardiac murmur, unspecified: R01.1

## 2017-03-07 ENCOUNTER — Other Ambulatory Visit: Payer: Self-pay | Admitting: Adult Health

## 2017-03-07 ENCOUNTER — Telehealth: Payer: Self-pay | Admitting: Family Medicine

## 2017-03-07 ENCOUNTER — Encounter: Payer: Self-pay | Admitting: Adult Health

## 2017-03-07 ENCOUNTER — Ambulatory Visit (INDEPENDENT_AMBULATORY_CARE_PROVIDER_SITE_OTHER): Payer: Medicare Other | Admitting: Adult Health

## 2017-03-07 VITALS — BP 117/72 | HR 80 | Temp 97.8°F | Ht 63.0 in | Wt 176.5 lb

## 2017-03-07 DIAGNOSIS — R059 Cough, unspecified: Secondary | ICD-10-CM

## 2017-03-07 DIAGNOSIS — R05 Cough: Secondary | ICD-10-CM | POA: Diagnosis not present

## 2017-03-07 DIAGNOSIS — J0100 Acute maxillary sinusitis, unspecified: Secondary | ICD-10-CM | POA: Diagnosis not present

## 2017-03-07 MED ORDER — HYDROCOD POLST-CPM POLST ER 5-4 MG PO CP12: 1.0000 | ORAL_CAPSULE | Freq: Two times a day (BID) | ORAL | 0 refills | Status: DC | PRN

## 2017-03-07 MED ORDER — FLUTICASONE PROPIONATE 50 MCG/ACT NA SUSP: 2.0000 | Freq: Every day | NASAL | 6 refills | Status: DC

## 2017-03-07 MED ORDER — HYDROCOD POLST-CPM POLST ER 10-8 MG/5ML PO SUER: 5.0000 mL | Freq: Two times a day (BID) | ORAL | 0 refills | Status: DC | PRN

## 2017-03-07 MED ORDER — AZITHROMYCIN 250 MG PO TABS: ORAL_TABLET | ORAL | 0 refills | Status: DC

## 2017-03-07 NOTE — Telephone Encounter (Signed)
Afternoon Tonya, Can you please call Ms. Poinsett and share that I d/c'd Tussionex and sent in generic. Thanks! Valetta Fuller

## 2017-03-07 NOTE — Patient Instructions (Signed)
Cough, Adult Coughing is a reflex that clears your throat and your airways. Coughing helps to heal and protect your lungs. It is normal to cough occasionally, but a cough that happens with other symptoms or lasts a long time may be a sign of a condition that needs treatment. A cough may last only 2-3 weeks (acute), or it may last longer than 8 weeks (chronic). What are the causes? Coughing is commonly caused by:  Breathing in substances that irritate your lungs.  A viral or bacterial respiratory infection.  Allergies.  Asthma.  Postnasal drip.  Smoking.  Acid backing up from the stomach into the esophagus (gastroesophageal reflux).  Certain medicines.  Chronic lung problems, including COPD (or rarely, lung cancer).  Other medical conditions such as heart failure.  Follow these instructions at home: Pay attention to any changes in your symptoms. Take these actions to help with your discomfort:  Take medicines only as told by your health care provider. ? If you were prescribed an antibiotic medicine, take it as told by your health care provider. Do not stop taking the antibiotic even if you start to feel better. ? Talk with your health care provider before you take a cough suppressant medicine.  Drink enough fluid to keep your urine clear or pale yellow.  If the air is dry, use a cold steam vaporizer or humidifier in your bedroom or your home to help loosen secretions.  Avoid anything that causes you to cough at work or at home.  If your cough is worse at night, try sleeping in a semi-upright position.  Avoid cigarette smoke. If you smoke, quit smoking. If you need help quitting, ask your health care provider.  Avoid caffeine.  Avoid alcohol.  Rest as needed.  Contact a health care provider if:  You have new symptoms.  You cough up pus.  Your cough does not get better after 2-3 weeks, or your cough gets worse.  You cannot control your cough with suppressant  medicines and you are losing sleep.  You develop pain that is getting worse or pain that is not controlled with pain medicines.  You have a fever.  You have unexplained weight loss.  You have night sweats. Get help right away if:  You cough up blood.  You have difficulty breathing.  Your heartbeat is very fast. This information is not intended to replace advice given to you by your health care provider. Make sure you discuss any questions you have with your health care provider. Document Released: 08/14/2010 Document Revised: 07/24/2015 Document Reviewed: 04/24/2014 Elsevier Interactive Patient Education  2018 [employer].   Sinusitis, Adult Sinusitis is soreness and inflammation of your sinuses. Sinuses are hollow spaces in the bones around your face. Your sinuses are located:  Around your eyes.  In the middle of your forehead.  Behind your nose.  In your cheekbones.  Your sinuses and nasal passages are lined with a stringy fluid (mucus). Mucus normally drains out of your sinuses. When your nasal tissues become inflamed or swollen, the mucus can become trapped or blocked so air cannot flow through your sinuses. This allows bacteria, viruses, and funguses to grow, which leads to infection. Sinusitis can develop quickly and last for 7?10 days (acute) or for more than 12 weeks (chronic). Sinusitis often develops after a cold. What are the causes? This condition is caused by anything that creates swelling in the sinuses or stops mucus from draining, including:  Allergies.  Asthma.  Bacterial or viral  infection.  Abnormally shaped bones between the nasal passages.  Nasal growths that contain mucus (nasal polyps).  Narrow sinus openings.  Pollutants, such as chemicals or irritants in the air.  A foreign object stuck in the nose.  A fungal infection. This is rare.  What increases the risk? The following factors may make you more likely to develop this  condition:  Having allergies or asthma.  Having had a recent cold or respiratory tract infection.  Having structural deformities or blockages in your nose or sinuses.  Having a weak immune system.  Doing a lot of swimming or diving.  Overusing nasal sprays.  Smoking.  What are the signs or symptoms? The main symptoms of this condition are pain and a feeling of pressure around the affected sinuses. Other symptoms include:  Upper toothache.  Earache.  Headache.  Bad breath.  Decreased sense of smell and taste.  A cough that may get worse at night.  Fatigue.  Fever.  Thick drainage from your nose. The drainage is often green and it may contain pus (purulent).  Stuffy nose or congestion.  Postnasal drip. This is when extra mucus collects in the throat or back of the nose.  Swelling and warmth over the affected sinuses.  Sore throat.  Sensitivity to light.  How is this diagnosed? This condition is diagnosed based on symptoms, a medical history, and a physical exam. To find out if your condition is acute or chronic, your health care provider may:  Look in your nose for signs of nasal polyps.  Tap over the affected sinus to check for signs of infection.  View the inside of your sinuses using an imaging device that has a light attached (endoscope).  If your health care provider suspects that you have chronic sinusitis, you may also:  Be tested for allergies.  Have a sample of mucus taken from your nose (nasal culture) and checked for bacteria.  Have a mucus sample examined to see if your sinusitis is related to an allergy.  If your sinusitis does not respond to treatment and it lasts longer than 8 weeks, you may have an MRI or CT scan to check your sinuses. These scans also help to determine how severe your infection is. In rare cases, a bone biopsy may be done to rule out more serious types of fungal sinus disease. How is this treated? Treatment for  sinusitis depends on the cause and whether your condition is chronic or acute. If a virus is causing your sinusitis, your symptoms will go away on their own within 10 days. You may be given medicines to relieve your symptoms, including:  Topical nasal decongestants. They shrink swollen nasal passages and let mucus drain from your sinuses.  Antihistamines. These drugs block inflammation that is triggered by allergies. This can help to ease swelling in your nose and sinuses.  Topical nasal corticosteroids. These are nasal sprays that ease inflammation and swelling in your nose and sinuses.  Nasal saline washes. These rinses can help to get rid of thick mucus in your nose.  If your condition is caused by bacteria, you will be given an antibiotic medicine. If your condition is caused by a fungus, you will be given an antifungal medicine. Surgery may be needed to correct underlying conditions, such as narrow nasal passages. Surgery may also be needed to remove polyps. Follow these instructions at home: Medicines  Take, use, or apply over-the-counter and prescription medicines only as told by your health care provider. These  may include nasal sprays.  If you were prescribed an antibiotic medicine, take it as told by your health care provider. Do not stop taking the antibiotic even if you start to feel better. Hydrate and Humidify  Drink enough water to keep your urine clear or pale yellow. Staying hydrated will help to thin your mucus.  Use a cool mist humidifier to keep the humidity level in your home above 50%.  Inhale steam for 10-15 minutes, 3-4 times a day or as told by your health care provider. You can do this in the bathroom while a hot shower is running.  Limit your exposure to cool or dry air. Rest  Rest as much as possible.  Sleep with your head raised (elevated).  Make sure to get enough sleep each night. General instructions  Apply a warm, moist washcloth to your face 3-4  times a day or as told by your health care provider. This will help with discomfort.  Wash your hands often with soap and water to reduce your exposure to viruses and other germs. If soap and water are not available, use hand sanitizer.  Do not smoke. Avoid being around people who are smoking (secondhand smoke).  Keep all follow-up visits as told by your health care provider. This is important. Contact a health care provider if:  You have a fever.  Your symptoms get worse.  Your symptoms do not improve within 10 days. Get help right away if:  You have a severe headache.  You have persistent vomiting.  You have pain or swelling around your face or eyes.  You have vision problems.  You develop confusion.  Your neck is stiff.  You have trouble breathing. This information is not intended to replace advice given to you by your health care provider. Make sure you discuss any questions you have with your health care provider. Document Released: 02/15/2005 Document Revised: 10/12/2015 Document Reviewed: 12/11/2014 Elsevier Interactive Patient Education  2018 [employer].   Please take Azithromycin, Tussionex, and Flonase as directed. Increase fluids/rest/vit c-2,000mg /day when not feeling well. Alternate OTC Acetaminophen and Ibuprofen for fever/aches. If you still do not feel well after completing Azithromycin then please call clinic. FEEL BETTER!

## 2017-03-07 NOTE — Telephone Encounter (Signed)
Pt called states cough medicine cost  $65.00 per bottle---Ins Co does " NOT" pay for this medicine--- pls suggest/ prescribe a different one--she cannot afford-  chlorpheniramine-HYDROcodone (TUSSIONEX PENNKINETIC ER) 10-8 MG/5ML Latanya Presser [[id number]]  Order Details  Dose: 5 mL Route: Oral Frequency: Every 12 hours PRN  Dispense Quantity: 140 mL Refills: 0 Fills remaining: --        Sig: Take 5 mLs by mouth every 12 (twelve) hours as needed  Patient uses:  Pharmacy:  [facility] [place]         --glh

## 2017-03-07 NOTE — Assessment & Plan Note (Addendum)
[place] Controlled Substance Database reviewed- no contraindication noted.  Please take Azithromycin, Tussionex, and Flonase as directed. Increase fluids/rest/vit c-2,000mg /day when not feeling well. Alternate OTC Acetaminophen and Ibuprofen for fever/aches. If you still do not feel well after completing Azithromycin then please call clinic.

## 2017-03-07 NOTE — Assessment & Plan Note (Signed)
Flonase, Z pack

## 2017-03-07 NOTE — Progress Notes (Signed)
Subjective:    Patient ID: Lindsay [name], female    DOB: 1945/06/12, 72 y.o.   MRN: [id number]  HPI:  [name] presents with productive cough that initially began 8 weeks ago, lasted two weeks then resolved. Then 2 weeks ago productive cough (thick/green mucus) and thick/green nasal drainage developed.  She denies fever/night sweats/chills/N/V/D/CP/dyspnea at rest/palpitatoins.  She has been increasing fluids, but "just can't kick this cough".  Patient Care Team    Relationship Specialty Notifications Start End  Mellody Dance, DO PCP - General Family Medicine  08/27/16   Adrian Prows, [place] Physician Cardiology  08/27/16   Evans Lance, MD Consulting Physician Cardiology  08/27/16   Juanita Craver, MD Consulting Physician Gastroenterology  08/27/16   Jacelyn Pi, MD Consulting Physician Endocrinology  08/27/16   Macarthur Critchley, [place] Referring Physician Optometry  08/27/16   Darleen Crocker, MD Consulting Physician Ophthalmology  08/27/16   Linton Rump, PT Physical Therapist Physical Therapy  08/27/16   Megan Salon, MD Consulting Physician Gynecology  08/27/16     Patient Active Problem List   Diagnosis Date Noted  . Chronic pain of left knee 10/25/2016  . Primary osteoarthritis of both knees 10/18/2016  . Urinary incontinence 10/08/2016  . Chronic pain of both knees 10/08/2016  . Generalized OA 10/08/2016  . Vegan diet 08/27/2016  . Thyroid goiter 08/27/2016  . h/o Near syncope 08/20/2013  . h/o chronic Dizziness 08/20/2013  . Pain in joint, shoulder region 09/26/2012  . Biceps tendinopathy 09/26/2012  . Right wrist pain 09/26/2012  . Paroxysmal SVT (supraventricular tachycardia) ([place]) 10/06/2010  . Palpitations 08/26/2010  . Obesity 08/26/2010     Past Medical History:  Diagnosis Date  . Arthritis   . Chest pain   . Herpes   . History of hepatitis    and mononucleosis  . Hypothyroidism   . Mononucleosis 1967  . Multinodular goiter   . Osteopenia   .  Palpitations   . S/P AV nodal ablation 08/05/2011  . SVT (supraventricular tachycardia) ([place])   . Thyroid nodule      Past Surgical History:  Procedure Laterality Date  . BREAST CYST ASPIRATION Bilateral 12/15/2000  . CATARACT EXTRACTION W/ INTRAOCULAR LENS IMPLANT Bilateral 2014  . paragard iud     8/90  . SUPRAVENTRICULAR TACHYCARDIA ABLATION N/A 08/05/2011   Procedure: SUPRAVENTRICULAR TACHYCARDIA ABLATION;  Surgeon: Evans Lance, MD;  Location: [facility] CATH LAB;  Service: Cardiovascular;  Laterality: N/A;  . TONSILLECTOMY       Family History  Problem Relation Age of Onset  . Dementia Mother 13       alive  . Transient ischemic attack Mother        hx of; has a pacemaker  . Hypothyroidism Mother   . Other Father 60       old age. Develop CHF the last 4 months  . Other Sister 4       died of thymoma  . Hypothyroidism Sister   . Other Maternal Aunt        nasal tumor  . Heart attack Maternal Grandfather   . Hypothyroidism Maternal Aunt   . Stroke Paternal Grandfather   . Colon cancer Neg Hx   . Esophageal cancer Neg Hx   . Pancreatic cancer Neg Hx   . Prostate cancer Neg Hx   . Rectal cancer Neg Hx   . Stomach cancer Neg Hx      Social History   Substance and  Sexual Activity  Drug Use No     Social History   Substance and Sexual Activity  Alcohol Use Yes  . Alcohol/week: 0.0 oz   Comment: occasional     Social History   Tobacco Use  Smoking Status Former Smoker  . Types: Cigarettes  . Last attempt to quit: 03/01/1970  . Years since quitting: 47.0  Smokeless Tobacco Never Used     Outpatient Encounter Medications as of 03/07/2017  Medication Sig  . aspirin EC 81 MG tablet Take 81 mg by mouth daily.  . cholecalciferol (VITAMIN D) 1000 units tablet Take 2,000 Units by mouth daily.  . Flaxseed, Linseed, (FLAX SEEDS PO) Take 30 mLs by mouth daily.  . folic acid (FOLVITE) 696 MCG tablet Take 400 mcg by mouth daily.  Marland Kitchen GINSENG PO Take by mouth daily.   . Probiotic Product (PROBIOTIC FORMULA PO) Take 1 capsule by mouth daily. New chapter [employer]-Flor[employer].  . TURMERIC PO Take by mouth daily.  Marland Kitchen levothyroxine (SYNTHROID, LEVOTHROID) 25 MCG tablet Take 1 tablet by mouth daily.   Facility-Administered Encounter Medications as of 03/07/2017  Medication  . bupivacaine (MARCAINE) 0.5 % injection 2 mL  . lidocaine (XYLOCAINE) 2 % (with pres) injection 20 mg  . methylPREDNISolone acetate (DEPO-MEDROL) injection 40 mg    Allergies: Patient has no known allergies.  Body mass index is 31.27 kg/m.  Blood pressure 117/72, pulse 80, temperature 97.8 F (36.6 C), temperature source Oral, height 5\' 3"  (1.6 m), weight 176 lb 8 oz (80.1 kg), SpO2 98 %.     Review of Systems  Constitutional: Positive for activity change and fatigue. Negative for appetite change, chills, diaphoresis, fever and unexpected weight change.  HENT: Positive for congestion, postnasal drip, rhinorrhea and sinus pressure. Negative for ear pain, sinus pain, trouble swallowing and voice change.   Eyes: Negative for photophobia, pain, discharge, redness, itching and visual disturbance.  Respiratory: Positive for cough. Negative for chest tightness, shortness of breath, wheezing and stridor.   Cardiovascular: Negative for chest pain, palpitations and leg swelling.  Gastrointestinal: Negative for abdominal distention, abdominal pain, blood in stool, constipation, diarrhea, nausea and vomiting.       Objective:   Physical Exam  Constitutional: She is oriented to person, place, and time. She appears well-developed and well-nourished. No distress.  HENT:  Head: Normocephalic.  Right Ear: Hearing, tympanic membrane, external ear and ear canal normal. Tympanic membrane is not erythematous and not bulging. No decreased hearing is noted.  Left Ear: Hearing, tympanic membrane, external ear and ear canal normal. Tympanic membrane is not erythematous and not bulging. No decreased hearing is  noted.  Nose: Mucosal edema and rhinorrhea present. Right sinus exhibits maxillary sinus tenderness. Right sinus exhibits no frontal sinus tenderness. Left sinus exhibits maxillary sinus tenderness. Left sinus exhibits no frontal sinus tenderness.  Mouth/Throat: Uvula is midline, oropharynx is clear and moist and mucous membranes are normal.  Eyes: Conjunctivae are normal. Pupils are equal, round, and reactive to light.  Neck: Normal range of motion. Neck supple.  Cardiovascular: Normal rate, regular rhythm, normal heart sounds and intact distal pulses.  No murmur heard. Pulmonary/Chest: Effort normal. No respiratory distress. She has no decreased breath sounds. She has wheezes in the right middle field and the right lower field. She has no rhonchi. She has no rales. She exhibits no tenderness.  Posterior wheezing that will clear with deep breathing/coughing  Lymphadenopathy:    She has no cervical adenopathy.  Neurological: She is alert and  oriented to person, place, and time.  Skin: Skin is warm and dry. No rash noted. She is not diaphoretic. No erythema. No pallor.  Psychiatric: She has a normal mood and affect. Her behavior is normal. Judgment and thought content normal.  Nursing note and vitals reviewed.         Assessment & Plan:   1. Cough in adult   2. Acute maxillary sinusitis, recurrence not specified     Acute maxillary sinusitis Flonase, Z pack  Cough in adult [place] Controlled Substance Database reviewed- no contraindication noted.  Please take Azithromycin, Tussionex, and Flonase as directed. Increase fluids/rest/vit c-2,000mg /day when not feeling well. Alternate OTC Acetaminophen and Ibuprofen for fever/aches. If you still do not feel well after completing Azithromycin then please call clinic.    FOLLOW-UP:  Return if symptoms worsen or fail to improve.

## 2017-03-08 NOTE — Telephone Encounter (Signed)
LVM informing pt of RX change.  Charyl Bigger, CMA

## 2017-03-11 ENCOUNTER — Telehealth: Payer: Self-pay | Admitting: Family Medicine

## 2017-03-11 DIAGNOSIS — E049 Nontoxic goiter, unspecified: Secondary | ICD-10-CM | POA: Diagnosis not present

## 2017-03-11 NOTE — Telephone Encounter (Signed)
Patient was seen by Valetta Fuller on Monday for acute sick visit. She is asking for an antibiotic (finished the one she already got but is still feeling sick, but better than she was and wants to try to keep it from coming back). Please advise

## 2017-03-14 NOTE — Telephone Encounter (Signed)
Afternoon Melissa, I need specific sx's.  Please call and inquiry. Thanks! Valetta Fuller

## 2017-03-14 NOTE — Telephone Encounter (Signed)
Called the patient to see what current symptoms are had to leave a message for patient to call back. Please advise, if patient needs to come back in for a follow up or if you want to wait until she calls with current symptoms. MPulliam, CMA/RT(R)

## 2017-03-15 NOTE — Telephone Encounter (Signed)
Called patient and left message for patient to call the office back. MPulliam, CMA/RT(R)  

## 2017-03-25 ENCOUNTER — Other Ambulatory Visit: Payer: Self-pay | Admitting: Obstetrics & Gynecology

## 2017-03-25 DIAGNOSIS — Z139 Encounter for screening, unspecified: Secondary | ICD-10-CM

## 2017-04-15 ENCOUNTER — Ambulatory Visit
Admission: RE | Admit: 2017-04-15 | Discharge: 2017-04-15 | Disposition: A | Discharge status: Home / Self Care | Payer: Medicare Other | Source: Ambulatory Visit | Attending: Obstetrics & Gynecology | Admitting: Obstetrics & Gynecology

## 2017-04-15 DIAGNOSIS — Z139 Encounter for screening, unspecified: Secondary | ICD-10-CM

## 2017-04-15 DIAGNOSIS — Z1231 Encounter for screening mammogram for malignant neoplasm of breast: Secondary | ICD-10-CM | POA: Diagnosis not present

## 2017-06-20 ENCOUNTER — Ambulatory Visit (INDEPENDENT_AMBULATORY_CARE_PROVIDER_SITE_OTHER): Payer: Medicare Other | Admitting: Family Medicine

## 2017-06-20 ENCOUNTER — Encounter: Payer: Self-pay | Admitting: Family Medicine

## 2017-06-20 VITALS — BP 104/67 | HR 52 | Temp 98.7°F | Resp 18 | Ht 63.5 in | Wt 178.0 lb

## 2017-06-20 DIAGNOSIS — G8929 Other chronic pain: Secondary | ICD-10-CM | POA: Diagnosis not present

## 2017-06-20 DIAGNOSIS — M25562 Pain in left knee: Secondary | ICD-10-CM

## 2017-06-20 DIAGNOSIS — M1710 Unilateral primary osteoarthritis, unspecified knee: Secondary | ICD-10-CM | POA: Insufficient documentation

## 2017-06-20 DIAGNOSIS — M25561 Pain in right knee: Secondary | ICD-10-CM | POA: Diagnosis not present

## 2017-06-20 DIAGNOSIS — M170 Bilateral primary osteoarthritis of knee: Secondary | ICD-10-CM | POA: Diagnosis not present

## 2017-06-20 DIAGNOSIS — M179 Osteoarthritis of knee, unspecified: Secondary | ICD-10-CM | POA: Insufficient documentation

## 2017-06-20 NOTE — Progress Notes (Signed)
Impression and Recommendations:    1. Osteoarthritis of knee, unspecified laterality, unspecified osteoarthritis type   2. Chronic pain of both knees   3. Primary osteoarthritis of both knees     OA of knee/Chronic pain of both knees/primary OA of both knees  -Injection done on the R knee today because we do not have imaging available to obtain on the L knee.  -Pt will return later this week for same L knee injection after we obtain imaging.  -ice your knees regularly, 15-20 minutes a day for 3-4 days a week.   Procedure:   -Injection of R knee -Performed by Dr. Mellody Dance  -Consent obtained after discussion of risks benefits and alternatives of procedure; patient wished to proceed. -Time-out conducted. -Noted no overlying erythema, warmth, induration, or other signs of local infection. -Skin prepped in a sterile fashion. -Topical analgesic spray: Ethyl chloride/ FreezEase. Completed without complication or difficulty.  Medication injected without resistance after negative aspiration.  Patient tolerated well.  EBL: Less than 0.1 mL Meds: 1cc 2% lidocaine, 2 mL 0.5% bupivacaine, 1 mL Depo-Medrol 40 mg per mL Injection site dressed with sterile bandage. -Pain immediately improved by over 50%, suggesting accurate placement of the medication. - Ice 15-20 minutes 3-4 times per day for the next 48 hours and any time after repetitive activity thereafter Advised to call if fevers/chills, erythema, induration, drainage, or bleeding.   Follow-up as discussed.  -NSAIDs when necessary; risk benefits discussed with pt.    Gross side effects, risk and benefits, and alternatives of medications and treatment plan in general discussed with patient.  Patient is aware that all medications have potential side effects and we are unable to predict every side effect or drug-drug interaction that may occur.   Patient will call with any questions prior to using medication if they have  concerns.  Expresses verbal understanding and consents to current therapy and treatment regimen.  No barriers to understanding were identified.  Red flag symptoms and signs discussed in detail.  Patient expressed understanding regarding what to do in case of emergency\urgent symptoms  Please see AVS handed out to patient at the end of our visit for further patient instructions/ counseling done pertaining to today's office visit.   Return for Keep appointment in a couple days for other knee.  Will need x-ray.    Note: This note was prepared with assistance of Dragon voice recognition software. Occasional wrong-word or sound-a-like substitutions may have occurred due to the inherent limitations of voice recognition software.   This document serves as a record of services personally performed by Mellody Dance, DO. It was created on her behalf by Mayer Masker, a trained medical scribe. The creation of this record is based on the scribe's personal observations and the provider's statements to them.   I have reviewed the above medical documentation for accuracy and completeness and I concur.  Mellody Dance 06/23/17 8:22 AM   --------------------------------------------------------------------------------------------------------------------------------------------------------------------------------------------------------------------------------------------    Subjective:     HPI: Lindsay [name] is a 72 y.o. female who presents to [place] at [facility] today for knee pain.  Knee R knee injected 10-18-2016. She followed up 1 week later for left knee injection 10-25-2016. She has a h/o osteoarthritis of both knees and these issues are chronic. She had an AP lateral on the R knee but not on the L.   She reports her knee injections improved her knee pain. She states her L knee injection  was better than her R.   She has an appointment later this week for R knee injection.  Today she is requesting a L knee injection.  She is going to [place] in a few weeks and is going to walk.  Previously, she states she had 90-95% pain reduction that lasted "a long time". She states walking normally she does not have a lot of pain but it is worse when she walks up stairs.   Walking in, her pain was 2/10, now it is unchanged or maybe 1/10 s/p procedure.    Wt Readings from Last 3 Encounters:  06/20/17 178 lb (80.7 kg)  03/07/17 176 lb 8 oz (80.1 kg)  12/17/16 177 lb 12.8 oz (80.6 kg)   BP Readings from Last 3 Encounters:  06/20/17 104/67  03/07/17 117/72  12/27/16 120/61   Pulse Readings from Last 3 Encounters:  06/20/17 (!) 52  03/07/17 80  12/27/16 64   BMI Readings from Last 3 Encounters:  06/20/17 31.04 kg/m  03/07/17 31.27 kg/m  12/17/16 31.50 kg/m     Patient Care Team    Relationship Specialty Notifications Start End  Mellody Dance, DO PCP - General Family Medicine  08/27/16   Adrian Prows, [place] Physician Cardiology  08/27/16   Evans Lance, MD Consulting Physician Cardiology  08/27/16   Juanita Craver, MD Consulting Physician Gastroenterology  08/27/16   Jacelyn Pi, MD Consulting Physician Endocrinology  08/27/16   Macarthur Critchley, [place] Referring Physician Optometry  08/27/16   Darleen Crocker, MD Consulting Physician Ophthalmology  08/27/16   Linton Rump, PT Physical Therapist Physical Therapy  08/27/16   Megan Salon, MD Consulting Physician Gynecology  08/27/16      Patient Active Problem List   Diagnosis Date Noted  . Urinary incontinence 10/08/2016    Priority: High  . Obesity 08/26/2010    Priority: High  . Vegan diet 08/27/2016    Priority: Medium  . Paroxysmal SVT (supraventricular tachycardia) ([place]) 10/06/2010    Priority: Medium  . Palpitations 08/26/2010    Priority: Medium  . Thyroid goiter 08/27/2016    Priority: Low  . Osteoarthritis of knee 06/20/2017  . Cough in adult 03/07/2017  . Acute maxillary sinusitis  03/07/2017  . Chronic pain of left knee 10/25/2016  . Primary osteoarthritis of both knees 10/18/2016  . Chronic pain of both knees 10/08/2016  . Generalized OA 10/08/2016  . h/o Near syncope 08/20/2013  . h/o chronic Dizziness 08/20/2013  . Pain in joint, shoulder region 09/26/2012  . Biceps tendinopathy 09/26/2012  . Right wrist pain 09/26/2012    Past Medical history, Surgical history, Family history, Social history, Allergies and Medications have been entered into the medical record, reviewed and changed as needed.    Current Meds  Medication Sig  . aspirin EC 81 MG tablet Take 81 mg by mouth daily.  . cholecalciferol (VITAMIN D) 1000 units tablet Take 2,000 Units by mouth daily.  . Flaxseed, Linseed, (FLAX SEEDS PO) Take 30 mLs by mouth daily.  . folic acid (FOLVITE) 496 MCG tablet Take 400 mcg by mouth daily.  Marland Kitchen GINSENG PO Take by mouth daily.  Marland Kitchen levothyroxine (SYNTHROID, LEVOTHROID) 25 MCG tablet Take 1 tablet by mouth daily.  . Probiotic Product (PROBIOTIC FORMULA PO) Take 1 capsule by mouth daily. New chapter All-Flora.  . TURMERIC PO Take by mouth daily.   Current Facility-Administered Medications for the 06/20/17 encounter (Procedure visit) with Mellody Dance, DO  Medication  . bupivacaine (MARCAINE)  0.5 % injection 2 mL  . lidocaine (XYLOCAINE) 2 % (with pres) injection 20 mg  . methylPREDNISolone acetate (DEPO-MEDROL) injection 40 mg    Allergies:  No Known Allergies   Review of Systems:  A fourteen system review of systems was performed and found to be positive as per HPI.   Objective:   Blood pressure 104/67, pulse (!) 52, temperature 98.7 F (37.1 C), temperature source Oral, resp. rate 18, height 5' 3.5" (1.613 m), weight 178 lb (80.7 kg), SpO2 96 %. Body mass index is 31.04 kg/m. General:  Well Developed, well nourished, appropriate for stated age.  Neuro:  Alert and oriented,  extra-ocular muscles intact  HEENT:  Normocephalic, atraumatic, neck  supple, no carotid bruits appreciated  Skin:  no gross rash, warm, pink. Cardiac:  RRR, S1 S2 Respiratory:  ECTA B/L and A/P, Not using accessory muscles, speaking in full sentences- unlabored. Vascular:  Ext warm, no cyanosis apprec.; cap RF less 2 sec. Psych:  No HI/SI, judgement and insight good, Euthymic mood. Full Affect. Musk- mild swelling of the knee. No rash, skin abnormality, or erythema.

## 2017-06-20 NOTE — Patient Instructions (Signed)
Patient declined AVS has appointment 2-3 days

## 2017-06-23 ENCOUNTER — Encounter: Payer: Self-pay | Admitting: Family Medicine

## 2017-06-23 ENCOUNTER — Ambulatory Visit: Payer: Medicare Other

## 2017-06-23 ENCOUNTER — Ambulatory Visit (INDEPENDENT_AMBULATORY_CARE_PROVIDER_SITE_OTHER): Payer: Medicare Other | Admitting: Family Medicine

## 2017-06-23 VITALS — BP 129/74 | HR 73 | Ht 64.0 in | Wt 178.0 lb

## 2017-06-23 DIAGNOSIS — G8929 Other chronic pain: Secondary | ICD-10-CM

## 2017-06-23 DIAGNOSIS — M179 Osteoarthritis of knee, unspecified: Secondary | ICD-10-CM

## 2017-06-23 DIAGNOSIS — M25562 Pain in left knee: Secondary | ICD-10-CM

## 2017-06-23 DIAGNOSIS — M1710 Unilateral primary osteoarthritis, unspecified knee: Secondary | ICD-10-CM | POA: Diagnosis not present

## 2017-06-23 DIAGNOSIS — M25561 Pain in right knee: Secondary | ICD-10-CM | POA: Diagnosis not present

## 2017-06-23 DIAGNOSIS — M1712 Unilateral primary osteoarthritis, left knee: Secondary | ICD-10-CM | POA: Diagnosis not present

## 2017-06-23 NOTE — Progress Notes (Signed)
Impression and Recommendations:    1. Left knee pain, unspecified chronicity   2. Osteoarthritis of knee, unspecified laterality, unspecified osteoarthritis type   3. Chronic pain of both knees   4. Chronic pain of left knee     OA of knee/Chronic pain of both knees/primary OA of both knees  -Xray done on L knee today.   -Injection done on the L knee today. -ice your knees regularly, 15-20 minutes a day for 3-4 days a week.   Procedure:   -Injection of L knee -Performed by Dr. Mellody Dance  -Consent obtained after discussion of risks benefits and alternatives of procedure; patient wished to proceed. -Time-out conducted. -Noted no overlying erythema, warmth, induration, or other signs of local infection. -Skin prepped in a sterile fashion. -Topical analgesic spray: Ethyl chloride/ FreezEase. Completed without complication or difficulty.  Medication injected without resistance after negative aspiration.  Patient tolerated well.  EBL: Less than 0.1 mL Meds: 1cc 2% lidocaine, 2 mL 0.5% bupivacaine, 1 mL Depo-Medrol 40 mg per mL Injection site dressed with sterile bandage. -Pain immediately improved by over 50%, suggesting accurate placement of the medication. - Ice 15-20 minutes 3-4 times per day for the next 48 hours and any time after repetitive activity thereafter Advised to call if fevers/chills, erythema, induration, drainage, or bleeding.   Follow-up as discussed.  -NSAIDs when necessary; risk benefits discussed with pt.    Gross side effects, risk and benefits, and alternatives of medications and treatment plan in general discussed with patient.  Patient is aware that all medications have potential side effects and we are unable to predict every side effect or drug-drug interaction that may occur.   Patient will call with any questions prior to using medication if they have concerns.  Expresses verbal understanding and consents to current therapy and treatment  regimen.  No barriers to understanding were identified.  Red flag symptoms and signs discussed in detail.  Patient expressed understanding regarding what to do in case of emergency\urgent symptoms  Please see AVS handed out to patient at the end of our visit for further patient instructions/ counseling done pertaining to today's office visit.   Return for told pt to Please follow-up for chronic care as previously discussed.  This is very important.    Note: This note was prepared with assistance of Dragon voice recognition software. Occasional wrong-word or sound-a-like substitutions may have occurred due to the inherent limitations of voice recognition software.  This document serves as a record of services personally performed by Mellody Dance, DO. It was created on her behalf by Mayer Masker, a trained medical scribe. The creation of this record is based on the scribe's personal observations and the provider's statements to them.   I have reviewed the above medical documentation for accuracy and completeness and I concur.  Mellody Dance 06/30/17 2:39 PM   --------------------------------------------------------------------------------------------------------------------------------------------------------------------------    Subjective:     HPI: Lindsay [name] is a 72 y.o. female who presents to [place] at [facility] today for knee pain.  Knee R knee injected 10-18-2016. She followed up 1 week later for left knee injection 10-25-2016. She has a h/o osteoarthritis of both knees and these issues are chronic. She had an AP lateral on the R knee but not on the L.   She reports her knee injections previously improved her knee pain. She states her L knee injection was better than her R.   She received a R  knee injection earlier this week on 06-20-17. She tolerated this well without complications. Today she is requesting a L knee injection.  She is going to [place] in a few weeks and is going to walk a lot.  Previously, she states she had 90-95% pain reduction that lasted "a long time". She states walking normally she does not have a lot of pain but it is worse when she walks up stairs.   Today, walking in her pain was 3-4/10, after the injection it is 0-1/10 but she describes this as a tightness.  Wt Readings from Last 3 Encounters:  06/23/17 178 lb (80.7 kg)  06/20/17 178 lb (80.7 kg)  03/07/17 176 lb 8 oz (80.1 kg)   BP Readings from Last 3 Encounters:  06/23/17 129/74  06/20/17 104/67  03/07/17 117/72   Pulse Readings from Last 3 Encounters:  06/23/17 73  06/20/17 (!) 52  03/07/17 80   BMI Readings from Last 3 Encounters:  06/23/17 30.55 kg/m  06/20/17 31.04 kg/m  03/07/17 31.27 kg/m     Patient Care Team    Relationship Specialty Notifications Start End  Mellody Dance, DO PCP - General Family Medicine  08/27/16   Adrian Prows, [place] Physician Cardiology  08/27/16   Evans Lance, MD Consulting Physician Cardiology  08/27/16   Juanita Craver, MD Consulting Physician Gastroenterology  08/27/16   Jacelyn Pi, MD Consulting Physician Endocrinology  08/27/16   Macarthur Critchley, [place] Referring Physician Optometry  08/27/16   Darleen Crocker, MD Consulting Physician Ophthalmology  08/27/16   Linton Rump, PT Physical Therapist Physical Therapy  08/27/16   Megan Salon, MD Consulting Physician Gynecology  08/27/16      Patient Active Problem List   Diagnosis Date Noted  . Urinary incontinence 10/08/2016    Priority: High  . Obesity 08/26/2010    Priority: High  . Vegan diet 08/27/2016    Priority: Medium  . Paroxysmal SVT (supraventricular tachycardia) ([place]) 10/06/2010    Priority: Medium  . Palpitations 08/26/2010    Priority: Medium  . Thyroid goiter 08/27/2016    Priority: Low  . Left knee pain 06/30/2017  . Osteoarthritis of knee 06/20/2017  . Cough in adult 03/07/2017  . Acute maxillary sinusitis 03/07/2017  .  Chronic pain of left knee 10/25/2016  . Primary osteoarthritis of both knees 10/18/2016  . Chronic pain of both knees 10/08/2016  . Generalized OA 10/08/2016  . h/o Near syncope 08/20/2013  . h/o chronic Dizziness 08/20/2013  . Pain in joint, shoulder region 09/26/2012  . Biceps tendinopathy 09/26/2012  . Right wrist pain 09/26/2012    Past Medical history, Surgical history, Family history, Social history, Allergies and Medications have been entered into the medical record, reviewed and changed as needed.    Current Meds  Medication Sig  . aspirin EC 81 MG tablet Take 81 mg by mouth daily.  . cholecalciferol (VITAMIN D) 1000 units tablet Take 2,000 Units by mouth daily.  . Flaxseed, Linseed, (FLAX SEEDS PO) Take 30 mLs by mouth daily.  . folic acid (FOLVITE) 810 MCG tablet Take 400 mcg by mouth daily.  Marland Kitchen GINSENG PO Take by mouth daily.  Marland Kitchen levothyroxine (SYNTHROID, LEVOTHROID) 25 MCG tablet Take 1 tablet by mouth daily.  . Probiotic Product (PROBIOTIC FORMULA PO) Take 1 capsule by mouth daily. New chapter All-Flora.  . TURMERIC PO Take by mouth daily.   Current Facility-Administered Medications for the 06/23/17 encounter (Office Visit) with Mellody Dance, DO  Medication  .  bupivacaine (MARCAINE) 0.5 % injection 2 mL  . lidocaine (XYLOCAINE) 2 % (with pres) injection 20 mg  . methylPREDNISolone acetate (DEPO-MEDROL) injection 40 mg    Allergies:  No Known Allergies   Review of Systems:  A fourteen system review of systems was performed and found to be positive as per HPI.   Objective:   Blood pressure 129/74, pulse 73, height 5\' 4"  (1.626 m), weight 178 lb (80.7 kg), SpO2 98 %. Body mass index is 30.55 kg/m. General:  Well Developed, well nourished, appropriate for stated age.  Neuro:  Alert and oriented,  extra-ocular muscles intact  HEENT:  Normocephalic, atraumatic, neck supple, no carotid bruits appreciated  Skin:  no gross rash, warm, pink. Cardiac:  RRR, S1  S2 Respiratory:  ECTA B/L and A/P, Not using accessory muscles, speaking in full sentences- unlabored. Vascular:  Ext warm, no cyanosis apprec.; cap RF less 2 sec. Psych:  No HI/SI, judgement and insight good, Euthymic mood. Full Affect. Musk- mild swelling of the knee. No rash, skin abnormality, or erythema.

## 2017-06-30 DIAGNOSIS — M25562 Pain in left knee: Secondary | ICD-10-CM | POA: Insufficient documentation

## 2017-08-02 ENCOUNTER — Encounter: Payer: Self-pay | Admitting: Family Medicine

## 2017-08-02 ENCOUNTER — Ambulatory Visit (INDEPENDENT_AMBULATORY_CARE_PROVIDER_SITE_OTHER): Payer: Medicare Other | Admitting: Family Medicine

## 2017-08-02 VITALS — BP 126/79 | HR 74 | Ht 64.0 in | Wt 173.8 lb

## 2017-08-02 DIAGNOSIS — R499 Unspecified voice and resonance disorder: Secondary | ICD-10-CM | POA: Diagnosis not present

## 2017-08-02 DIAGNOSIS — D229 Melanocytic nevi, unspecified: Secondary | ICD-10-CM

## 2017-08-02 DIAGNOSIS — K2100 Gastro-esophageal reflux disease with esophagitis, without bleeding: Secondary | ICD-10-CM | POA: Insufficient documentation

## 2017-08-02 DIAGNOSIS — R053 Chronic cough: Secondary | ICD-10-CM | POA: Insufficient documentation

## 2017-08-02 DIAGNOSIS — M62838 Other muscle spasm: Secondary | ICD-10-CM | POA: Insufficient documentation

## 2017-08-02 DIAGNOSIS — M62830 Muscle spasm of back: Secondary | ICD-10-CM | POA: Diagnosis not present

## 2017-08-02 DIAGNOSIS — R05 Cough: Secondary | ICD-10-CM

## 2017-08-02 MED ORDER — OMEPRAZOLE 20 MG PO CPDR: 20.0000 mg | DELAYED_RELEASE_CAPSULE | Freq: Every day | ORAL | 1 refills | Status: DC

## 2017-08-02 MED ORDER — RANITIDINE HCL 300 MG PO TABS: ORAL_TABLET | ORAL | 3 refills | Status: DC

## 2017-08-02 MED ORDER — CYCLOBENZAPRINE HCL 10 MG PO TABS: 10.0000 mg | ORAL_TABLET | Freq: Three times a day (TID) | ORAL | 0 refills | Status: DC | PRN

## 2017-08-02 MED ORDER — NAPROXEN 500 MG PO TABS: 500.0000 mg | ORAL_TABLET | Freq: Two times a day (BID) | ORAL | 0 refills | Status: DC

## 2017-08-02 NOTE — Patient Instructions (Addendum)
    The chronic cough  Everyone coughs, and nobody worries about an occasional cough. Many acute illnesses - ranging from hay fever and the common cold to bronchitis and pneumonia - produce recurrent coughs. But the cough that accompanies acute illnesses resolves in a matter of a few days to a few weeks. In contrast, a chronic cough is variously defined as one that lingers for more than three to eight weeks, sometimes lasting for months or even years.  Chronic coughing is common, so frequent that it rates as one of the most common reasons for seeing a doctor. Although both patients and doctors rightly focus their attention on finding the cough's cause, the cough itself is responsible for significant problems.  In addition to worry about the diagnosis, patients experience frustration and anxiety, especially if diagnosis and treatment stretches out over weeks, which is often the case.  Coughing interrupts sleep, producing fatigue and impairing concentration and work performance.  In this age of scary new viruses, social interactions are likely to suffer.  And coughing can also have important physical consequences, ranging from urinary incontinence to fainting and broken ribs.  Between medical tests, lost productivity at work, remedies that don't help, and treatments that do, coughing is also expensive.   What causes chronic coughing? Smoking is the leading cause. Sooner or later, most cigarette smokers develop a chronic "smoker's cough." Chemical irritation is responsible - but the same noxious chemicals that cause the simple smoker's cough can lead to far more serious conditions, such as bronchitis, emphysema, pneumonia, and lung cancer. The chronic cough is always a cause of concern for smokers.  A lingering cough is also a worry for nonsmokers. Fortunately, benign problems are responsible for most chronic coughs in nonsmokers. Benign or not, persistent coughing can cause worry, embarrassment,  exhaustion, and more. That's why chronic coughs should be diagnosed and treated before they linger too long.  Dozens of conditions can cause a recurrent, lingering cough, but the lion's share are caused by just five: postnasal drip, asthma, gastroesophageal reflux disease (GERD), chronic bronchitis, and treatment with ACE inhibitors, used for high blood pressure. Many people have several of these conditions, but in nonsmokers, the first three, singly or in combination, account for nearly all chronic coughs. The major causes of long-term coughing are listed below.   Persistent cough: Major causes   (Common causes of a nagging cough)  Postnasal drip  Asthma and other airborne environmental irritants  Gastroesophageal reflux disease  Chronic bronchitis; bronchiectasis  Treatment with ACE inhibitors    (Less common causes of a nagging cough)  Cough is common in smokers- Tobacco smoke itself    Aspiration during swallowing  Heart failure  Lung infections  Pertussis (whooping cough)  Lung cancer  Other lung diseases  Psychological disorders  Lung cancer  Lung infections    Doctors can perform a variety of tests to diagnose patients with chronic coughs. The tests are accurate and successful, but aside from a simple chest x-ray, testing is usually not necessary. That's because people can often figure things out for themselves. But before you attempt to diagnose and treat yourself, review the red flags that call for prompt medical attention instead of a do-it-yourself approach (below).  If you're like most people with a lingering cough, consider these major causes:  1. Postnasal drip (also called the upper airway cough syndrome).  The human nose is more than the organ of smell. It is also the gateway to the lower respiratory   tract. As such, its job is to condition the air passing through en route to the lungs. The nose warms air that is cool, adds moisture to air that is dry,  and removes particles from air that is dirty. The nasal membranes accomplish all three tasks by producing mucus that is warm, moist, and sticky.  Although the nose is a guardian of the more delicate lungs, it is subject to problems of its own. Viruses, allergies, sinusitis, dust particles, and airborne chemicals can all irritate the nasal membranes. The membranes respond to injury by producing more mucus - and unlike normal mucus, it's thin, watery, and runny.  All that mucus has to go somewhere. When it drips out the nose, it's a nuisance. But when it drips down the throat, it tickles the nerves of the nasopharynx, triggering a cough. In some cases, the nose itself is to blame (rhinitis), but in others, a prolonged postnasal drip lingers after a viral upper respiratory infection; some call this variety a post-infectious cough.  In typical cases, patients with postnasal drip cough more at night, and they are often aware of a tickling feeling at the back of their throats. But they can cough during the day, and their throats may be irritated and sore or perfectly fine.  The best way to find out if a chronic cough is the result of postnasal drip is to try treatment. Nonprescription decongestant or antihistamine tablets are the first step. Most contain a decongestant such as pseudoephedrine or phenylephrine, an antihistamine such as chlorpheniramine or diphenhydramine, or a combination of the two. In one form or another, these medications are generally effective and safe, but some people complain of a racing heart and souped-up feeling (due to the decongestant), while others feel sleepy (due to the antihistamine). Men with benign prostatic hyperplasia (BPH) may have difficulty passing urine while they're taking decongestants, and antihistamines can occasionally trigger acute glaucoma. As with all medications, read the directions carefully.  Home remedies can help as well. Inhaling steam from a hot shower or  kettle is the simplest. Nasal irrigations/ sinus rinses may also help by cleaning out irritating secretions. You can purchase saline nose sprays at your drugstore or you can do it yourself.  First, soak a clean washcloth in a basin containing ? teaspoon of table salt for each cup of water.  Next, hold the dripping wet cloth up to your nostrils and sniff in the saline solution.  If saline irrigations seem to help, repeat them one to three times per day.  If decongestants and nasal irrigations don't stop your cough, you may not have a postnasal drip.  But don't give up just yet: nasal sprays are worth a try. Avoid over-the-counter decongestant sprays such as those containing phenylephrine or oxymetazoline; they can help for a day or two, but they're too irritating for long-term use. Instead, ask your doctor for about a nasal steroid spray such as beclomethasone (Beconase AQ) or triamcinolone (Nasacort AQ). Steroid sprays are safe and effective, but if they don't do the job, your doctor may prescribe a different type of spray such as ipratropium (Atrovent).  Although antibiotics are sometimes prescribed for the lingering cough due to postnasal drip, they are not helpful.  And while most cases of sinusitis respond to decongestants and humidification, some do need antibiotics - but it should be easy to spot the postnasal drip of sinusitis, which consists of thick, gluey fluid that is often foul-tasting. In addition, many people with bacterial sinusitis have   pain behind their eyes or over their forehead or cheeks, and have a fever.  Postnasal drip is the leading cause of the lingering cough. But it's far from the only cause.   2. Asthma.  Wheezing and breathlessness are the usual symptoms of asthma. But not all patients with asthma wheeze. Indeed, some just cough.  Asthma results from bronchospasm, the temporary, reversible narrowing of the medium-sized tubes that carry air into the lungs. In most cases, that  air makes a whistling or wheezing sound as it moves through narrowed passages. Excessive mucus production, shortness of breath, and cough are the other classic symptoms of asthma. But in cough-variant asthma, coughing is the only symptom. It sounds exotic, but it's far from rare. In fact, asthma accounts for about one-quarter of all chronic coughs.  In most cases, cough-variant asthma produces a persistent, dry cough that occurs around the clock but may begin at night. Exposure to allergens, dust, or cold air often triggers coughing, as does exercise.  If doctors suspect that asthma is responsible for a chronic cough, they can order pulmonary function tests to confirm the diagnosis; if these tests are inconclusive, patients may be asked to inhale small doses of methacholine, a drug that often triggers wheezing in asthmatics.  Another approach to the diagnosis of cough-variant asthma is to see if the cough responds to anti-asthmatic treatment. Doctors often suggest a bronchodilator spray such as albuterol (Proventil, Ventoline). It's short acting. So, in addition your doctor might prescribe an inhaled cortico steroid, such as fluticason (Flovent), triamcinolone (Azmacort) or budesonide (Pulmicort).  If you have a chronic cough that may be due to asthma, ask your doctor to consider testing or treating. But if asthma is not the answer, ask him to think about the third leading cause of the cough that lingers.   3. Gastroesophageal reflux disease.  Most people are surprised to learn that asthma can cause coughing without wheezing; most are shocked to learn that gastroesophageal reflux disease (GERD) can cause coughing without heartburn.  GERD occurs when stomach contents travel upstream, making their way up into the esophagus instead of down into the intestines. Heartburn is the usual symptom; belching, a sour taste in the mouth, and bad breath are common too. But acid also irritates nerves in the lower  esophagus, and these nerves can trigger the cough reflex even without the distress signal of pain. In fact, about one-third of all patients with GERD are pain-free, complaining instead of cough, recurrent laryngitis, or unexplained sore throats.  GERD can be tricky to diagnose when there's no pain. Barium swallow x-rays and esophagoscopy can help, but the gold standard is esophageal pH monitoring, in which the patient swallows a probe that remains in the lower esophagus for 24 hours to detect the presence of acid.  It's not as uncomfortable as it sounds, but it is expensive and inconvenient.  As with the other causes of chronic cough, a simpler approach to diagnosis is to try treatment.  You can begin on your own.  Avoid alcohol and foods that often trigger GERD, including those that contain chocolate, peppermint, caffeine, garlic, onions, citrus fruits, tomato sauce, or lots of fat.  Eat small meals, and never lie down until two hours after you've eaten.  Take liquid antacids, particularly at bedtime, and consider elevating the head of your bed or sleeping on a wedge-shaped pillow to keep your stomach's contents flowing down at night.  If you're constantly coughing after a week or so, you can   add an over-the-counter acid suppressor. Today there are many to choose from, including ranitidine (Zantac), cimetidine (Tagamet), famotidine (Pepcid), omeprazole (Prilosec) and lansoprazole (Prevacid). Stronger versions are available by prescription.   When coughing for any reason, you can start taking nexium 30-60 min before first meal of the day and pepcid 20 mg at bedtime.   It may take three or four weeks of gradually escalating therapy to control GERD. But if your program doesn't work, you are probably coughing for some other reason.   4. Chronic bronchitis  Is persistent inflammation of the bronchial tubes, usually resulting from tobacco abuse or long-term exposure to high levels of industrial air  pollutants. Bronchiectasis is a chronic infection that also damages the walls of the bronchial tubes; it has become much less common since the advent of antibiotics. In either variant, the chronic inflammation irritates the airways and produces excess mucus, causing a chronic cough. The most effective treatment is to quit smoking and avoid air pollutants. In addition, your doctor can prescribe a corticosteroid inhaler, usually with a long-acting bronchodilator. People with chronic bronchitis are prone to flare-ups. Doctors call them COPD exacerbations. It's easy to spot because the cough gets a lot worse and the mucus becomes thicker and darker. Also COPD exacerbations causes shortness of breath and sometimes fever. The treatment includes antibiotics and an oral corticosteroid, usually prednisone.   5. Therapy with angiotensin-converting-enzyme (ACE) inhibitors.  Your doctor is also the key to this increasingly common cause of chronic cough, since the drug that he prescribed is to blame. Introduced in the 1980s, ACE inhibitors such as enalapril (Vasotec, generic), lisinopril (Prinivil, Zestril, generic), as well as many others, have assumed a prominent role in the treatment of high blood pressure. In the 1990s these medications also became important tools in the treatment of heart failure and heart attacks.  ACE inhibitors are favored by many doctors because they produce good results and have few side effects, with one exception -- a persistent cough. It occurs in up to 20% of people taking an ACE inhibitor. The first symptom is often just a throat tickle, followed by a dry cough that can begin as soon as three weeks or as late as a year after the medication is started. Once the cough starts, it lingers and lingers.  If the cough is mild, patients may choose to continue their medication, or they may cough less if they change to a different ACE inhibitor.  But the only way to eliminate a severe cough induced  by an ACE inhibitor is to switch to another type of antihypertensive medication.  Fortunately, many are available, including angiotensin-receptor blockers (ARBs) like losartan (Cozaar) and valsartan (Diovan) - drugs that act like ACE inhibitors without causing a cough.  When to worry about a cough Although a chronic cough is usually not serious, warning symptoms call for prompt medical care. The symptoms include:  Fever, especially if it's high or prolonged  Copious sputum production  Coughing up blood  Shortness of breath  Weight loss  New Onset of unexplained weakness, fatigue, loss of appetite  Chest pain that's not caused by the cough itself  Night sweats  Wheezing   Less common causes In nonsmokers, the Big Five account for more than nine of every 10 chronic coughs. But other problems can - and do - cause lingering coughs.  - Lung infections make people cough. But most cases of pneumonia are acute infections requiring rapid diagnosis and treatment. Lung infections caused by mycoplasma,   chlamydia, and tuberculosis, however, can be more indolent and can cause a persistent cough. Fever is the important clue to infectious causes of persistent coughing; patients with bacterial infections also raise thick, dark-colored sputum that is sometimes tinged with blood.  - Pertussis (whooping cough) is a respiratory tract infection that can cause serious problems in children who have not been immunized properly with diphtheria-pertussis-tetanus (DPT) vaccine. Pertussis began to resurface in adolescents and adults because the original tetanus-diphtheria booster shots did not cover pertussis and the vaccine's effectiveness wears off over time.  - Heart disease can masquerade as lung disease if coughing and breathlessness are its main symptoms. It's a common occurrence in patients with heart failure (HF). Patients with HF usually have a history of heart disease. Their cough is most pronounced  when they're lying flat, so they often resort to sleeping propped up on three or four pillows. The cough of HF may be dry, or it may produce thin, frothy white sputum. Leg swelling, fatigue, and exercise intolerance are other common symptoms of HF.  - Abnormal swallowing can lead to persistent coughing if food triggers the cough reflex by heading down the "windpipe" instead of the "food pipe." Called aspiration, the problem occurs mainly in people with strokes or other neurologic disorders that hamper normal swallowing.  - Environmental irritants can trigger the cough reflex, not just once but with nearly every breath of air laden with chemicals or particles ranging from sulfur dioxide to nitric oxide to dust and molds. Even clean air can trigger coughing if it is too dry or too cold.  - Lung cancer certainly belongs on the list of disorders that cause persistent coughing. Fortunately, though, it's not high on the list, at least in nonsmokers. Still, even in nonsmokers, bloody sputum (hemoptysis) or chest pain should raise concern about a lung tumor.  - Stress. Mental factors can produce many physical symptoms, including cough. Psychogenic coughing increases at times of stress and disappears during sleep. The cough itself is innocent, but it can sometimes signal serious emotional problems.   Cough medicine If you don't think that coughing is a common complaint, just head to the nearest drugstore. You'll find a bewildering array of syrups, sprays, tablets, and lozenges designed to control coughing. You'll also see a steady stream of customers coughing up lots of money to purchase products that may be ineffective. In all, Americans spend about $3.5 billion a year on over-the-counter and prescription cough remedies.  Many cough remedies contain expectorants, compounds intended to loosen sputum, making it easier to clear. Guaifenesin is the most popular expectorant. Unfortunately, there is little  scientific evidence that expectorants are effective. You'll probably do just as well by using a humidifier and drinking lots of water.  Cough suppressants are also very popular. Nonprescription agents such as dextromethorphan can partially suppress the cough reflex, promoting patient comfort. Prescription cough syrups with codeine tend to be more effective. When used appropriately, cough suppressants can reduce discomfort; remember, though, that because coughing can serve a useful function, it should not always be suppressed.  Many over-the-counter cough remedies contain additional ingredients. Antihistamines can be helpful if an allergy or postnasal drip is responsible for the cough, but they are often used for other coughs. Antihistamines dry out secretions, making them harder to expel and worsening sinusitis. They often cause sleepiness, an advantage only at night. Decongestants are present in many cough remedies, but they make sense only for patients who are coughing because of postnasal drip or sinusitis.   benzocaine are sometimes part of the mix; although they are intended to quiet the nerves that trigger the cough reflex, they are of dubious value. Alcohol is present in some cough syrups but has no direct benefit.  Medicated lozenges and cough drops are among the most widely sold cough remedies. These products contain various combinations of menthol, camphor, eucalyptus oil, honey, and other ingredients. Like with liquid cough medicines, some also contain topical anesthetics. Despite their popularity, there is no evidence that medicated cough drops are more effective than simple hard candies.  Finding causes and cures Don't ignore a chronic cough - but don't panic just because your cough lingers for more than three or four weeks.  Most often, the puzzle can be solved without elaborate tests, and the problem can be corrected with simple treatments.  In fact, you may be able to diagnose and  treat yourself, especially if postnasal drip or gastroesophageal reflux is the culprit.  Even so, you should turn to your doctor for help and guidance.  In most cases, it won't take much more than a stethoscope and a treatment trial or two.    But if your cough is accompanied by sputum production, bloody sputum, fever, weight loss, night sweats, breathlessness, undue fatigue, or chest pain, you should consult your doctor without delay.  You can expect tests ranging from sputum exams and chest x-rays to pulmonary function tests, CT scans, and bronchoscopies.  In most cases, you'll get good news - and you can expect to get a treatment program that will quiet your nagging cough.       Muscle Strain A muscle strain (pulled muscle) happens when a muscle is stretched beyond normal length. It happens when a sudden, violent force stretches your muscle too far. Usually, a few of the fibers in your muscle are torn. Muscle strain is common in athletes. Recovery usually takes 1-2 weeks. Complete healing takes 5-6 weeks. Follow these instructions at home:  Follow the PRICE method of treatment to help your injury get better. Do this the first 2-3 days after the injury: ? Protect. Protect the muscle to keep it from getting injured again. ? Rest. Limit your activity and rest the injured body part. ? Ice. Put ice in a plastic bag. Place a towel between your skin and the bag. Then, apply the ice and leave it on from 15-20 minutes each hour. After the third day, switch to moist heat packs. ? Compression. Use a splint or elastic bandage on the injured area for comfort. Do not put it on too tightly. ? Elevate. Keep the injured body part above the level of your heart.  Only take medicine as told by your doctor.  Warm up before doing exercise to prevent future muscle strains. Contact a doctor if:  You have more pain or puffiness (swelling) in the injured area.  You feel numbness, tingling, or notice a loss of  strength in the injured area. This information is not intended to replace advice given to you by your health care provider. Make sure you discuss any questions you have with your health care provider. Document Released: 11/25/2007 Document Revised: 07/24/2015 Document Reviewed: 09/14/2012 Elsevier Interactive Patient Education  2017 [place].    Gastroesophageal Reflux Disease, Adult Normally, food travels down the esophagus and stays in the stomach to be digested. However, when a person has gastroesophageal reflux disease (GERD), food and stomach acid move back up into the esophagus. When this happens, the esophagus becomes sore and inflamed.  Over time, GERD can create small holes (ulcers) in the lining of the esophagus. What are the causes? This condition is caused by a problem with the muscle between the esophagus and the stomach (lower esophageal sphincter, or LES). Normally, the LES muscle closes after food passes through the esophagus to the stomach. When the LES is weakened or abnormal, it does not close properly, and that allows food and stomach acid to go back up into the esophagus. The LES can be weakened by certain dietary substances, medicines, and medical conditions, including:  Tobacco use.  Pregnancy.  Having a hiatal hernia.  Heavy alcohol use.  Certain foods and beverages, such as coffee, chocolate, onions, and peppermint.  What increases the risk? This condition is more likely to develop in:  People who have an increased body weight.  People who have connective tissue disorders.  People who use NSAID medicines.  What are the signs or symptoms? Symptoms of this condition include:  Heartburn.  Difficult or painful swallowing.  The feeling of having a lump in the throat.  Abitter taste in the mouth.  Bad breath.  Having a large amount of saliva.  Having an upset or bloated stomach.  Belching.  Chest pain.  Shortness of breath or  wheezing.  Ongoing (chronic) cough or a night-time cough.  Wearing away of tooth enamel.  Weight loss.  Different conditions can cause chest pain. Make sure to see your health care provider if you experience chest pain. How is this diagnosed? Your health care provider will take a medical history and perform a physical exam. To determine if you have mild or severe GERD, your health care provider may also monitor how you respond to treatment. You may also have other tests, including:  An endoscopy toexamine your stomach and esophagus with a small camera.  A test thatmeasures the acidity level in your esophagus.  A test thatmeasures how much pressure is on your esophagus.  A barium swallow or modified barium swallow to show the shape, size, and functioning of your esophagus.  How is this treated? The goal of treatment is to help relieve your symptoms and to prevent complications. Treatment for this condition may vary depending on how severe your symptoms are. Your health care provider may recommend:  Changes to your diet.  Medicine.  Surgery.  Follow these instructions at home: Diet  Follow a diet as recommended by your health care provider. This may involve avoiding foods and drinks such as: ? Coffee and tea (with or without caffeine). ? Drinks that containalcohol. ? Energy drinks and sports drinks. ? Carbonated drinks or sodas. ? Chocolate and cocoa. ? Peppermint and mint flavorings. ? Garlic and onions. ? Horseradish. ? Spicy and acidic foods, including peppers, chili powder, curry powder, vinegar, hot sauces, and barbecue sauce. ? Citrus fruit juices and citrus fruits, such as oranges, lemons, and limes. ? Tomato-based foods, such as red sauce, chili, salsa, and pizza with red sauce. ? Fried and fatty foods, such as donuts, french fries, potato chips, and high-fat dressings. ? High-fat meats, such as hot dogs and fatty cuts of red and white meats, such as rib eye  steak, sausage, ham, and bacon. ? High-fat dairy items, such as whole milk, butter, and cream cheese.  Eat small, frequent meals instead of large meals.  Avoid drinking large amounts of liquid with your meals.  Avoid eating meals during the 2-3 hours before bedtime.  Avoid lying down right after you eat.  Do not exercise  right after you eat. General instructions  Pay attention to any changes in your symptoms.  Take over-the-counter and prescription medicines only as told by your health care provider. Do not take aspirin, ibuprofen, or other NSAIDs unless your health care provider told you to do so.  Do not use any tobacco products, including cigarettes, chewing tobacco, and e-cigarettes. If you need help quitting, ask your health care provider.  Wear loose-fitting clothing. Do not wear anything tight around your waist that causes pressure on your abdomen.  Raise (elevate) the head of your bed 6 inches (15cm).  Try to reduce your stress, such as with yoga or meditation. If you need help reducing stress, ask your health care provider.  If you are overweight, reduce your weight to an amount that is healthy for you. Ask your health care provider for guidance about a safe weight loss goal.  Keep all follow-up visits as told by your health care provider. This is important. Contact a health care provider if:  You have new symptoms.  You have unexplained weight loss.  You have difficulty swallowing, or it hurts to swallow.  You have wheezing or a persistent cough.  Your symptoms do not improve with treatment.  You have a hoarse voice. Get help right away if:  You have pain in your arms, neck, jaw, teeth, or back.  You feel sweaty, dizzy, or light-headed.  You have chest pain or shortness of breath.  You vomit and your vomit looks like blood or coffee grounds.  You faint.  Your stool is bloody or black.  You cannot swallow, drink, or eat. This information is not  intended to replace advice given to you by your health care provider. Make sure you discuss any questions you have with your health care provider. Document Released: 11/25/2004 Document Revised: 07/16/2015 Document Reviewed: 06/12/2014 Elsevier Interactive Patient Education  2018 [place] for Gastroesophageal Reflux Disease, Adult When you have gastroesophageal reflux disease (GERD), the foods you eat and your eating habits are very important. Choosing the right foods can help ease the discomfort of GERD. Consider working with a diet and nutrition specialist (dietitian) to help you make healthy food choices. What general guidelines should I follow? Eating plan  Choose healthy foods low in fat, such as fruits, vegetables, whole grains, low-fat dairy products, and lean meat, fish, and poultry.  Eat frequent, small meals instead of three large meals each day. Eat your meals slowly, in a relaxed setting. Avoid bending over or lying down until 2-3 hours after eating.  Limit high-fat foods such as fatty meats or fried foods.  Limit your intake of oils, butter, and shortening to less than 8 teaspoons each day.  Avoid the following: ? Foods that cause symptoms. These may be different for different people. Keep a food diary to keep track of foods that cause symptoms. ? Alcohol. ? Drinking large amounts of liquid with meals. ? Eating meals during the 2-3 hours before bed.  Cook foods using methods other than frying. This may include baking, grilling, or broiling. Lifestyle   Maintain a healthy weight. Ask your health care provider what weight is healthy for you. If you need to lose weight, work with your health care provider to do so safely.  Exercise for at least 30 minutes on 5 or more days each week, or as told by your health care provider.  Avoid wearing clothes that fit tightly around your waist and chest.  Do not  use any products that contain nicotine or tobacco,  such as cigarettes and e-cigarettes. If you need help quitting, ask your health care provider.  Sleep with the head of your bed raised. Use a wedge under the mattress or blocks under the bed frame to raise the head of the bed. What foods are not recommended? The items listed may not be a complete list. Talk with your dietitian about what dietary choices are best for you. Grains Pastries or quick breads with added fat. [place] toast. Vegetables Deep fried vegetables. [place] fries. Any vegetables prepared with added fat. Any vegetables that cause symptoms. For some people this may include tomatoes and tomato products, chili peppers, onions and garlic, and horseradish. Fruits Any fruits prepared with added fat. Any fruits that cause symptoms. For some people this may include citrus fruits, such as oranges, grapefruit, pineapple, and lemons. Meats and other protein foods High-fat meats, such as fatty beef or pork, hot dogs, ribs, ham, sausage, salami and bacon. Fried meat or protein, including fried fish and fried chicken. Nuts and nut butters. Dairy Whole milk and chocolate milk. Sour cream. Cream. Ice cream. Cream cheese. Milk shakes. Beverages Coffee and tea, with or without caffeine. Carbonated beverages. Sodas. Energy drinks. Fruit juice made with acidic fruits (such as orange or grapefruit). Tomato juice. Alcoholic drinks. Fats and oils Butter. Margarine. Shortening. Ghee. Sweets and desserts Chocolate and cocoa. Donuts. Seasoning and other foods Pepper. Peppermint and spearmint. Any condiments, herbs, or seasonings that cause symptoms. For some people, this may include curry, hot sauce, or vinegar-based salad dressings. Summary  When you have gastroesophageal reflux disease (GERD), food and lifestyle choices are very important to help ease the discomfort of GERD.  Eat frequent, small meals instead of three large meals each day. Eat your meals slowly, in a relaxed setting. Avoid bending  over or lying down until 2-3 hours after eating.  Limit high-fat foods such as fatty meat or fried foods. This information is not intended to replace advice given to you by your health care provider. Make sure you discuss any questions you have with your health care provider. Document Released: 02/15/2005 Document Revised: 02/17/2016 Document Reviewed: 02/17/2016 Elsevier Interactive Patient Education  [employer].

## 2017-08-02 NOTE — Progress Notes (Signed)
Impression and Recommendations:    1. Muscle spasms of neck   2. Muscle spasm of lower bilateral back   3. Atypical nevi- left upper back with itching   4. Reflux esophagitis   5. Persistent dry cough   6. Hoarseness or changing voice     1. Paravertebral Lumbar Spasm - Flexeril prescribed today.    - Advised patient to monitor for drowsiness or other side-effects, starting by taking a half tablet of flexeril in the morning, half tablet in the afternoon, and a full tablet at night.  If the patient experiences drowsiness, she should only take the medication at night.  - Continue to ice the aggravated area 15-20 minutes per hour, as often as tolerated.  - Encouraged patient to walk around to increase blood flow to the area.  Educated patient that this will help bring oxygenated blood to the area, loosen up the muscles, and flush lactic acid from the area.  - Avoid intensive exercise; put off moving plants, gardening, bending over for long periods of time, and standing in place doing the dishes.  Educated patient on techniques to avoid aggravating her back pain.  - Advised patient that with proper use of NSAID's, muscle relaxers, ice, the pain should show improvement in 2-3 weeks.  - If patient is not feeling better in 2-3 weeks, next step would be referral to an interventionalist.  2. Suspected Reflux - Trial of ranitidine and omeprazole prescribed today. - Omeprazole will be used for 4 weeks and discontinued.   - Ranitidine can be continued as long as needed.  - Educated patient about the actions and side-effects of these medications.  - Return in 6 weeks for evaluation and assessment of symptoms.  3. Follow-Up - Encouraged patient to follow up with Dr. Chalmers [name] of endocrinology, especially given her known goiter.   - Return to clinic for procedure for skin bx if desired or pt will go to a Dermatologist  - If in 2-3 weeks pain has not improved, patient should call us for  referral to physical therapy    Meds ordered this encounter  Medications  . omeprazole (PRILOSEC) 20 MG capsule    Sig: Take 1 capsule (20 mg total) by mouth daily.    Dispense:  30 capsule    Refill:  1  . ranitidine (ZANTAC) 300 MG tablet    Sig: One half to 1 tablet twice daily for reflux    Dispense:  180 tablet    Refill:  3  . cyclobenzaprine (FLEXERIL) 10 MG tablet    Sig: Take 1 tablet (10 mg total) by mouth 3 (three) times daily as needed for muscle spasms.    Dispense:  30 tablet    Refill:  0  . naproxen (NAPROSYN) 500 MG tablet    Sig: Take 1 tablet (500 mg total) by mouth 2 (two) times daily with a meal. PRN back or neck pain    Dispense:  30 tablet    Refill:  0    Gross side effects, risk and benefits, and alternatives of medications and treatment plan in general discussed with patient.  Patient is aware that all medications have potential side effects and we are unable to predict every side effect or drug-drug interaction that may occur.   Patient will call with any questions prior to using medication if they have concerns.  Expresses verbal understanding and consents to current therapy and treatment regimen.  No barriers to understanding were identified.  Red flag symptoms and signs discussed in detail.  Patient expressed understanding regarding what to do in case of emergency\urgent symptoms  Please see AVS handed out to patient at the end of our visit for further patient instructions/ counseling done pertaining to today's office visit.   Return for Follow-up near future for biopsy skin lesion, 2-3 weeks as needed back pain, 6 wks reflux\hoarse.    Note: This note was prepared with assistance of Dragon voice recognition software. Occasional wrong-word or sound-a-like substitutions may have occurred due to the inherent limitations of voice recognition software.   This document serves as a record of services personally performed by Mellody Dance, DO. It was created  on her behalf by Toni Amend, a trained medical scribe. The creation of this record is based on the scribe's personal observations and the provider's statements to them.   I have reviewed the above medical documentation for accuracy and completeness and I concur.  Mellody Dance 08/07/17 8:51 PM   ---------------------------------------------------------------------------------------------------------------------------------------------   Subjective:     HPI: Lindsay [name] is a 72 y.o. female who presents to [place] at [facility] today for issues as discussed below.  Paravertebral Muscle Spasm Patient has had terrible back pain for two weeks.  Notes neck trouble for a long time, which she thought was related to a pillow.  She changed the pillow, which helped, but still has some amount of neck pain.  Patient has had back pain in the past.  Patient notes that her current pain is located in the bilateral lower back.  Patient was moving a plant at onset and felt a pulling sensation.  She stopped moving the plant, and notes that the back pain started almost immediately after she moved the plant.  Notes that the pain has been lasting for 2 weeks and she's really nursed it, iced it, and been taking care of it, but she still has to do things because she lives alone.  Sometimes nothing aggravates the area, "it just hurts."  It hurts in particular when she bends over.  Sitting sometimes is uncomfortable; remarks that it's hard to find a comfortable position.  Notes that she's been sleeping, but it was a little tough at first when she first was injured.  Lying on her back seemed to relieve the pain.  The quality of the pain is dull and aching instead of sharp, stabbing, or cramping.  No radiation; pain stays in the bilateral lower back, more on the right than the left.  Bending over does not worsen the pain, straightening back up felt worse.  Feeling of Malaise -  Hoarseness, Cough, & Scratchy Throat Notes that she's concerned because she has so many friends getting sick from cancer.  Generally feels worried.  Has a goiter they've been monitoring for years, and started having a scratchy throat and hoarseness, itchy throat, and coughing recently.  Patient is mainly concerned about her hoarseness and cough.  She follows up with Dr. Chalmers [name] for endocrinology.  Notes she's never had any digestive problems or GERD, but has occasionally felt pressure and something "like indigestion or heartburn."  Notes that this sensation only comes and goes.  Dermatological Concerns Patient notes that she's always had several moles along her back, but recently has an area that's been itching and concerning her.  She is interested in following up on this in the near future.   Wt Readings from Last 3 Encounters:  08/05/17 176 lb 4.8 oz (80 kg)  08/02/17 173 lb 12.8 oz (78.8 kg)  06/23/17 178 lb (80.7 kg)   BP Readings from Last 3 Encounters:  08/05/17 121/74  08/02/17 126/79  06/23/17 129/74   Pulse Readings from Last 3 Encounters:  08/05/17 89  08/02/17 74  06/23/17 73   BMI Readings from Last 3 Encounters:  08/05/17 30.26 kg/m  08/02/17 29.83 kg/m  06/23/17 30.55 kg/m     Patient Care Team    Relationship Specialty Notifications Start End  Mellody Dance, DO PCP - General Family Medicine  08/27/16   Adrian Prows, [place] Physician Cardiology  08/27/16   Evans Lance, MD Consulting Physician Cardiology  08/27/16   Juanita Craver, MD Consulting Physician Gastroenterology  08/27/16   Jacelyn Pi, MD Consulting Physician Endocrinology  08/27/16   Macarthur Critchley, [place] Referring Physician Optometry  08/27/16   Darleen Crocker, MD Consulting Physician Ophthalmology  08/27/16   Linton Rump, PT Physical Therapist Physical Therapy  08/27/16   Megan Salon, MD Consulting Physician Gynecology  08/27/16      Patient Active Problem List   Diagnosis Date Noted    . Urinary incontinence 10/08/2016    Priority: High  . Obesity 08/26/2010    Priority: High  . Vegan diet 08/27/2016    Priority: Medium  . Paroxysmal SVT (supraventricular tachycardia) ([place]) 10/06/2010    Priority: Medium  . Palpitations 08/26/2010    Priority: Medium  . Thyroid goiter 08/27/2016    Priority: Low  . Reflux esophagitis 08/02/2017  . Persistent dry cough 08/02/2017  . Muscle spasm of lower bilateral back 08/02/2017  . Muscle spasms of neck 08/02/2017  . Hoarseness or changing voice 08/02/2017  . Left knee pain 06/30/2017  . Osteoarthritis of knee 06/20/2017  . Cough in adult 03/07/2017  . Acute maxillary sinusitis 03/07/2017  . Chronic pain of left knee 10/25/2016  . Primary osteoarthritis of both knees 10/18/2016  . Chronic pain of both knees 10/08/2016  . Generalized OA 10/08/2016  . h/o Near syncope 08/20/2013  . h/o chronic Dizziness 08/20/2013  . Pain in joint, shoulder region 09/26/2012  . Biceps tendinopathy 09/26/2012  . Right wrist pain 09/26/2012    Past Medical history, Surgical history, Family history, Social history, Allergies and Medications have been entered into the medical record, reviewed and changed as needed.    Current Meds  Medication Sig  . aspirin EC 81 MG tablet Take 81 mg by mouth daily.  . cholecalciferol (VITAMIN D) 1000 units tablet Take 2,000 Units by mouth daily.  . Flaxseed, Linseed, (FLAX SEEDS PO) Take 30 mLs by mouth daily.  . folic acid (FOLVITE) 607 MCG tablet Take 400 mcg by mouth daily.  Marland Kitchen GINSENG PO Take by mouth daily.  Marland Kitchen levothyroxine (SYNTHROID, LEVOTHROID) 25 MCG tablet Take 1 tablet by mouth daily.  . Probiotic Product (PROBIOTIC FORMULA PO) Take 1 capsule by mouth daily. New chapter All-Flora.  . TURMERIC PO Take by mouth daily.   Current Facility-Administered Medications for the 08/02/17 encounter (Office Visit) with Mellody Dance, DO  Medication  . bupivacaine (MARCAINE) 0.5 % injection 2 mL  .  lidocaine (XYLOCAINE) 2 % (with pres) injection 20 mg  . methylPREDNISolone acetate (DEPO-MEDROL) injection 40 mg    Allergies:  No Known Allergies   Review of Systems:  A fourteen system review of systems was performed and found to be positive as per HPI.   Objective:   Blood pressure 126/79, pulse 74, height 5\' 4"  (1.626 m), weight 173 lb  12.8 oz (78.8 kg), SpO2 98 %. Body mass index is 29.83 kg/m. General:  Well Developed, well nourished, appropriate for stated age.  Neuro:  Alert and oriented,  extra-ocular muscles intact  HEENT:  Normocephalic, atraumatic, neck supple. Skin:  Left side paravertebral region around T-8 with 1.5-2 mm hypermelanotic slightly irregular nevi, not raised.  No gross rash, warm, pink. Cardiac:  RRR, S1 S2 Respiratory:  ECTA B/L and A/P, Not using accessory muscles, speaking in full sentences- unlabored. Vascular:  Ext warm, no cyanosis apprec.; cap RF less 2 sec. Psych:  No HI/SI, judgement and insight good, Euthymic mood. Full Affect. Back Exam:  Paravertebral spasms from T-11 to L-2, L-3, greatest on the left vs right, but it is bilaterally.

## 2017-08-03 ENCOUNTER — Telehealth: Payer: Self-pay | Admitting: Family Medicine

## 2017-08-03 NOTE — Telephone Encounter (Signed)
Received call from [place] @ [phone] request nurse/ med assistant call her to clarify dosage of:  ranitidine (ZANTAC) 300 MG tablet [[id number]]   Order Details  Dose, Route, Frequency: As Directed   Dispense Quantity: 180 tablet Refills: 3 Fills remaining: --        Sig: One half to 1 tablet twice daily for reflux          Pharmacy contact information  : Preferred Pharmacies      [place], [place]. [phone] (Phone) [phone] (Fax    --forwarding message to medical assistant.  -glh

## 2017-08-03 NOTE — Telephone Encounter (Signed)
Called and verified with the pharmacy. MPulliam, CMA/RT(R)

## 2017-08-05 ENCOUNTER — Other Ambulatory Visit (HOSPITAL_COMMUNITY)
Admission: RE | Admit: 2017-08-05 | Discharge: 2017-08-05 | Disposition: A | Discharge status: Home / Self Care | Payer: Medicare Other | Source: Ambulatory Visit | Attending: Family Medicine | Admitting: Family Medicine

## 2017-08-05 ENCOUNTER — Encounter: Payer: Self-pay | Admitting: Family Medicine

## 2017-08-05 ENCOUNTER — Ambulatory Visit (INDEPENDENT_AMBULATORY_CARE_PROVIDER_SITE_OTHER): Payer: Medicare Other | Admitting: Family Medicine

## 2017-08-05 VITALS — BP 121/74 | HR 89 | Ht 64.0 in | Wt 176.3 lb

## 2017-08-05 DIAGNOSIS — D225 Melanocytic nevi of trunk: Secondary | ICD-10-CM

## 2017-08-05 NOTE — Progress Notes (Signed)
Impression and Recommendations:    1. Atypical nevus of back     1. Atypical Nevi -Sample obtained and sent to pathology. Will alert pt with results. Will potentially refer to dermatology in future.   -allow the area to heal. Do not aggravate the area or put any lotions etc. on the area.     Indication: Atypical Nevi   Medical necessity statement:  On physical examination, irregular skin lesions present with atypical features worrisome for pre-cancerous/ cancerous condition  Consent:  Discussed benefits and risks of procedure and verbal consent obtained  Procedure:  Patient was prepped in sterile fashion for the procedure.  Injection of 2% lido w epi used for anesthesia. In sterile fashion, punch biopsy performed.  Hemostasis obtained with use of silver nitrate sticks and pressure to area.  Pt tolerated well. No complications  Blood Loss:  less than 1 cc  Post-Care Instructions:   Proper wound care discussed with patient.  If wound does not continue improve as expected, patient told to notify us immediately   Gross side effects, risk and benefits, and alternatives of medications and treatment plan in general discussed with patient.  Patient is aware that all medications have potential side effects and we are unable to predict every side effect or drug-drug interaction that may occur.   Patient will call with any questions prior to using medication if they have concerns.  Expresses verbal understanding and consents to current therapy and treatment regimen.  No barriers to understanding were identified.  Red flag symptoms and signs discussed in detail.  Patient expressed understanding regarding what to do in case of emergency\urgent symptoms  Please see AVS handed out to patient at the end of our visit for further patient instructions/ counseling done pertaining to today's office visit.   Return for f/up for chronic care as previosuly d/c pt.    Note: This note was prepared  with assistance of Dragon voice recognition software. Occasional wrong-word or sound-a-like substitutions may have occurred due to the inherent limitations of voice recognition software.   This document serves as a record of services personally performed by Mellody Dance, DO. It was created on her behalf by Mayer Masker, a trained medical scribe. The creation of this record is based on the scribe's personal observations and the provider's statements to them.   I have reviewed the above medical documentation for accuracy and completeness and I concur.  Mellody Dance 08/07/17 10:19 PM  --------------------------------------------------------------------------------------------------------------------------------------------------------------------------------------------------------------------------------------------    Subjective:     HPI: Lindsay [name] is a 72 y.o. female who presents to [place] at [facility] today for issues as discussed below.  She is here for a procedural visit of a biopsy of skin lesion about 1.5-2 mm found on her L upper back  She states the area is itchy.     Wt Readings from Last 3 Encounters:  08/05/17 176 lb 4.8 oz (80 kg)  08/02/17 173 lb 12.8 oz (78.8 kg)  06/23/17 178 lb (80.7 kg)   BP Readings from Last 3 Encounters:  08/05/17 121/74  08/02/17 126/79  06/23/17 129/74   Pulse Readings from Last 3 Encounters:  08/05/17 89  08/02/17 74  06/23/17 73   BMI Readings from Last 3 Encounters:  08/05/17 30.26 kg/m  08/02/17 29.83 kg/m  06/23/17 30.55 kg/m     Patient Care Team    Relationship Specialty Notifications Start End  Mellody Dance, DO PCP - General Family Medicine  08/27/16   Adrian Prows, [place] Physician Cardiology  08/27/16   Evans Lance, MD Consulting Physician Cardiology  08/27/16   Juanita Craver, MD Consulting Physician Gastroenterology  08/27/16   Jacelyn Pi, MD Consulting Physician  Endocrinology  08/27/16   Macarthur Critchley, [place] Referring Physician Optometry  08/27/16   Darleen Crocker, MD Consulting Physician Ophthalmology  08/27/16   Linton Rump, PT Physical Therapist Physical Therapy  08/27/16   Megan Salon, MD Consulting Physician Gynecology  08/27/16      Patient Active Problem List   Diagnosis Date Noted  . Urinary incontinence 10/08/2016    Priority: High  . Obesity 08/26/2010    Priority: High  . Vegan diet 08/27/2016    Priority: Medium  . Paroxysmal SVT (supraventricular tachycardia) ([place]) 10/06/2010    Priority: Medium  . Palpitations 08/26/2010    Priority: Medium  . Thyroid goiter 08/27/2016    Priority: Low  . Atypical nevus of back 08/07/2017  . Reflux esophagitis 08/02/2017  . Persistent dry cough 08/02/2017  . Muscle spasm of lower bilateral back 08/02/2017  . Muscle spasms of neck 08/02/2017  . Hoarseness or changing voice 08/02/2017  . Left knee pain 06/30/2017  . Osteoarthritis of knee 06/20/2017  . Cough in adult 03/07/2017  . Acute maxillary sinusitis 03/07/2017  . Chronic pain of left knee 10/25/2016  . Primary osteoarthritis of both knees 10/18/2016  . Chronic pain of both knees 10/08/2016  . Generalized OA 10/08/2016  . h/o Near syncope 08/20/2013  . h/o chronic Dizziness 08/20/2013  . Pain in joint, shoulder region 09/26/2012  . Biceps tendinopathy 09/26/2012  . Right wrist pain 09/26/2012    Past Medical history, Surgical history, Family history, Social history, Allergies and Medications have been entered into the medical record, reviewed and changed as needed.    Current Meds  Medication Sig  . aspirin EC 81 MG tablet Take 81 mg by mouth daily.  . cholecalciferol (VITAMIN D) 1000 units tablet Take 2,000 Units by mouth daily.  . cyclobenzaprine (FLEXERIL) 10 MG tablet Take 1 tablet (10 mg total) by mouth 3 (three) times daily as needed for muscle spasms.  . Flaxseed, Linseed, (FLAX SEEDS PO) Take 30 mLs by mouth daily.  .  folic acid (FOLVITE) 875 MCG tablet Take 400 mcg by mouth daily.  Marland Kitchen GINSENG PO Take by mouth daily.  Marland Kitchen levothyroxine (SYNTHROID, LEVOTHROID) 25 MCG tablet Take 1 tablet by mouth daily.  . naproxen (NAPROSYN) 500 MG tablet Take 1 tablet (500 mg total) by mouth 2 (two) times daily with a meal. PRN back or neck pain  . omeprazole (PRILOSEC) 20 MG capsule Take 1 capsule (20 mg total) by mouth daily.  . Probiotic Product (PROBIOTIC FORMULA PO) Take 1 capsule by mouth daily. New chapter All-Flora.  . ranitidine (ZANTAC) 300 MG tablet One half to 1 tablet twice daily for reflux  . TURMERIC PO Take by mouth daily.   Current Facility-Administered Medications for the 08/05/17 encounter (Office Visit) with Mellody Dance, DO  Medication  . bupivacaine (MARCAINE) 0.5 % injection 2 mL  . lidocaine (XYLOCAINE) 2 % (with pres) injection 20 mg  . methylPREDNISolone acetate (DEPO-MEDROL) injection 40 mg    Allergies:  No Known Allergies   Review of Systems:  A fourteen system review of systems was performed and found to be positive as per HPI.   Objective:   Blood pressure 121/74, pulse 89, height 5\' 4"  (1.626 m), weight 176 lb 4.8 oz (80  kg), SpO2 98 %. Body mass index is 30.26 kg/m. General:  Well Developed, well nourished, appropriate for stated age.  Neuro:  Alert and oriented,  extra-ocular muscles intact  HEENT:  Normocephalic, atraumatic, neck supple, no carotid bruits appreciated  Skin:  no gross rash, warm, pink. Cardiac:  RRR, S1 S2 Respiratory:  ECTA B/L and A/P, Not using accessory muscles, speaking in full sentences- unlabored. Vascular:  Ext warm, no cyanosis apprec.; cap RF less 2 sec. Psych:  No HI/SI, judgement and insight good, Euthymic mood. Full Affect.

## 2017-08-07 DIAGNOSIS — D225 Melanocytic nevi of trunk: Secondary | ICD-10-CM | POA: Insufficient documentation

## 2017-08-15 ENCOUNTER — Ambulatory Visit (INDEPENDENT_AMBULATORY_CARE_PROVIDER_SITE_OTHER): Payer: Medicare Other | Admitting: Family Medicine

## 2017-08-15 ENCOUNTER — Encounter: Payer: Self-pay | Admitting: Family Medicine

## 2017-08-15 ENCOUNTER — Telehealth: Payer: Self-pay | Admitting: Family Medicine

## 2017-08-15 VITALS — BP 138/85 | HR 77 | Ht 64.0 in | Wt 180.0 lb

## 2017-08-15 DIAGNOSIS — R29898 Other symptoms and signs involving the musculoskeletal system: Secondary | ICD-10-CM | POA: Insufficient documentation

## 2017-08-15 DIAGNOSIS — R6889 Other general symptoms and signs: Secondary | ICD-10-CM | POA: Insufficient documentation

## 2017-08-15 DIAGNOSIS — Z9889 Other specified postprocedural states: Secondary | ICD-10-CM

## 2017-08-15 DIAGNOSIS — I471 Supraventricular tachycardia: Secondary | ICD-10-CM | POA: Diagnosis not present

## 2017-08-15 DIAGNOSIS — R200 Anesthesia of skin: Secondary | ICD-10-CM | POA: Diagnosis not present

## 2017-08-15 DIAGNOSIS — F410 Panic disorder [episodic paroxysmal anxiety] without agoraphobia: Secondary | ICD-10-CM

## 2017-08-15 MED ORDER — ALPRAZOLAM 1 MG PO TABS: 1.0000 mg | ORAL_TABLET | ORAL | 0 refills | Status: DC | PRN

## 2017-08-15 NOTE — Progress Notes (Signed)
Impression and Recommendations:    1. h/o Paroxysmal SVT (supraventricular tachycardia) ([place])   2. S/P AV nodal ablation   3. newer onset Exercise intolerance * 4-6 mo   4. Complaints of transient weakness of bilateral lower extremity   5. Transient episodes of numbness of both lower extremities     1. Fatigue/exercise intolerance- new onset  -this is a new problem to me.  -Referral given to Dr. Einar Gip, cardiologist, who she has seen before in the past.   --Without further workup and treatment planned, risk to pt's morbidity  is high with possible prolonged functional impairment and poor health  outcomes.   2. Numbness/weakness -this is a new problem to me.  -MRI order of spine without contrast placed. -will consider future referral to neurology for further w/up of possible N-Msk dx if MRI is WNL.   --Without further workup and treatment planned, risk to pt's morbidity  is high with possible prolonged functional impairment and poor health  outcomes.   3. H/o paroxysmal SVT/ S/P AV nodal ablation -pt has cardiologists, Drs. Marcille Buffy, who she has seen before in the past.  -pt completely asymptomatic in office today.   Pt was in the office today for 32.5+ minutes, with over 50% time spent in face to face counseling of patients various medical conditions, treatment plans of those medical conditions including medicine management and lifestyle modification, strategies to improve health and well being; and in coordination of care. SEE ABOVE TREATMENT PLAN FOR DETAILS  Orders Placed This Encounter  Procedures  . MR Lumbar Spine Wo Contrast  . Ambulatory referral to Cardiology     Education and routine counseling performed. Handouts provided  Gross side effects, risk and benefits, and alternatives of medications and treatment plan in general discussed with patient.  Patient is aware that all medications have potential side effects and we are unable to predict every side  effect or drug-drug interaction that may occur.   Patient will call with any questions prior to using medication if they have concerns.  Expresses verbal understanding and consents to current therapy and treatment regimen.  No barriers to understanding were identified.  Red flag symptoms and signs discussed in detail.  Patient expressed understanding regarding what to do in case of emergency\urgent symptoms   Please see AVS handed out to patient at the end of our visit for further patient instructions/ counseling done pertaining to today's office visit.   Return 4-6 wks for chronic care of MMP- chronic hoarness (poss reflux); ex intol; b/l LExt wekanessd/numb, for (30) chronic cough/ possible gerd; exercise intol; b/l legs numb/weak.     Note: This document was prepared occasionally using Dragon voice recognition software and may include unintentional dictation errors in addition to a scribe.   This document serves as a record of services personally performed by Mellody Dance, DO. It was created on her behalf by Mayer Masker, a trained medical scribe. The creation of this record is based on the scribe's personal observations and the provider's statements to them.   I have reviewed the above medical documentation for accuracy and completeness and I concur.  Mellody Dance 08/15/17 3:57 PM  --------------------------------------------------------------------------------------------------------------------------------------------------------------------------------------------------------------------------------------------    Subjective:    CC:  Chief Complaint  Patient presents with  . Leg Swelling    HPI: Lindsay [name] is a 72 y.o. female who presents to [place] at [facility] today for numbness/weakness in bilateral legs, as well as chronic  fatigue.   She has a h/o SVT and was diagnosed by her cardiologist.  Numbness/weakness She complains of episodic  numbness/weakness in her bilateral knees and legs that began 3-4 months prior. She feels like her legs will "give out" on her. She feels "bad" and feverish. She denies fever. She states her symptoms are not regular and are random at times. They are happening more frequently and last 2-3 minutes at a time. She has not fallen as a result of her sx. When her sx resolve, they resolve on their own. She has no h/o back injury or chronic diseases. She denies urinary or bowel incontinence beyond baseline.   She has been bitten by ticks before. She has been tested before with negative results. She has concerns for Lymes disease.   Back She has been seen in office for back pain. Her back pain was relieved with muscle relaxers but now she has soreness in her bilateral flanks. She feels "foggy" after taking flexeril.   Fatigue She also complains of fatigue. She has exercise intolerance for 1-1.5 years, but this is worsening. She has associated SOB. She often has to rest or sit down due to fatigue/exhaustion. She has difficulty climbing steps or doing other exertional activities. She denies CP, chest tightness, jaw or shoulder pains.   She has a PMHx of SVT diagnosed in 2014 by Dr. Lovena Le, electophysiologist. She has had a stress test before.   She has not had any Sx related to her previous h/o SVT.    Problem  h/o Paroxysmal SVT (supraventricular tachycardia) ([facility])   S/p ablation in 2014--> no  Prob since; doesn't follow with Cards.    newer onset Exercise intolerance * 4-6 mo  Complaints of transient weakness of bilateral lower extremity  Transient episodes of numbness of both lower extremities  S/P Av Nodal Ablation     Wt Readings from Last 3 Encounters:  08/15/17 180 lb (81.6 kg)  08/05/17 176 lb 4.8 oz (80 kg)  08/02/17 173 lb 12.8 oz (78.8 kg)   BP Readings from Last 3 Encounters:  08/15/17 138/85  08/05/17 121/74  08/02/17 126/79   BMI Readings from Last 3 Encounters:  08/15/17 30.90  kg/m  08/05/17 30.26 kg/m  08/02/17 29.83 kg/m     Patient Care Team    Relationship Specialty Notifications Start End  Mellody Dance, DO PCP - General Family Medicine  08/27/16   Adrian Prows, [place] Physician Cardiology  08/27/16   Evans Lance, MD Consulting Physician Cardiology  08/27/16   Juanita Craver, MD Consulting Physician Gastroenterology  08/27/16   Jacelyn Pi, MD Consulting Physician Endocrinology  08/27/16   Macarthur Critchley, [place] Referring Physician Optometry  08/27/16   Darleen Crocker, MD Consulting Physician Ophthalmology  08/27/16   Linton Rump, PT Physical Therapist Physical Therapy  08/27/16   Megan Salon, MD Consulting Physician Gynecology  08/27/16      Patient Active Problem List   Diagnosis Date Noted  . Urinary incontinence 10/08/2016    Priority: High  . Obesity 08/26/2010    Priority: High  . Vegan diet 08/27/2016    Priority: Medium  . h/o Paroxysmal SVT (supraventricular tachycardia) ([place]) 10/06/2010    Priority: Medium  . Palpitations 08/26/2010    Priority: Medium  . Thyroid goiter 08/27/2016    Priority: Low  . newer onset Exercise intolerance * 4-6 mo 08/15/2017  . Complaints of transient weakness of bilateral lower extremity 08/15/2017  . Transient episodes of numbness of both  lower extremities 08/15/2017  . Atypical nevus of back 08/07/2017  . Reflux esophagitis 08/02/2017  . Persistent dry cough 08/02/2017  . Muscle spasm of lower bilateral back 08/02/2017  . Muscle spasms of neck 08/02/2017  . Hoarseness or changing voice 08/02/2017  . Left knee pain 06/30/2017  . Osteoarthritis of knee 06/20/2017  . Cough in adult 03/07/2017  . Acute maxillary sinusitis 03/07/2017  . Chronic pain of left knee 10/25/2016  . Primary osteoarthritis of both knees 10/18/2016  . Chronic pain of both knees 10/08/2016  . Generalized OA 10/08/2016  . h/o Near syncope 08/20/2013  . h/o chronic Dizziness 08/20/2013  . Pain in joint, shoulder region  09/26/2012  . Biceps tendinopathy 09/26/2012  . Right wrist pain 09/26/2012  . S/P AV nodal ablation 08/05/2011    Past Medical history, Surgical history, Family history, Social history, Allergies and Medications have been entered into the medical record, reviewed and changed as needed.    Current Meds  Medication Sig  . aspirin EC 81 MG tablet Take 81 mg by mouth daily.  . cholecalciferol (VITAMIN D) 1000 units tablet Take 2,000 Units by mouth daily.  . cyclobenzaprine (FLEXERIL) 10 MG tablet Take 1 tablet (10 mg total) by mouth 3 (three) times daily as needed for muscle spasms.  . Flaxseed, Linseed, (FLAX SEEDS PO) Take 30 mLs by mouth daily.  . folic acid (FOLVITE) 536 MCG tablet Take 400 mcg by mouth daily.  Marland Kitchen GINSENG PO Take by mouth daily.  Marland Kitchen levothyroxine (SYNTHROID, LEVOTHROID) 25 MCG tablet Take 1 tablet by mouth daily.  . naproxen (NAPROSYN) 500 MG tablet Take 1 tablet (500 mg total) by mouth 2 (two) times daily with a meal. PRN back or neck pain  . omeprazole (PRILOSEC) 20 MG capsule Take 1 capsule (20 mg total) by mouth daily.  . Probiotic Product (PROBIOTIC FORMULA PO) Take 1 capsule by mouth daily. New chapter All-Flora.  . ranitidine (ZANTAC) 300 MG tablet One half to 1 tablet twice daily for reflux  . TURMERIC PO Take by mouth daily.   Current Facility-Administered Medications for the 08/15/17 encounter (Office Visit) with Mellody Dance, DO  Medication  . bupivacaine (MARCAINE) 0.5 % injection 2 mL  . lidocaine (XYLOCAINE) 2 % (with pres) injection 20 mg  . methylPREDNISolone acetate (DEPO-MEDROL) injection 40 mg    Allergies:  No Known Allergies   Review of Systems: General:   Denies fever, chills, unexplained weight loss.  Optho/Auditory:   Denies visual changes, blurred vision/LOV Respiratory:   Denies wheeze, DOE more than baseline levels.  Cardiovascular:   Denies chest pain, palpitations, new onset peripheral edema  Gastrointestinal:   Denies nausea,  vomiting, diarrhea, abd pain.  Genitourinary: Denies dysuria, freq/ urgency, flank pain or discharge from genitals.  Endocrine:     Denies hot or cold intolerance, polyuria, polydipsia. Musculoskeletal:   Denies unexplained myalgias, joint swelling, unexplained arthralgias, gait problems.  Skin:  Denies new onset rash, suspicious lesions Neurological:     Denies dizziness, unexplained weakness, numbness  Psychiatric/Behavioral:   Denies mood changes, suicidal or homicidal ideations, hallucinations  Objective:   Blood pressure 138/85, pulse 77, height 5\' 4"  (1.626 m), weight 180 lb (81.6 kg), SpO2 99 %. Body mass index is 30.9 kg/m. General:  Well Developed, well nourished, appropriate for stated age.  Neuro:  Alert and oriented,  extra-ocular muscles intact  HEENT:  Normocephalic, atraumatic, neck supple Skin:  no gross rash, warm, pink. Cardiac:  RRR, S1 S2 Respiratory:  ECTA B/L and A/P, Not using accessory muscles, speaking in full sentences- unlabored. Vascular:  Ext warm, no cyanosis apprec.; cap RF less 2 sec. Psych:  No HI/SI, judgement and insight good, Euthymic mood. Full Affect.

## 2017-08-15 NOTE — Telephone Encounter (Signed)
Dr. Raliegh Scarlet sent medication in for patient. MPulliam, CMA/RT(R)

## 2017-08-15 NOTE — Patient Instructions (Signed)
Weakness Weakness is a lack of strength. You may feel weak all over your body (generalized), or you may feel weak in one specific part of your body (focal). Common causes of weakness include:  Infection and immune system disorders.  Physical exhaustion.  Internal bleeding or other blood loss that results in a lack of red blood cells (anemia).  Dehydration.  An imbalance in mineral (electrolyte) levels, such as potassium.  Heart disease, circulation problems, or stroke.  Other causes include:  Some medicines or cancer treatment.  Stress, anxiety, or depression.  Nervous system disorders.  Thyroid disorders.  Loss of muscle strength because of age or inactivity.  Poor sleep quality or sleep disorders.  The cause of your weakness may not be known. Some causes of weakness can be serious, so it is important to see your health care provider. Follow these instructions at home:  Rest as needed.  Try to get enough sleep. Talk to your health care provider about how much sleep you need each night.  Take over-the-counter and prescription medicines only as told by your health care provider.  Eat a healthy, well-balanced diet. This includes: ? Proteins to build muscles, such as lean meats and fish. ? Fresh fruits and vegetables. ? Carbohydrates to boost energy, such as whole grains.  Drink enough fluid to keep your urine clear or pale yellow.  Do strength exercises, such as arm curls and leg raises, for 30 minutes at least 2 days a week or as told by your health care provider.  Consider working with a physical therapist or trainer who can develop an exercise plan to help you gain muscle strength.  Keep all follow-up visits as told by your health care provider. This is important. Contact a health care provider if:  Your weakness does not improve or gets worse.  Your weakness affects your ability to think clearly.  Your weakness affects your ability to do your normal daily  activities. Get help right away if:  You develop sudden weakness.  You have trouble breathing or shortness of breath.  You have problems with your vision.  You have trouble talking or swallowing.  You have trouble standing or walking.  You have chest pain.  You are light-headed or lose consciousness. This information is not intended to replace advice given to you by your health care provider. Make sure you discuss any questions you have with your health care provider. Document Released: 02/15/2005 Document Revised: 03/13/2015 Document Reviewed: 12/06/2014 Elsevier Interactive Patient Education  2018 [employer].    Spinal Stenosis Spinal stenosis occurs when the open space (spinal canal) between the bones of your spine (vertebrae) narrows, putting pressure on the spinal cord or nerves. What are the causes? This condition is caused by areas of bone pushing into the central canals of your vertebrae. This condition may be present at birth (congenital), or it may be caused by:  Arthritic deterioration of your vertebrae (spinal degeneration). This usually starts around age 89.  Injury or trauma to the spine.  Tumors in the spine.  Calcium deposits in the spine.  What are the signs or symptoms? Symptoms of this condition include:  Pain in the neck or back that is generally worse with activities, particularly when standing and walking.  Numbness, tingling, hot or cold sensations, weakness, or weariness in your legs.  Pain going up and down the leg (sciatica).  Frequent episodes of falling.  A foot-slapping gait that leads to muscle weakness.  In more serious cases,  you may develop:  Problemspassing stool or passing urine.  Difficulty having sex.  Loss of feeling in part or all of your leg.  Symptoms may come on slowly and get worse over time. How is this diagnosed? This condition is diagnosed based on your medical history and a physical exam. Tests will also be  done, such as:  MRI.  CT scan.  X-ray.  How is this treated? Treatment for this condition often focuses on managing your pain and any other symptoms. Treatment may include:  Practicing good posture to lessen pressure on your nerves.  Exercising to strengthen muscles, build endurance, improve balance, and maintain good joint movement (range of motion).  Losing weight, if needed.  Taking medicines to reduce swelling, inflammation, or pain.  Assistive devices, such as a corset or brace.  In some cases, surgery may be needed. The most common procedure is decompression laminectomy. This is done to remove excess bone that puts pressure on your nerve roots. Follow these instructions at home: Managing pain, stiffness, and swelling  Do all exercises and stretches as told by your health care provider.  Practice good posture. If you were given a brace or a corset, wear it as told by your health care provider.  Do not do any activities that cause pain. Ask your health care provider what activities are safe for you.  Do not lift anything that is heavier than 10 lb (4.5 kg) or the limit that your health care provider tells you.  Maintain a healthy weight. Talk with your health care provider if you need help losing weight.  If directed, apply heat to the affected area as often as told by your health care provider. Use the heat source that your health care provider recommends, such as a moist heat pack or a heating pad. ? Place a towel between your skin and the heat source. ? Leave the heat on for 20-30 minutes. ? Remove the heat if your skin turns bright red. This is especially important if you are not able to feel pain, heat, or cold. You may have a greater risk of getting burned. General instructions  Take over-the-counter and prescription medicines only as told by your health care provider.  Do not use any products that contain nicotine or tobacco, such as cigarettes and e-cigarettes.  If you need help quitting, ask your health care provider.  Eat a healthy diet. This includes plenty of fruits and vegetables, whole grains, and low-fat (lean) protein.  Keep all follow-up visits as told by your health care provider. This is important. Contact a health care provider if:  Your symptoms do not get better or they get worse.  You have a fever. Get help right away if:  You have new or worse pain in your neck or upper back.  You have severe pain that cannot be controlled with medicines.  You are dizzy.  You have vision problems, blurred vision, or double vision.  You have a severe headache that is worse when you stand.  You have nausea or you vomit.  You develop new or worse numbness or tingling in your back or legs.  You have pain, redness, swelling, or warmth in your arm or leg. Summary  Spinal stenosis occurs when the open space (spinal canal) between the bones of your spine (vertebrae) narrows. This narrowing puts pressure on the spinal cord or nerves.  Spinal stenosis can cause numbness, weakness, or pain in the neck, back, and legs.  This condition may be  caused by a birth defect, arthritic deterioration of your vertebrae, injury, tumors, or calcium deposits.  This condition is usually diagnosed with MRIs, CT scans, and X-rays. This information is not intended to replace advice given to you by your health care provider. Make sure you discuss any questions you have with your health care provider. Document Released: 05/08/2003 Document Revised: 01/21/2016 Document Reviewed: 01/21/2016 Elsevier Interactive Patient Education  [employer].

## 2017-08-15 NOTE — Addendum Note (Signed)
Addended by: Marjory Sneddon on: 08/15/2017 04:03 PM   Modules accepted: Orders

## 2017-08-15 NOTE — Telephone Encounter (Signed)
Patient is getting sch for MRI ordered by Dr. Jenetta Downer. Patient has had an MRI in the past and has dealt with anxiety during the process and is hoping for something to be call in to help her with this. If approved please send to [facility].

## 2017-08-19 ENCOUNTER — Telehealth: Payer: Self-pay | Admitting: Family Medicine

## 2017-08-19 NOTE — Telephone Encounter (Signed)
Patient called states she just wanted Dr. Jenetta Downer to know she has Cancelled the MRI(feels it not appropriate for her right now.   --Patient wants to f/u with Cardiologist 1st to see what they think & if they confirm MRI is needed she will get one rescheduled.  --forwarding message to medical assistant to share with Provider.  --glh

## 2017-08-19 NOTE — Telephone Encounter (Signed)
Just an FYI from the patient. MPulliam, CMA/RT(R)

## 2017-08-26 ENCOUNTER — Other Ambulatory Visit: Payer: Medicare Other

## 2017-08-29 ENCOUNTER — Ambulatory Visit (INDEPENDENT_AMBULATORY_CARE_PROVIDER_SITE_OTHER): Payer: Medicare Other | Admitting: Family Medicine

## 2017-08-29 ENCOUNTER — Encounter: Payer: Self-pay | Admitting: Family Medicine

## 2017-08-29 VITALS — BP 124/80 | HR 73 | Ht 64.0 in | Wt 169.6 lb

## 2017-08-29 DIAGNOSIS — Z719 Counseling, unspecified: Secondary | ICD-10-CM

## 2017-08-29 DIAGNOSIS — Z0000 Encounter for general adult medical examination without abnormal findings: Secondary | ICD-10-CM | POA: Diagnosis not present

## 2017-08-29 NOTE — Progress Notes (Signed)
Subjective:   Lindsay [name] is a 72 y.o. female who presents for Medicare Annual (Subsequent) preventive examination.  Activities of Daily Living In your present state of health, do you have difficulty performing the following activities?  1- Driving - no 2- Managing money - no 3- Feeding yourself - no 4- Getting from the bed to the chair - no 5- Climbing a flight of stairs - no 6- Preparing food and eating - no 7- Bathing or showering - no 8- Getting dressed - no 9- Getting to the toilet - no 10- Using the toilet - no 11- Moving around from place to place - no  Patient states that she feels safe at home.   Fall Risk  08/29/2017 06/20/2017 08/27/2016  Falls in the past year? No No No   6CIT Screen 08/29/2017  What Year? 0 points  What month? 0 points  What time? 0 points  Count back from 20 0 points  Months in reverse 0 points  Repeat phrase 0 points  Total Score 0    Depression screen [facility] 2/9 08/29/2017 08/15/2017 08/02/2017 06/23/2017 06/20/2017  Decreased Interest 0 0 0 0 1  Down, Depressed, Hopeless 0 0 0 0 0  PHQ - 2 Score 0 0 0 0 1  Altered sleeping 0 0 0 0 0  Tired, decreased energy 0 3 1 1 1   Change in appetite 0 0 0 0 0  Feeling bad or failure about yourself  0 0 0 0 0  Trouble concentrating 0 0 0 0 0  Moving slowly or fidgety/restless 0 0 0 0 0  Suicidal thoughts 0 0 0 0 0  PHQ-9 Score 0 3 1 1 2   Difficult doing work/chores - - - Somewhat difficult -   Current Exercise Habits: The patient has a physically strenous job, but has no regular exercise apart from work.    Functional Status Survey: Is the patient deaf or have difficulty hearing?: No Does the patient have difficulty seeing, even when wearing glasses/contacts?: No Does the patient have difficulty concentrating, remembering, or making decisions?: No Does the patient have difficulty walking or climbing stairs?: No Does the patient have difficulty dressing or bathing?: No Does the patient have difficulty  doing errands alone such as visiting a doctor's office or shopping?: No      Objective:    Vitals: BP 124/80   Pulse 73   Ht 5\' 4"  (1.626 m)   Wt 169 lb 9.6 oz (76.9 kg)   SpO2 98%   BMI 29.11 kg/m   Body mass index is 29.11 kg/m.  Advanced Directives 12/17/2016 08/27/2016 08/05/2011 08/05/2011  Does Patient Have a Medical Advance Directive? No No Patient does not have advance directive Patient does not have advance directive  Would patient like information on creating a medical advance directive? - No - Patient declined - -  Pre-existing out of facility DNR order (yellow form or pink MOST form) - - No -    Tobacco Social History   Tobacco Use  Smoking Status Former Smoker  . Types: Cigarettes  . Last attempt to quit: 03/01/1970  . Years since quitting: 47.5  Smokeless Tobacco Never Used     Counseling given: Not Answered Medicare wellness intake form filled out by patient And reviewed with her.  It was absolutely negative.  Past Medical History:  Diagnosis Date  . Arthritis   . Chest pain   . Herpes   . History of hepatitis    and  mononucleosis  . Hypothyroidism   . Mononucleosis 1967  . Multinodular goiter   . Osteopenia   . Palpitations   . S/P AV nodal ablation 08/05/2011  . SVT (supraventricular tachycardia) ([place])   . Thyroid nodule    Past Surgical History:  Procedure Laterality Date  . BREAST CYST ASPIRATION Bilateral 12/15/2000  . CATARACT EXTRACTION W/ INTRAOCULAR LENS IMPLANT Bilateral 2014  . paragard iud     8/90  . SUPRAVENTRICULAR TACHYCARDIA ABLATION N/A 08/05/2011   Procedure: SUPRAVENTRICULAR TACHYCARDIA ABLATION;  Surgeon: Evans Lance, MD;  Location[facility] C[facility] LAB;  Service: Cardiovascular;  Laterality: N/A;  . TONSILLECTOMY     Family History  Problem Relation Age of Onset  . Dementia Mother 61       alive  . Transient ischemic attack Mother        hx of; has a pacemaker  . Hypothyroidism Mother   . Other Father 32       old age. Develop  CHF the last 4 months  . Other Sister 26       died of thymoma  . Hypothyroidism Sister   . Other Maternal Aunt        nasal tumor  . Heart attack Maternal Grandfather   . Hypothyroidism Maternal Aunt   . Stroke Paternal Grandfather   . Colon cancer Neg Hx   . Esophageal cancer Neg Hx   . Pancreatic cancer Neg Hx   . Prostate cancer Neg Hx   . Rectal cancer Neg Hx   . Stomach cancer Neg Hx    Social History   Socioeconomic History  . Marital status: Divorced    Spouse name: Not on file  . Number of children: 2  . Years of education: Not on file  . Highest education level: Not on file  Occupational History  . Not on file  Social Needs  . Financial resource strain: Not on file  . Food insecurity:    Worry: Not on file    Inability: Not on file  . Transportation needs:    Medical: Not on file    Non-medical: Not on file  Tobacco Use  . Smoking status: Former Smoker    Types: Cigarettes    Last attempt to quit: 03/01/1970    Years since quitting: 47.5  . Smokeless tobacco: Never Used  Substance and Sexual Activity  . Alcohol use: Yes    Alcohol/week: 0.0 oz    Comment: occasional  . Drug use: No  . Sexual activity: Never    Birth control/protection: Post-menopausal  Lifestyle  . Physical activity:    Days per week: Not on file    Minutes per session: Not on file  . Stress: Not on file  Relationships  . Social connections:    Talks on phone: Not on file    Gets together: Not on file    Attends religious service: Not on file    Active member of club or organization: Not on file    Attends meetings of clubs or organizations: Not on file    Relationship status: Not on file  Other Topics Concern  . Not on file  Social History Narrative  . Not on file    Outpatient Encounter Medications as of 08/29/2017  Medication Sig  . aspirin EC 81 MG tablet Take 81 mg by mouth daily.  . cholecalciferol (VITAMIN D) 1000 units tablet Take 2,000 Units by mouth daily.  .  Flaxseed, Linseed, (FLAX SEEDS PO) Take  30 mLs by mouth daily.  . folic acid (FOLVITE) 595 MCG tablet Take 400 mcg by mouth daily.  Marland Kitchen GINSENG PO Take by mouth daily.  Marland Kitchen levothyroxine (SYNTHROID, LEVOTHROID) 25 MCG tablet Take 1 tablet by mouth daily.  . Probiotic Product (PROBIOTIC FORMULA PO) Take 1 capsule by mouth daily. New chapter All-Flora.  . ranitidine (ZANTAC) 300 MG tablet One half to 1 tablet twice daily for reflux  . TURMERIC PO Take by mouth daily.  Marland Kitchen ALPRAZolam (XANAX) 1 MG tablet Take 1 tablet (1 mg total) by mouth as needed for anxiety (30-45 minutes prior to procedure). (Patient not taking: Reported on 08/29/2017)  . [DISCONTINUED] cyclobenzaprine (FLEXERIL) 10 MG tablet Take 1 tablet (10 mg total) by mouth 3 (three) times daily as needed for muscle spasms.  . [DISCONTINUED] naproxen (NAPROSYN) 500 MG tablet Take 1 tablet (500 mg total) by mouth 2 (two) times daily with a meal. PRN back or neck pain  . [DISCONTINUED] omeprazole (PRILOSEC) 20 MG capsule Take 1 capsule (20 mg total) by mouth daily.   Facility-Administered Encounter Medications as of 08/29/2017  Medication  . bupivacaine (MARCAINE) 0.5 % injection 2 mL  . lidocaine (XYLOCAINE) 2 % (with pres) injection 20 mg  . methylPREDNISolone acetate (DEPO-MEDROL) injection 40 mg    Activities of Daily Living In your present state of health, do you have any difficulty performing the following activities: 08/29/2017  Hearing? N  Vision? N  Difficulty concentrating or making decisions? N  Walking or climbing stairs? N  Dressing or bathing? N  Doing errands, shopping? N  Some recent data might be hidden    Patient Care Team: Mellody Dance, DO as PCP - General (Family Medicine) Adrian Prows, MD as Consulting Physician (Cardiology) Evans Lance, MD as Consulting Physician (Cardiology) Juanita Craver, MD as Consulting Physician (Gastroenterology) Jacelyn Pi, MD as Consulting Physician (Endocrinology) Macarthur Critchley, Esmeralda as  Referring Physician (Optometry) Darleen Crocker, MD as Consulting Physician (Ophthalmology) Linton Rump, PT as Physical Therapist (Physical Therapy) Megan Salon, MD as Consulting Physician (Gynecology)    Assessment:   This is a routine wellness examination for [place].  Exercise Activities and Dietary recommendations Current Exercise Habits: The patient has a physically strenous job, but has no regular exercise apart from work.  Goals    None      Fall Risk Fall Risk  08/29/2017 06/20/2017 08/27/2016  Falls in the past year? No No No   Is the patient's home free of loose throw rugs in walkways, pet beds, electrical cords, etc?   yes      Grab bars in the bathroom? no      Handrails on the stairs?   yes      Adequate lighting?   yes  Timed Get Up and Go performed: passed  Depression Screen PHQ 2/9 Scores 08/29/2017 08/15/2017 08/02/2017 06/23/2017  PHQ - 2 Score 0 0 0 0  PHQ- 9 Score 0 3 1 1      Cognitive Function     6CIT Screen 08/29/2017  What Year? 0 points  What month? 0 points  What time? 0 points  Count back from 20 0 points  Months in reverse 0 points  Repeat phrase 0 points  Total Score 0    Immunization History  Administered Date(s) Administered  . DTaP 03/02/2015  . Tdap 06/28/2007    Qualifies for Shingles Vaccine?yes, patient will check with insurance on the cost.  Screening Tests Health Maintenance  Topic Date Due  .  PNA vac Low Risk Adult (1 of 2 - PCV13) 08/30/2018 (Originally 07/08/2011)  . Hepatitis C Screening  08/26/2028 (Originally 01-Jun-1945)  . INFLUENZA VACCINE  09/29/2017  . MAMMOGRAM  04/16/2019  . TETANUS/TDAP  04/01/2025  . COLONOSCOPY  12/28/2026  . DEXA SCAN  Completed    Cancer Screenings: Lung: Low Dose CT Chest recommended if Age 71-80 years, 30 pack-year currently smoking OR have quit w/in 15years. Patient does not qualify. Breast:  Up to date on Mammogram? Yes   Up to date of Bone Density/Dexa? Yes Colorectal:  12/27/2016  Additional Screenings: Hepatitis C Screening: n/a     Plan:     -Did discuss with patient need to reach AHA guidelines for exercise.   - Also discussed benefits of exercise in regards to preventing memory issues as she ages, which patient had some questions and concerns about today-on how she can prevent onset of Alzheimer's or memory issues as she ages.  We discussed high antioxidant diet as well as learning a new language and or a new instrument to create new neuronal pathways which is very good for brain health. -Patient will call her insurance to discuss Shingrix vaccine and find out which pharmacy she needs to go to to update this.  I have personally reviewed and noted the following in the patient's chart:   . Medical and social history . Use of alcohol, tobacco or illicit drugs  . Current medications and supplements . Functional ability and status . Nutritional status . Physical activity . Advanced directives . List of other physicians . Hospitalizations, surgeries, and ER visits in previous 12 months . Vitals . Screenings to include cognitive, depression, and falls . Referrals and appointments  In addition, I have reviewed and discussed with patient certain preventive protocols, quality metrics, and best practice recommendations.  Written handout on health goals was declined by patient today.    Mellody Dance, DO  08/29/2017

## 2017-08-29 NOTE — Progress Notes (Signed)
Entered in error by medical transcriptionist.

## 2017-08-29 NOTE — Patient Instructions (Signed)
Patient declined AVS 

## 2017-09-07 ENCOUNTER — Encounter: Payer: Self-pay | Admitting: Family Medicine

## 2017-09-16 ENCOUNTER — Ambulatory Visit (INDEPENDENT_AMBULATORY_CARE_PROVIDER_SITE_OTHER): Payer: Medicare Other | Admitting: Family Medicine

## 2017-09-16 ENCOUNTER — Encounter: Payer: Self-pay | Admitting: Family Medicine

## 2017-09-16 VITALS — BP 104/69 | HR 73 | Temp 97.7°F | Ht 64.0 in | Wt 163.5 lb

## 2017-09-16 DIAGNOSIS — R05 Cough: Secondary | ICD-10-CM

## 2017-09-16 DIAGNOSIS — J069 Acute upper respiratory infection, unspecified: Secondary | ICD-10-CM | POA: Diagnosis not present

## 2017-09-16 DIAGNOSIS — R059 Cough, unspecified: Secondary | ICD-10-CM

## 2017-09-16 DIAGNOSIS — H6691 Otitis media, unspecified, right ear: Secondary | ICD-10-CM | POA: Diagnosis not present

## 2017-09-16 MED ORDER — AMOXICILLIN-POT CLAVULANATE 875-125 MG PO TABS: 1.0000 | ORAL_TABLET | Freq: Two times a day (BID) | ORAL | 0 refills | Status: AC

## 2017-09-16 MED ORDER — METHYLPREDNISOLONE ACETATE 80 MG/ML IJ SUSP
80.0000 mg | Freq: Once | INTRAMUSCULAR | Status: AC
Start: 2017-09-16 — End: 2017-09-16
  Administered 2017-09-16: 80 mg via INTRAMUSCULAR

## 2017-09-16 MED ORDER — HYDROCOD POLST-CPM POLST ER 10-8 MG/5ML PO SUER: 5.0000 mL | Freq: Two times a day (BID) | ORAL | 0 refills | Status: DC | PRN

## 2017-09-16 NOTE — Patient Instructions (Signed)
Upper Respiratory Infection, Adult Most upper respiratory infections (URIs) are a viral infection of the air passages leading to the lungs. A URI affects the nose, throat, and upper air passages. The most common type of URI is nasopharyngitis and is typically referred to as "the common cold." URIs run their course and usually go away on their own. Most of the time, a URI does not require medical attention, but sometimes a bacterial infection in the upper airways can follow a viral infection. This is called a secondary infection. Sinus and middle ear infections are common types of secondary upper respiratory infections. Bacterial pneumonia can also complicate a URI. A URI can worsen asthma and chronic obstructive pulmonary disease (COPD). Sometimes, these complications can require emergency medical care and may be life threatening. What are the causes? Almost all URIs are caused by viruses. A virus is a type of germ and can spread from one person to another. What increases the risk? You may be at risk for a URI if:  You smoke.  You have chronic heart or lung disease.  You have a weakened defense (immune) system.  You are very young or very old.  You have nasal allergies or asthma.  You work in crowded or poorly ventilated areas.  You work in health care facilities or schools.  What are the signs or symptoms? Symptoms typically develop 2-3 days after you come in contact with a cold virus. Most viral URIs last 7-10 days. However, viral URIs from the influenza virus (flu virus) can last 14-18 days and are typically more severe. Symptoms may include:  Runny or stuffy (congested) nose.  Sneezing.  Cough.  Sore throat.  Headache.  Fatigue.  Fever.  Loss of appetite.  Pain in your forehead, behind your eyes, and over your cheekbones (sinus pain).  Muscle aches.  How is this diagnosed? Your health care provider may diagnose a URI by:  Physical exam.  Tests to check that your  symptoms are not due to another condition such as: ? Strep throat. ? Sinusitis. ? Pneumonia. ? Asthma.  How is this treated? A URI goes away on its own with time. It cannot be cured with medicines, but medicines may be prescribed or recommended to relieve symptoms. Medicines may help:  Reduce your fever.  Reduce your cough.  Relieve nasal congestion.  Follow these instructions at home:  Take medicines only as directed by your health care provider.  Gargle warm saltwater or take cough drops to comfort your throat as directed by your health care provider.  Use a warm mist humidifier or inhale steam from a shower to increase air moisture. This may make it easier to breathe.  Drink enough fluid to keep your urine clear or pale yellow.  Eat soups and other clear broths and maintain good nutrition.  Rest as needed.  Return to work when your temperature has returned to normal or as your health care provider advises. You may need to stay home longer to avoid infecting others. You can also use a face mask and careful hand washing to prevent spread of the virus.  Increase the usage of your inhaler if you have asthma.  Do not use any tobacco products, including cigarettes, chewing tobacco, or electronic cigarettes. If you need help quitting, ask your health care provider. How is this prevented? The best way to protect yourself from getting a cold is to practice good hygiene.  Avoid oral or hand contact with people with cold symptoms.  Wash your   hands often if contact occurs.  There is no clear evidence that vitamin C, vitamin E, echinacea, or exercise reduces the chance of developing a cold. However, it is always recommended to get plenty of rest, exercise, and practice good nutrition. Contact a health care provider if:  You are getting worse rather than better.  Your symptoms are not controlled by medicine.  You have chills.  You have worsening shortness of breath.  You have  brown or red mucus.  You have yellow or brown nasal discharge.  You have pain in your face, especially when you bend forward.  You have a fever.  You have swollen neck glands.  You have pain while swallowing.  You have white areas in the back of your throat. Get help right away if:  You have severe or persistent: ? Headache. ? Ear pain. ? Sinus pain. ? Chest pain.  You have chronic lung disease and any of the following: ? Wheezing. ? Prolonged cough. ? Coughing up blood. ? A change in your usual mucus.  You have a stiff neck.  You have changes in your: ? Vision. ? Hearing. ? Thinking. ? Mood. This information is not intended to replace advice given to you by your health care provider. Make sure you discuss any questions you have with your health care provider. Document Released: 08/11/2000 Document Revised: 10/19/2015 Document Reviewed: 05/23/2013 Elsevier Interactive Patient Education  2018 Elsevier Inc.  

## 2017-09-16 NOTE — Progress Notes (Signed)
Acute Care Office visit  Assessment and plan:  1. Right otitis media, unspecified otitis media type   2. Cough-productive   3. Upper respiratory tract infection, unspecified type     1. Cough & Cold Symptoms - Viral vs Allergic vs Bacterial causes for pt's symptoms reveiwed.    - Supportive care and various OTC medications discussed in addition to any prescribed.  - Push fluids, warm fluids, obtain adequate rest.   - Prescription provided for cough medicine today.  - Steroid injection prescribed and administered today.  - Since sx going on for a week with no improvement, antibiotics prescribed today. - Per pt, she is very GI-sensitive to antibiotics.  Prescription provided to patient, who knows to start taking prescription only if her symptoms worsen or do not improve in next several days.  - If using antibiotics, advised implementation of Miralax or magnesium citrate as directed PRN to help alleviate antibiotic-related GI concerns such as constipation.    - Advised the patient to begin using AYR or Neilmed sinus rinses BID followed by flonase BID (one spray to each nostril). Advised that the patient may also incorporate allegra or claritin PRN.   2. Follow-Up - Call or RTC if new symptoms, or if no improvement or worse over next several days.      Meds ordered this encounter  Medications  . amoxicillin-clavulanate (AUGMENTIN) 875-125 MG tablet    Sig: Take 1 tablet by mouth 2 (two) times daily for 10 days.    Dispense:  20 tablet    Refill:  0  . chlorpheniramine-HYDROcodone (TUSSIONEX) 10-8 MG/5ML SUER    Sig: Take 5 mLs by mouth every 12 (twelve) hours as needed for cough (cough, will cause drowsiness.).    Dispense:  200 mL    Refill:  0  . methylPREDNISolone acetate (DEPO-MEDROL) injection 80 mg    No orders of the defined types were placed in this encounter.   Gross side effects, risk and benefits, and alternatives of medications discussed with patient.   Patient is aware that all medications have potential side effects and we are unable to predict every sideeffect or drug-drug interaction that may occur.  Expresses verbal understanding and consents to current therapy plan and treatment regiment.   Education and routine counseling performed. Handouts provided.  Anticipatory guidance and routine counseling done re: condition, txmnt options and need for follow up. All questions of patient's were answered.  Return if symptoms worsen or fail to improve, for Follow-up chronic care as previously discussed.  Please see AVS handed out to patient at the end of our visit for additional patient instructions/ counseling done pertaining to today's office visit.  Note: This document was partially repared using Dragon voice recognition software and may include unintentional dictation errors.  This document serves as a record of services personally performed by Mellody Dance, DO. It was created on her behalf by Toni Amend, a trained medical scribe. The creation of this record is based on the scribe's personal observations and the provider's statements to them.   I have reviewed the above medical documentation for accuracy and completeness and I concur.  Mellody Dance 09/25/17 9:40 PM    Subjective:    Chief Complaint  Patient presents with  . Cough    HPI:  Pt presents with Sx for about 7 days.  States she's pretty sure who gave her the cold; it was an old friend at the [employer] who was hugging her and told her  she'd been sick.   C/o:  Mainly a cough, "feeling crappy last week."  Feels a bit of right-sided face pain.  States a "very" productive cough.  "It's wet; I cough up discolored, sometimes a little bloody looking stuff."  Coughs mostly at night and early morning.  Notes "it's been more in the chest" and that she had something similar to this late November/early December last year.    Denies:  Feeling particularly  wheezy or SOB.  States "not really" when asked if she feels wheezy or SOB.  Overall getting:   Feels much better now, but it's still definitely in her chest.  Notes she felt much worse last week.   Patient Care Team    Relationship Specialty Notifications Start End  Mellody Dance, DO PCP - General Family Medicine  08/27/16   Adrian Prows, [place] Physician Cardiology  08/27/16   Evans Lance, MD Consulting Physician Cardiology  08/27/16   Juanita Craver, MD Consulting Physician Gastroenterology  08/27/16   Jacelyn Pi, MD Consulting Physician Endocrinology  08/27/16   Macarthur Critchley, [place] Referring Physician Optometry  08/27/16   Darleen Crocker, MD Consulting Physician Ophthalmology  08/27/16   Linton Rump, PT Physical Therapist Physical Therapy  08/27/16   Megan Salon, MD Consulting Physician Gynecology  08/27/16     Past medical history, Surgical history, Family history reviewed and noted below, Social history, Allergies, and Medications have been entered into the medical record, reviewed and changed as needed.   No Known Allergies  Review of Systems: - see above HPI for pertinent positives General:   No F/C, wt loss Pulm:   No DIB, pleuritic chest pain Card:  No CP, palpitations Abd:  No n/v/d or pain Ext:  No inc edema from baseline   Objective:   Blood pressure 104/69, pulse 73, temperature 97.7 F (36.5 C), temperature source Oral, height 5\' 4"  (1.626 m), weight 163 lb 8 oz (74.2 kg), SpO2 98 %. Body mass index is 28.06 kg/m. General: Well Developed, well nourished, appropriate for stated age.  Neuro: Alert and oriented x3, extra-ocular muscles intact, sensation grossly intact.  HEENT: Normocephalic, atraumatic, pupils equal round reactive to light, neck supple, no masses, no painful lymphadenopathy, TM's intact B/L. Acute findings: right TM opaque, loss of light reflex, and retracted.  Nares- patent, clear d/c, OP- clear, mild erythema, No TTP sinuses Skin: Warm and  dry, no gross rash. Cardiac: RRR, S1 S2,  no murmurs rubs or gallops.  Respiratory: ECTA B/L and A/P, Not using accessory muscles, speaking in full sentences- unlabored. Vascular:  No gross lower ext edema, cap RF less 2 sec. Psych: No HI/SI, judgement and insight good, Euthymic mood. Full Affect.

## 2017-10-06 ENCOUNTER — Ambulatory Visit: Payer: Medicare Other | Admitting: Family Medicine

## 2017-11-07 DIAGNOSIS — R5383 Other fatigue: Secondary | ICD-10-CM | POA: Diagnosis not present

## 2017-11-07 DIAGNOSIS — Z8679 Personal history of other diseases of the circulatory system: Secondary | ICD-10-CM | POA: Diagnosis not present

## 2017-11-07 DIAGNOSIS — R5381 Other malaise: Secondary | ICD-10-CM | POA: Diagnosis not present

## 2017-11-07 DIAGNOSIS — Z9889 Other specified postprocedural states: Secondary | ICD-10-CM | POA: Diagnosis not present

## 2017-11-24 ENCOUNTER — Other Ambulatory Visit: Payer: Self-pay | Admitting: Endocrinology

## 2017-11-24 DIAGNOSIS — E049 Nontoxic goiter, unspecified: Secondary | ICD-10-CM

## 2017-12-01 ENCOUNTER — Ambulatory Visit
Admission: RE | Admit: 2017-12-01 | Discharge: 2017-12-01 | Disposition: A | Discharge status: Home / Self Care | Payer: Medicare Other | Source: Ambulatory Visit | Attending: Endocrinology | Admitting: Endocrinology

## 2017-12-01 DIAGNOSIS — E049 Nontoxic goiter, unspecified: Secondary | ICD-10-CM | POA: Diagnosis not present

## 2017-12-02 DIAGNOSIS — E049 Nontoxic goiter, unspecified: Secondary | ICD-10-CM | POA: Diagnosis not present

## 2017-12-09 DIAGNOSIS — E049 Nontoxic goiter, unspecified: Secondary | ICD-10-CM | POA: Diagnosis not present

## 2017-12-09 DIAGNOSIS — Z23 Encounter for immunization: Secondary | ICD-10-CM | POA: Diagnosis not present

## 2018-03-20 ENCOUNTER — Other Ambulatory Visit: Payer: Self-pay | Admitting: Obstetrics & Gynecology

## 2018-03-20 DIAGNOSIS — Z1231 Encounter for screening mammogram for malignant neoplasm of breast: Secondary | ICD-10-CM

## 2018-03-21 ENCOUNTER — Other Ambulatory Visit: Payer: Self-pay | Admitting: Cardiology

## 2018-03-21 DIAGNOSIS — R5381 Other malaise: Secondary | ICD-10-CM

## 2018-03-21 DIAGNOSIS — R5383 Other fatigue: Principal | ICD-10-CM

## 2018-03-31 DIAGNOSIS — R011 Cardiac murmur, unspecified: Secondary | ICD-10-CM | POA: Diagnosis not present

## 2018-04-04 ENCOUNTER — Telehealth: Payer: Self-pay

## 2018-04-11 ENCOUNTER — Telehealth: Payer: Self-pay

## 2018-04-11 NOTE — Telephone Encounter (Signed)
Patient called after hours service requesting to reschedule an appt. I left message on cell voice mail for her to call back.

## 2018-04-17 ENCOUNTER — Ambulatory Visit
Admission: RE | Admit: 2018-04-17 | Discharge: 2018-04-17 | Disposition: A | Discharge status: Home / Self Care | Payer: Medicare Other | Source: Ambulatory Visit | Attending: Obstetrics & Gynecology | Admitting: Obstetrics & Gynecology

## 2018-04-17 DIAGNOSIS — Z1231 Encounter for screening mammogram for malignant neoplasm of breast: Secondary | ICD-10-CM | POA: Diagnosis not present

## 2018-04-18 ENCOUNTER — Telehealth: Payer: Self-pay

## 2018-04-18 NOTE — Telephone Encounter (Signed)
Patient will call the office to sch a stress test and a fu appt with Dr Einar Gip. Patient needs to check her calendar first.

## 2018-04-18 NOTE — Telephone Encounter (Signed)
VM left for patient to call back and r/s 2/21 stress test. 3rd attempt.

## 2018-04-28 DIAGNOSIS — H5213 Myopia, bilateral: Secondary | ICD-10-CM | POA: Diagnosis not present

## 2018-05-04 NOTE — Telephone Encounter (Signed)
GXT scheduled for 05/22/2018 @ 2:30pm Follow-up scheduled for 06/02/2018 @ 11:15am

## 2018-05-05 ENCOUNTER — Ambulatory Visit: Payer: Self-pay | Admitting: Cardiology

## 2018-06-02 ENCOUNTER — Ambulatory Visit: Payer: Self-pay | Admitting: Cardiology

## 2018-11-14 ENCOUNTER — Telehealth: Payer: Self-pay | Admitting: Family Medicine

## 2018-11-14 ENCOUNTER — Other Ambulatory Visit: Payer: Self-pay

## 2018-11-14 DIAGNOSIS — E049 Nontoxic goiter, unspecified: Secondary | ICD-10-CM

## 2018-11-14 DIAGNOSIS — Z0000 Encounter for general adult medical examination without abnormal findings: Secondary | ICD-10-CM

## 2018-11-14 NOTE — Telephone Encounter (Signed)
Patient  Requested & was originally scheduled for a CPE W/ FWB on 11/20/18 ( it was changed to a Medicare Wellness Exam per Dr. Raliegh Scarlet ) pt still wants labs drawn because she hasn't had any in the last 2 years.  ---- Forwarding message to medical assistant to review w/provider for Lab orders .   --Please call patient if there are any questions or concerns @ [phone].  -glh

## 2018-11-14 NOTE — Telephone Encounter (Signed)
Orders placed for labs.  Charyl Bigger, CMA

## 2018-11-20 ENCOUNTER — Other Ambulatory Visit: Payer: Medicare Other

## 2018-11-20 ENCOUNTER — Other Ambulatory Visit: Payer: Self-pay

## 2018-11-20 ENCOUNTER — Ambulatory Visit (INDEPENDENT_AMBULATORY_CARE_PROVIDER_SITE_OTHER): Payer: Medicare Other | Admitting: Family Medicine

## 2018-11-20 ENCOUNTER — Encounter: Payer: Self-pay | Admitting: Family Medicine

## 2018-11-20 VITALS — Ht 63.5 in | Wt 140.0 lb

## 2018-11-20 DIAGNOSIS — Z23 Encounter for immunization: Secondary | ICD-10-CM

## 2018-11-20 DIAGNOSIS — Z13820 Encounter for screening for osteoporosis: Secondary | ICD-10-CM | POA: Diagnosis not present

## 2018-11-20 DIAGNOSIS — Z0000 Encounter for general adult medical examination without abnormal findings: Secondary | ICD-10-CM

## 2018-11-20 DIAGNOSIS — E049 Nontoxic goiter, unspecified: Secondary | ICD-10-CM

## 2018-11-20 MED ORDER — PNEUMOCOCCAL 13-VAL CONJ VACC IM SUSP: 0.5000 mL | INTRAMUSCULAR | 0 refills | Status: AC

## 2018-11-20 MED ORDER — ZOSTER VAC RECOMB ADJUVANTED 50 MCG/0.5ML IM SUSR: 0.5000 mL | Freq: Once | INTRAMUSCULAR | 0 refills | Status: AC

## 2018-11-20 NOTE — Progress Notes (Signed)
Subjective:   Lindsay [name] is a 73 y.o. female who presents for Medicare Annual (Subsequent) preventive examination.  Pt can't afford shingrix vaccine.  Declines getting it.   Can't afford others per pt.  Would like to forego it    Objective:   Any vitals that were not documented were unable to be obtained due to patient not being physically in the clinic at the time of this eval  Vitals: Ht 5' 3.5" (1.613 m)   Wt 140 lb (63.5 kg)   BMI 24.41 kg/m   Body mass index is 24.41 kg/m.   Advanced Directives 12/17/2016 08/27/2016 08/05/2011 08/05/2011  Does Patient Have a Medical Advance Directive? No No Patient does not have advance directive Patient does not have advance directive  Would patient like information on creating a medical advance directive? - No - Patient declined - -  Pre-existing out of facility DNR order (yellow form or pink MOST form) - - No -    Tobacco Social History   Tobacco Use  Smoking Status Former Smoker  . Types: Cigarettes  . Quit date: 03/01/1970  . Years since quitting: 48.7  Smokeless Tobacco Never Used     Counseling given: Not Answered   Past Medical History:  Diagnosis Date  . Arthritis   . Chest pain   . Heart murmur   . Herpes   . History of hepatitis    and mononucleosis  . Hypothyroidism   . Mononucleosis 1967  . Multinodular goiter   . Osteopenia   . Palpitations   . S/P AV nodal ablation 08/05/2011  . SVT (supraventricular tachycardia) ([place])   . Thyroid nodule    Past Surgical History:  Procedure Laterality Date  . BREAST CYST ASPIRATION Bilateral 12/15/2000  . CATARACT EXTRACTION W/ INTRAOCULAR LENS IMPLANT Bilateral 2014  . paragard iud     8/90  . SUPRAVENTRICULAR TACHYCARDIA ABLATION N/A 08/05/2011   Procedure: SUPRAVENTRICULAR TACHYCARDIA ABLATION;  Surgeon: Evans Lance, MD;  [facility];  Service: Cardiovascular;  Laterality: N/A;  . TONSILLECTOMY     Family History  Problem Relation Age of Onset  .  Dementia Mother 67       alive  . Transient ischemic attack Mother        hx of; has a pacemaker  . Hypothyroidism Mother   . Other Father 60       old age. Develop CHF the last 4 months  . Other Sister 25       died of thymoma  . Hypothyroidism Sister   . Other Maternal Aunt        nasal tumor  . Heart attack Maternal Grandfather   . Hypothyroidism Maternal Aunt   . Stroke Paternal Grandfather   . Colon cancer Neg Hx   . Esophageal cancer Neg Hx   . Pancreatic cancer Neg Hx   . Prostate cancer Neg Hx   . Rectal cancer Neg Hx   . Stomach cancer Neg Hx    Social History   Socioeconomic History  . Marital status: Divorced    Spouse name: Not on file  . Number of children: 2  . Years of education: Not on file  . Highest education level: Not on file  Occupational History  . Not on file  Social Needs  . Financial resource strain: Not on file  . Food insecurity    Worry: Not on file    Inability: Not on file  . Transportation needs  Medical: Not on file    Non-medical: Not on file  Tobacco Use  . Smoking status: Former Smoker    Types: Cigarettes    Quit date: 03/01/1970    Years since quitting: 48.7  . Smokeless tobacco: Never Used  Substance and Sexual Activity  . Alcohol use: Yes    Alcohol/week: 0.0 standard drinks    Comment: occasional  . Drug use: No  . Sexual activity: Never    Birth control/protection: Post-menopausal  Lifestyle  . Physical activity    Days per week: Not on file    Minutes per session: Not on file  . Stress: Not on file  Relationships  . Social Herbalist on phone: Not on file    Gets together: Not on file    Attends religious service: Not on file    Active member of club or organization: Not on file    Attends meetings of clubs or organizations: Not on file    Relationship status: Not on file  Other Topics Concern  . Not on file  Social History Narrative  . Not on file    Outpatient Encounter Medications as of  11/20/2018  Medication Sig  . aspirin EC 81 MG tablet Take 81 mg by mouth daily.  . cholecalciferol (VITAMIN D) 1000 units tablet Take 2,000 Units by mouth daily.  . Flaxseed, Linseed, (FLAX SEEDS PO) Take 30 mLs by mouth daily.  . folic acid (FOLVITE) Q000111Q MCG tablet Take 400 mcg by mouth daily.  Marland Kitchen GINSENG PO Take by mouth daily.  Marland Kitchen levothyroxine (SYNTHROID, LEVOTHROID) 25 MCG tablet Take 1 tablet by mouth daily.  . Probiotic Product (PROBIOTIC FORMULA PO) Take 1 capsule by mouth daily. New chapter All-Flora.  . TURMERIC PO Take by mouth daily.  . [EXPIRED] pneumococcal 13-valent conjugate vaccine (PREVNAR 13) SUSP injection Inject 0.5 mLs into the muscle tomorrow at 10 am for 1 dose.  . [EXPIRED] Zoster Vaccine Adjuvanted [facility]) injection Inject 0.5 mLs into the muscle once for 1 dose.  . [DISCONTINUED] chlorpheniramine-HYDROcodone (TUSSIONEX) 10-8 MG/5ML SUER Take 5 mLs by mouth every 12 (twelve) hours as needed for cough (cough, will cause drowsiness.). (Patient not taking: Reported on 11/20/2018)  . [DISCONTINUED] ranitidine (ZANTAC) 300 MG tablet One half to 1 tablet twice daily for reflux (Patient not taking: Reported on 11/20/2018)   Facility-Administered Encounter Medications as of 11/20/2018  Medication  . bupivacaine (MARCAINE) 0.5 % injection 2 mL  . lidocaine (XYLOCAINE) 2 % (with pres) injection 20 mg  . methylPREDNISolone acetate (DEPO-MEDROL) injection 40 mg    Activities of Daily Living In your present state of health, do you have any difficulty performing the following activities: 11/20/2018  Hearing? N  Vision? N  Difficulty concentrating or making decisions? N  Walking or climbing stairs? N  Dressing or bathing? N  Doing errands, shopping? N  Some recent data might be hidden    Patient Care Team: Mellody Dance, DO as PCP - General (Family Medicine) Adrian Prows, MD as Consulting Physician (Cardiology) Evans Lance, MD as Consulting Physician  (Cardiology) Juanita Craver, MD as Consulting Physician (Gastroenterology) Jacelyn Pi, MD as Consulting Physician (Endocrinology) Macarthur Critchley, [place] as Referring Physician (Optometry) Darleen Crocker, MD as Consulting Physician (Ophthalmology) Linton Rump, PT as Physical Therapist (Physical Therapy) Megan Salon, MD as Consulting Physician (Gynecology)    Assessment:   This is a routine wellness examination for [place].  Exercise Activities and Dietary recommendations    Goals  None     Fall Risk Fall Risk  11/20/2018 08/29/2017 06/20/2017 08/27/2016  Falls in the past year? 1 No No No  Number falls in past yr: 0 - - -  Injury with Fall? 0 - - -   Is the patient's home free of loose throw rugs in walkways, pet beds, electrical cords, etc?   no      Grab bars in the bathroom? no      Handrails on the stairs?   yes      Adequate lighting?   yes  Timed Get Up and Go performed: over the phone  Depression Screen PHQ 2/9 Scores 11/20/2018 09/16/2017 08/29/2017 08/15/2017  PHQ - 2 Score 0 0 0 0  PHQ- 9 Score 0 0 0 3     Cognitive Function     6CIT Screen 11/20/2018 08/29/2017  What Year? 0 points 0 points  What month? 0 points 0 points  What time? 0 points 0 points  Count back from 20 0 points 0 points  Months in reverse 0 points 0 points  Repeat phrase 0 points 0 points  Total Score 0 0    Immunization History  Administered Date(s) Administered  . DTaP 03/02/2015  . Pneumococcal Polysaccharide-23 06/22/2013  . Tdap 06/28/2007    Qualifies for Shingles Vaccine? yes, declines  Screening Tests Health Maintenance  Topic Date Due  . PNA vac Low Risk Adult (2 of 2 - PCV13) 06/23/2014  . INFLUENZA VACCINE  09/30/2018  . Hepatitis C Screening  08/26/2028 (Originally [birth year])  . MAMMOGRAM  04/17/2020  . TETANUS/TDAP  04/01/2025  . COLONOSCOPY  12/28/2026  . DEXA SCAN  Completed    Cancer Screenings: Lung: Low Dose CT Chest recommended if Age 71-80 years, 30 pack-year  currently smoking OR have quit w/in 15years. Patient does not qualify. Breast:  Up to date on Mammogram? Yes   Up to date of Bone Density/Dexa? Yes Colorectal: UTD  Additional Screenings: : Hepatitis C Screening: unsure     Plan:   -Any immunizations or screening tests that were clinically not done, was due to patient declining those services today, otherwise all preventive issues and health maintenance was updated and addressed today.  I have personally reviewed and noted the following in the patient's chart:   . Medical and social history . Use of alcohol, tobacco or illicit drugs  . Current medications and supplements . Functional ability and status . Nutritional status . Physical activity . Advanced directives . List of other physicians . Hospitalizations, surgeries, and ER visits in previous 12 months . Vitals . Screenings to include cognitive, depression, and falls . Referrals and appointments     Orders Placed This Encounter  Procedures  . DG Bone Density    Mellody Dance, DO  11/21/2018

## 2018-11-21 LAB — CBC WITH DIFFERENTIAL/PLATELET
Basophils Absolute: 0 10*3/uL (ref 0.0–0.2)
Basos: 1 %
EOS (ABSOLUTE): 0.2 10*3/uL (ref 0.0–0.4)
Eos: 4 %
Hematocrit: 37.3 % (ref 34.0–46.6)
Hemoglobin: 12.6 g/dL (ref 11.1–15.9)
Immature Grans (Abs): 0 10*3/uL (ref 0.0–0.1)
Immature Granulocytes: 0 %
Lymphocytes Absolute: 1.8 10*3/uL (ref 0.7–3.1)
Lymphs: 33 %
MCH: 31.7 pg (ref 26.6–33.0)
MCHC: 33.8 g/dL (ref 31.5–35.7)
MCV: 94 fL (ref 79–97)
Monocytes Absolute: 0.7 10*3/uL (ref 0.1–0.9)
Monocytes: 12 %
Neutrophils Absolute: 2.7 10*3/uL (ref 1.4–7.0)
Neutrophils: 50 %
Platelets: 256 10*3/uL (ref 150–450)
RBC: 3.98 x10E6/uL (ref 3.77–5.28)
RDW: 12.2 % (ref 11.7–15.4)
WBC: 5.5 10*3/uL (ref 3.4–10.8)

## 2018-11-21 LAB — LIPID PANEL
Chol/HDL Ratio: 2.6 ratio (ref 0.0–4.4)
Cholesterol, Total: 199 mg/dL (ref 100–199)
HDL: 76 mg/dL (ref >39)
LDL Chol Calc (NIH): 113 mg/dL — ABNORMAL HIGH (ref 0–99)
Triglycerides: 52 mg/dL (ref 0–149)
VLDL Cholesterol Cal: 10 mg/dL (ref 5–40)

## 2018-11-21 LAB — COMPREHENSIVE METABOLIC PANEL
ALT: 22 IU/L (ref 0–32)
AST: 19 IU/L (ref 0–40)
Albumin/Globulin Ratio: 1.6 (ref 1.2–2.2)
Albumin: 3.9 g/dL (ref 3.7–4.7)
Alkaline Phosphatase: 81 IU/L (ref 39–117)
BUN/Creatinine Ratio: 14 (ref 12–28)
BUN: 12 mg/dL (ref 8–27)
Bilirubin Total: 0.6 mg/dL (ref 0.0–1.2)
CO2: 25 mmol/L (ref 20–29)
Calcium: 9 mg/dL (ref 8.7–10.3)
Chloride: 104 mmol/L (ref 96–106)
Creatinine, Ser: 0.86 mg/dL (ref 0.57–1.00)
GFR calc Af Amer: 78 mL/min/{1.73_m2} (ref >59)
GFR calc non Af Amer: 68 mL/min/{1.73_m2} (ref >59)
Globulin, Total: 2.4 g/dL (ref 1.5–4.5)
Glucose: 86 mg/dL (ref 65–99)
Potassium: 3.9 mmol/L (ref 3.5–5.2)
Sodium: 139 mmol/L (ref 134–144)
Total Protein: 6.3 g/dL (ref 6.0–8.5)

## 2018-11-21 LAB — VITAMIN D 25 HYDROXY (VIT D DEFICIENCY, FRACTURES): Vit D, 25-Hydroxy: 37.2 ng/mL (ref 30.0–100.0)

## 2018-11-21 LAB — T3: T3, Total: 116 ng/dL (ref 71–180)

## 2018-11-21 LAB — TSH

## 2018-11-21 LAB — T4, FREE: Free T4: 1.12 ng/dL (ref 0.82–1.77)

## 2018-11-21 LAB — HEMOGLOBIN A1C

## 2018-11-30 ENCOUNTER — Encounter: Payer: Self-pay | Admitting: Family Medicine

## 2018-11-30 ENCOUNTER — Other Ambulatory Visit: Payer: Self-pay

## 2018-11-30 ENCOUNTER — Ambulatory Visit (INDEPENDENT_AMBULATORY_CARE_PROVIDER_SITE_OTHER): Payer: Medicare Other | Admitting: Family Medicine

## 2018-11-30 VITALS — BP 144/74 | HR 72 | Ht 63.5 in | Wt 147.0 lb

## 2018-11-30 DIAGNOSIS — R7989 Other specified abnormal findings of blood chemistry: Secondary | ICD-10-CM | POA: Insufficient documentation

## 2018-11-30 DIAGNOSIS — E559 Vitamin D deficiency, unspecified: Secondary | ICD-10-CM | POA: Insufficient documentation

## 2018-11-30 DIAGNOSIS — Z789 Other specified health status: Secondary | ICD-10-CM

## 2018-11-30 DIAGNOSIS — I471 Supraventricular tachycardia: Secondary | ICD-10-CM | POA: Diagnosis not present

## 2018-11-30 DIAGNOSIS — M159 Polyosteoarthritis, unspecified: Secondary | ICD-10-CM | POA: Diagnosis not present

## 2018-11-30 DIAGNOSIS — E049 Nontoxic goiter, unspecified: Secondary | ICD-10-CM

## 2018-11-30 MED ORDER — VITAMIN D (ERGOCALCIFEROL) 1.25 MG (50000 UNIT) PO CAPS: ORAL_CAPSULE | ORAL | 3 refills | Status: DC

## 2018-11-30 NOTE — Progress Notes (Signed)
Assessment and plan:  1. h/o Paroxysmal SVT (supraventricular tachycardia) ([name])   2. High serum low-density lipoprotein (LDL)   3. High serum high density lipoprotein (HDL)   4. Generalized osteoarthritis of multiple sites   5. Thyroid goiter   6. Vitamin D insufficiency   7. Vegetarian diet     Cardiac Care - h/o Paroxysmal SVT - Discussed that patient should call cardiology -Dr. Einar Gip or Lovena Le - and ask if they feel she still needs a stress echocardiogram - Per patient, recently obtained cardiac ultrasound and stress test was ordered, but not completed. -Advised also to discuss with them her increased ASCVD risk. - Will continue to monitor   Lab Review - Reviewed recent lab work (11/20/2018) in depth with patient today.  All lab work within normal limits unless otherwise noted.  Extensive education provided and all questions answered.   Thyroid Goiter - Stable at this time. - Thyroid labs stable last check. - Patient will tell endocrinologist -Dr. Soyla Murphy - about recent labs.  - Will continue to monitor.   Vitamin D Insufficiency - 37.2 last check. - Reviewed goal of 40-60 range. - Advised patient to begin prescription Vitamin D as prescribed.  See med list. - In addition, advised patient to begin calcium supplementation as recommended.  - Will continue to monitor. - Re-check come March at end of winter.    High Serum Low-Density Lipoprotein (LDL); High HDL LDL = 113, elevated. HDL = 76, at goal. Triglycerides = 52.  The 10-year ASCVD risk score [name] DC [name]., et al., 2013) is: 14%   Values used to calculate the score:     Age: 51 years     Sex: Female     Is Non-Hispanic African American: No     Diabetic: No     Tobacco smoker: No     Systolic Blood Pressure: 123456 mmHg     Is BP treated: No     HDL Cholesterol: 76 mg/dL     Total Cholesterol: 199 mg/dL  - Patient has never been managed on  cholesterol medications in the past. - Advised patient of importance of reducing ASCVD risk below 7%.  -Patient declines statin or cholesterol medications and I advised her to speak with her cardiologist about this due to her increased ASCVD risk.    - Advised natural remedies - red yeast rice OTC.  - Dietary changes such as low saturated & trans fat and low carb diets discussed with patient.  Encouraged regular exercise and weight loss when appropriate.   - Encouraged patient to continue following vegan diet. - Advised patient to continue exercising and keeping busy to keep HDL value high.  - Educational handouts provided at patient's desire.   BMI Counseling - Body mass index is 25.63 kg/m Explained to patient what BMI refers to, and what it means medically.    Told patient to think about it as a "medical risk stratification measurement" and how increasing BMI is associated with increasing risk/ or worsening state of various diseases such as hypertension, hyperlipidemia, diabetes, premature OA, depression etc.  American Heart Association guidelines for healthy diet, basically Mediterranean diet, and exercise guidelines of 30 minutes 5 days per week or more discussed in detail.  Health counseling performed.  All questions answered.   Lifestyle & Preventative Health Maintenance - Advised patient to continue working toward exercising to improve overall mental, physical, and emotional health.    - Encouraged patient to engage in  weight training to help tone her body and aid with physical conditioning.  - Reviewed the "spokes of the wheel" of mood and health management.  Stressed the importance of ongoing prudent habits, including regular exercise, appropriate sleep hygiene, healthful dietary habits, and prayer/meditation to relax.  - Encouraged patient to engage in daily physical activity, especially a formal exercise routine.  Recommended that the patient eventually strive for at least  150 minutes of moderate cardiovascular activity per week according to guidelines established by the [facility].   - Healthy dietary habits encouraged, including low-carb, and high amounts of lean protein in diet.   - Patient should also consume adequate amounts of water.   Education and routine counseling performed. Handouts provided.    Meds ordered this encounter  Medications  . Vitamin D, Ergocalciferol, (DRISDOL) 1.25 MG (50000 UT) CAPS capsule    Sig: Take one tablet wkly    Dispense:  12 capsule    Refill:  3     Return for Re-check Vitamin D in March, o/w keep f/up.   Anticipatory guidance and routine counseling done re: condition, txmnt options and need for follow up. All questions of patient's were answered.   Gross side effects, risk and benefits, and alternatives of medications discussed with patient.  Patient is aware that all medications have potential side effects and we are unable to predict every sideeffect or drug-drug interaction that may occur.  Expresses verbal understanding and consents to current therapy plan and treatment regiment.  Please see AVS handed out to patient at the end of our visit for additional patient instructions/ counseling done pertaining to today's office visit.  Note:  This document was prepared using Dragon voice recognition software and may include unintentional dictation errors.  This document serves as a record of services personally performed by Mellody Dance, DO. It was created on her behalf by Toni Amend, a trained medical scribe. The creation of this record is based on the scribe's personal observations and the provider's statements to them.   I have reviewed the above medical documentation for accuracy and completeness and I concur.  Mellody Dance, DO 12/03/2018 4:45 PM       ----------------------------------------------------------------------------------------------------------------------  Subjective:   CC:    [place] [name] is a 73 y.o. female who [facility] today for review and discussion of recent bloodwork that was done in addition to f/up on chronic conditions we are managing for pt.  1. All recent blood work that we ordered was reviewed with patient today.   2. Patient was counseled on all abnormalities and we discussed dietary and lifestyle changes that could help those values (also medications when appropriate).   3. Extensive health counseling performed and all patient's concerns/ questions were addressed.  See labs below and also plan for more details of these abnormalities   Continues working, "only a couple of days per week."       When COVID hit, she had months and months off and was able to focus on herself--->  exercised more, eat and even healthier vegetarian diet and lose weight.    - Vitamin D Takes 2000 IU's of Vitamin D Daily.  Says sometimes she feels tired, but "not so fatigued I can't function."    Chronic generalized arthritis-  lrge jts: States she continues to have arthritis "out the wazoo" and aches and pains, but "other than that I can't complain."  Thinks she's in good shape for her age.  Notes "I walk pretty regularly;   Says her arthritis started as a young person and hasn't stopped  Notes the "exercise helps tremendously, but my diet helps more."  Says she's currently trying to avoid eating trigger foods, but it's "hard to avoid things like tomatoes when she's [place]."  Continues vegetarian diet and eating a lot of natural foods, "working in the [employer] you get a lot of antioxidants."  She plans to continue experimenting on adjusting her diet to help with her arthritis.    Weight Loss:  --->   Pt didn't work for over 2 months in the springtime and took an over 3 miles walk every day." Says the exercise is what has really helped with her weight loss.  Says "the more weight I lose, the less pain I have  in my hips/ jts."  Was 180 lbs at her top weight, and lost 30 lbs since last year.   - States she keeps a close eye on her weight.     - Notes that her arthritis has improved greatly since losing weight.    Shoulder pain:  States "the main thing I've noticed is my shoulder; if I'm carrying a vacuum or stressing my shoulder."    - Cardiac Health- history of paroxysmal SVT status post AV nodal ablation -Dr. Einar Gip and Dr. Lovena Le -Patient asymptomatic today  Patient notes she had her ultrasound of the heart done, but did not get the stress test done.  Says "I feel like everything else checked out and I don't know if I even need to get the stress test done." -Advised patient she should discuss that with cardiology    Wt Readings from Last 3 Encounters:  11/30/18 147 lb (66.7 kg)  11/20/18 140 lb (63.5 kg)  11/07/17 154 lb (69.9 kg)   BP Readings from Last 3 Encounters:  11/30/18 (!) 144/74  09/16/17 104/69  08/29/17 124/80   Pulse Readings from Last 3 Encounters:  11/30/18 72  09/16/17 73  08/29/17 73   BMI Readings from Last 3 Encounters:  11/30/18 25.63 kg/m  11/20/18 24.41 kg/m  11/07/17 26.43 kg/m     Patient Care Team    Relationship Specialty Notifications Start End  Mellody Dance, DO PCP - General Family Medicine  08/27/16   Adrian Prows, [place] Physician Cardiology  08/27/16   Evans Lance, MD Consulting Physician Cardiology  08/27/16   Juanita Craver, MD Consulting Physician Gastroenterology  08/27/16   Jacelyn Pi, MD Consulting Physician Endocrinology  08/27/16    Comment: Thyroid goiter  Macarthur Critchley, OD Referring Physician Optometry  08/27/16   Darleen Crocker, MD Consulting Physician Ophthalmology  08/27/16   Linton Rump, PT Physical Therapist Physical Therapy  08/27/16   Megan Salon, MD Consulting Physician Gynecology  08/27/16     Full medical history updated and reviewed in the office today  Patient Active Problem List   Diagnosis Date  Noted  . High serum low-density lipoprotein (LDL) 11/30/2018    Priority: High  . High serum high density lipoprotein (HDL) 11/30/2018    Priority: High  . Urinary incontinence 10/08/2016    Priority: High  . Obesity 08/26/2010    Priority: High  . Vegan diet 08/27/2016    Priority: Medium  . h/o Paroxysmal SVT (supraventricular tachycardia) ([place]) 10/06/2010    Priority: Medium  . Palpitations 08/26/2010    Priority: Medium  . Thyroid goiter 08/27/2016    Priority: Low  . Vitamin D insufficiency 11/30/2018  .  newer onset Exercise intolerance * 4-6 mo 08/15/2017  . Complaints of transient weakness of bilateral lower extremity 08/15/2017  . Transient episodes of numbness of both lower extremities 08/15/2017  . Atypical nevus of back 08/07/2017  . Reflux esophagitis 08/02/2017  . Persistent dry cough 08/02/2017  . Muscle spasm of lower bilateral back 08/02/2017  . Muscle spasms of neck 08/02/2017  . Hoarseness or changing voice 08/02/2017  . Left knee pain 06/30/2017  . Osteoarthritis of knee 06/20/2017  . Cough in adult 03/07/2017  . Acute maxillary sinusitis 03/07/2017  . Chronic pain of left knee 10/25/2016  . Primary osteoarthritis of both knees 10/18/2016  . Chronic pain of both knees 10/08/2016  . Generalized osteoarthritis of multiple sites 10/08/2016  . h/o Near syncope 08/20/2013  . h/o chronic Dizziness 08/20/2013  . Pain in joint, shoulder region 09/26/2012  . Biceps tendinopathy 09/26/2012  . Right wrist pain 09/26/2012  . S/P AV nodal ablation 08/05/2011    Past Medical History:  Diagnosis Date  . Arthritis   . Chest pain   . Heart murmur   . Herpes   . History of hepatitis    and mononucleosis  . Hypothyroidism   . Mononucleosis 1967  . Multinodular goiter   . Osteopenia   . Palpitations   . S/P AV nodal ablation 08/05/2011  . SVT (supraventricular tachycardia) ([place])   . Thyroid nodule     Past Surgical History:  Procedure Laterality Date  .  BREAST CYST ASPIRATION Bilateral 12/15/2000  . CATARACT EXTRACTION W/ INTRAOCULAR LENS IMPLANT Bilateral 2014  . paragard iud     8/90  . SUPRAVENTRICULAR TACHYCARDIA ABLATION N/A 08/05/2011   Procedure: SUPRAVENTRICULAR TACHYCARDIA ABLATION;  Surgeon: Evans Lance, MD;  Location[facility] CATH LAB;  Service: Cardiovascular;  Laterality: N/A;  . TONSILLECTOMY      Social History   Tobacco Use  . Smoking status: Former Smoker    Types: Cigarettes    Quit date: 03/01/1970    Years since quitting: 48.7  . Smokeless tobacco: Never Used  Substance Use Topics  . Alcohol use: Yes    Alcohol/week: 0.0 standard drinks    Comment: occasional    Family Hx: Family History  Problem Relation Age of Onset  . Dementia Mother 43       alive  . Transient ischemic attack Mother        hx of; has a pacemaker  . Hypothyroidism Mother   . Other Father 83       old age. Develop CHF the last 4 months  . Other Sister 13       died of thymoma  . Hypothyroidism Sister   . Other Maternal Aunt        nasal tumor  . Heart attack Maternal Grandfather   . Hypothyroidism Maternal Aunt   . Stroke Paternal Grandfather   . Colon cancer Neg Hx   . Esophageal cancer Neg Hx   . Pancreatic cancer Neg Hx   . Prostate cancer Neg Hx   . Rectal cancer Neg Hx   . Stomach cancer Neg Hx      Medications: Current Outpatient Medications  Medication Sig Dispense Refill  . aspirin EC 81 MG tablet Take 81 mg by mouth daily.    . cholecalciferol (VITAMIN D) 1000 units tablet Take 2,000 Units by mouth daily.    . Flaxseed, Linseed, (FLAX SEEDS PO) Take 30 mLs by mouth daily.    . folic acid (FOLVITE) [id number]  MCG tablet Take 400 mcg by mouth daily.    Marland Kitchen GINSENG PO Take by mouth daily.    Marland Kitchen levothyroxine (SYNTHROID, LEVOTHROID) 25 MCG tablet Take 1 tablet by mouth daily.  11  . Probiotic Product (PROBIOTIC FORMULA PO) Take 1 capsule by mouth daily. New chapter All-Flora.    . TURMERIC PO Take by mouth daily.    . Vitamin  D, Ergocalciferol, (DRISDOL) 1.25 MG (50000 UT) CAPS capsule Take one tablet wkly 12 capsule 3   Current Facility-Administered Medications  Medication Dose Route Frequency Provider Last Rate Last Dose  . bupivacaine (MARCAINE) 0.5 % injection 2 mL  2 mL Infiltration Once Vergene Marland, DO      . lidocaine (XYLOCAINE) 2 % (with pres) injection 20 mg  1 mL Other Once Starlene Consuegra, DO      . methylPREDNISolone acetate (DEPO-MEDROL) injection 40 mg  40 mg Intra-articular Once Oletha Tolson, DO        Allergies:  No Known Allergies   Review of Systems: General:   No F/C, wt loss Pulm:   No DIB, SOB, pleuritic chest pain Card:  No CP, palpitations Abd:  No n/v/d or pain Ext:  No inc edema from baseline  Objective:  Blood pressure (!) 144/74, pulse 72, height 5' 3.5" (1.613 m), weight 147 lb (66.7 kg), SpO2 95 %. Body mass index is 25.63 kg/m. Gen:   Well NAD, A and O *3 HEENT:    [place]/AT, EOMI,  MMM Lungs:   Normal work of breathing. CTA B/L, no Wh, rhonchi Heart:   RRR, S1, S2 WNL's, no MRG Abd:   No gross distention Exts:    warm, pink,  Brisk capillary refill, warm and well perfused.  Psych:    No HI/SI, judgement and insight good, Euthymic mood. Full Affect.   Recent Results (from the past 2160 hour(s))  T3     Status: None   Collection Time: 11/20/18  9:44 AM  Result Value Ref Range   T3, Total 116 71 - 180 ng/dL  T4, free     Status: None   Collection Time: 11/20/18  9:44 AM  Result Value Ref Range   Free T4 1.12 0.82 - 1.77 ng/dL  VITAMIN D 25 Hydroxy (Vit-D Deficiency, Fractures)     Status: None   Collection Time: 11/20/18  9:44 AM  Result Value Ref Range   Vit D, 25-Hydroxy 37.2 30.0 - 100.0 ng/mL    Comment: Vitamin D deficiency has been defined by the [place] and an Endocrine Society practice guideline as a level of serum 25-OH vitamin D less than 20 ng/mL (1,2). The Endocrine Society went on to further define vitamin D insufficiency as a  level between 21 and 29 ng/mL (2). 1. IOM (Institute of Medicine). 2010. Dietary reference    intakes for calcium and D. [place]: The    [employer]. 2. Holick MF, Binkley Brent, Bischoff-Ferrari HA, et al.    Evaluation, treatment, and prevention of vitamin D    deficiency: an Endocrine Society clinical practice    guideline. JCEM. 2011 Jul; 96(7):1911-30.   TSH     Status: None   Collection Time: 11/20/18  9:44 AM  Result Value Ref Range   TSH CANCELED uIU/mL    Comment: Test cancelled at client's request.  Result canceled by the ancillary.   Lipid panel     Status: Abnormal   Collection Time: 11/20/18  9:44 AM  Result Value Ref Range  Cholesterol, Total 199 100 - 199 mg/dL   Triglycerides 52 0 - 149 mg/dL   HDL 76 >39 mg/dL   VLDL Cholesterol Cal 10 5 - 40 mg/dL   LDL Chol Calc (NIH) 113 (H) 0 - 99 mg/dL   Chol/HDL Ratio 2.6 0.0 - 4.4 ratio    Comment:                                   T. Chol/HDL Ratio                                             Men  Women                               1/2 Avg.Risk  3.4    3.3                                   Avg.Risk  5.0    4.4                                2X Avg.Risk  9.6    7.1                                3X Avg.Risk 23.4   11.0   Hemoglobin A1c     Status: None   Collection Time: 11/20/18  9:44 AM  Result Value Ref Range   Hgb A1c MFr Bld CANCELED %    Comment: Test cancelled at client's request.          Prediabetes: 5.7 - 6.4          Diabetes: >6.4          Glycemic control for adults with diabetes: <7.0  Result canceled by the ancillary.   Comprehensive metabolic panel     Status: None   Collection Time: 11/20/18  9:44 AM  Result Value Ref Range   Glucose 86 65 - 99 mg/dL   BUN 12 8 - 27 mg/dL   Creatinine, Ser 0.86 0.57 - 1.00 mg/dL   GFR calc non Af Amer 68 >59 mL/min/1.73   GFR calc Af Amer 78 >59 mL/min/1.73   BUN/Creatinine Ratio 14 12 - 28   Sodium 139 134 - 144 mmol/L   Potassium 3.9 3.5  - 5.2 mmol/L   Chloride 104 96 - 106 mmol/L   CO2 25 20 - 29 mmol/L   Calcium 9.0 8.7 - 10.3 mg/dL   Total Protein 6.3 6.0 - 8.5 g/dL   Albumin 3.9 3.7 - 4.7 g/dL   Globulin, Total 2.4 1.5 - 4.5 g/dL   Albumin/Globulin Ratio 1.6 1.2 - 2.2   Bilirubin Total 0.6 0.0 - 1.2 mg/dL   Alkaline Phosphatase 81 39 - 117 IU/L   AST 19 0 - 40 IU/L   ALT 22 0 - 32 IU/L  CBC with Differential/Platelet     Status: None   Collection Time: 11/20/18  9:44 AM  Result Value Ref Range   WBC 5.5 3.4 - 10.8 x10E3/uL   RBC  3.98 3.77 - 5.28 x10E6/uL   Hemoglobin 12.6 11.1 - 15.9 g/dL   Hematocrit 37.3 34.0 - 46.6 %   MCV 94 79 - 97 fL   MCH 31.7 26.6 - 33.0 pg   MCHC 33.8 31.5 - 35.7 g/dL   RDW 12.2 11.7 - 15.4 %   Platelets 256 150 - 450 x10E3/uL   Neutrophils 50 Not Estab. %   Lymphs 33 Not Estab. %   Monocytes 12 Not Estab. %   Eos 4 Not Estab. %   Basos 1 Not Estab. %   Neutrophils Absolute 2.7 1.4 - 7.0 x10E3/uL   Lymphocytes Absolute 1.8 0.7 - 3.1 x10E3/uL   Monocytes Absolute 0.7 0.1 - 0.9 x10E3/uL   EOS (ABSOLUTE) 0.2 0.0 - 0.4 x10E3/uL   Basophils Absolute 0.0 0.0 - 0.2 x10E3/uL   Immature Granulocytes 0 Not Estab. %   Immature Grans (Abs) 0.0 0.0 - 0.1 x10E3/uL

## 2018-11-30 NOTE — Patient Instructions (Addendum)
  Please make sure you take the supplement of calcium 1200 mg daily.  Be aware this can cause constipation so keep your eye out for that   Guidelines for a Low Cholesterol, Low Saturated Fat Diet   Fats - Limit total intake of fats and oils. - Avoid butter, stick margarine, shortening, lard, palm and coconut oils. - Limit mayonnaise, salad dressings, gravies and sauces, unless they are homemade with low-fat ingredients. - Limit chocolate. - Choose low-fat and nonfat products, such as low-fat mayonnaise, low-fat or non-hydrogenated peanut butter, low-fat or fat-free salad dressings and nonfat gravy. - Use vegetable oil, such as canola or olive oil. - Look for margarine that does not contain trans fatty acids. - Use nuts in moderate amounts. - Read ingredient labels carefully to determine both amount and type of fat present in foods. Limit saturated and trans fats! - Avoid high-fat processed and convenience foods.  Meats and Meat Alternatives - Choose fish, chicken, [place] and lean meats. - Use dried beans, peas, lentils and tofu. - Limit egg yolks to three to four per week. - If you eat red meat, limit to no more than three servings per week and choose loin or round cuts. - Avoid fatty meats, such as bacon, sausage, franks, luncheon meats and ribs. - Avoid all organ meats, including liver.  Dairy - Choose nonfat or low-fat milk, yogurt and cottage cheese. - Most cheeses are high in fat. Choose cheeses made from non-fat milk, such as mozzarella and ricotta cheese. - Choose light or fat-free cream cheese and sour cream. - Avoid cream and sauces made with cream.  Fruits and Vegetables - Eat a wide variety of fruits and vegetables. - Use lemon juice, vinegar or "mist" olive oil on vegetables. - Avoid adding sauces, fat or oil to vegetables.  Breads, Cereals and Grains - Choose whole-grain breads, cereals, pastas and rice. - Avoid high-fat snack foods, such as granola, cookies, pies,  pastries, doughnuts and croissants.  Cooking Tips - Avoid deep fried foods. - Trim visible fat off meats and remove skin from poultry before cooking. - Bake, broil, boil, poach or roast poultry, fish and lean meats. - Drain and discard fat that drains out of meat as you cook it. - Add little or no fat to foods. - Use vegetable oil sprays to grease pans for cooking or baking. - Steam vegetables. - Use herbs or no-oil marinades to flavor foods.

## 2018-12-07 ENCOUNTER — Encounter: Payer: Self-pay | Admitting: Family Medicine

## 2018-12-07 ENCOUNTER — Other Ambulatory Visit: Payer: Self-pay

## 2018-12-07 ENCOUNTER — Ambulatory Visit (INDEPENDENT_AMBULATORY_CARE_PROVIDER_SITE_OTHER): Payer: Medicare Other | Admitting: Family Medicine

## 2018-12-07 VITALS — BP 148/75 | HR 75 | Ht 63.5 in | Wt 149.0 lb

## 2018-12-07 DIAGNOSIS — M25521 Pain in right elbow: Secondary | ICD-10-CM | POA: Diagnosis not present

## 2018-12-07 DIAGNOSIS — N3941 Urge incontinence: Secondary | ICD-10-CM | POA: Diagnosis not present

## 2018-12-07 DIAGNOSIS — R350 Frequency of micturition: Secondary | ICD-10-CM

## 2018-12-07 DIAGNOSIS — M25511 Pain in right shoulder: Secondary | ICD-10-CM

## 2018-12-07 DIAGNOSIS — G8929 Other chronic pain: Secondary | ICD-10-CM
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
## 2018-12-08 DIAGNOSIS — E049 Nontoxic goiter, unspecified: Secondary | ICD-10-CM | POA: Diagnosis not present

## 2018-12-11 ENCOUNTER — Telehealth: Payer: Self-pay

## 2018-12-11 NOTE — Telephone Encounter (Signed)
Left message for patient to call back to schedule appointment.

## 2018-12-14 DIAGNOSIS — Z0120 Encounter for dental examination and cleaning without abnormal findings: Secondary | ICD-10-CM | POA: Diagnosis not present

## 2018-12-15 DIAGNOSIS — Z23 Encounter for immunization: Secondary | ICD-10-CM | POA: Diagnosis not present

## 2018-12-15 DIAGNOSIS — E049 Nontoxic goiter, unspecified: Secondary | ICD-10-CM | POA: Diagnosis not present

## 2018-12-20 ENCOUNTER — Encounter: Payer: Self-pay | Admitting: Family Medicine

## 2018-12-22 ENCOUNTER — Other Ambulatory Visit: Payer: Self-pay | Admitting: Endocrinology

## 2018-12-22 DIAGNOSIS — E049 Nontoxic goiter, unspecified: Secondary | ICD-10-CM

## 2019-01-08 ENCOUNTER — Other Ambulatory Visit: Payer: Self-pay | Admitting: Obstetrics & Gynecology

## 2019-01-08 DIAGNOSIS — Z1231 Encounter for screening mammogram for malignant neoplasm of breast: Secondary | ICD-10-CM

## 2019-01-15 ENCOUNTER — Ambulatory Visit: Payer: Self-pay | Admitting: Urology

## 2019-02-05 ENCOUNTER — Ambulatory Visit: Payer: Self-pay | Admitting: Urology

## 2019-03-05 ENCOUNTER — Ambulatory Visit: Payer: Self-pay | Admitting: Urology

## 2019-04-02 ENCOUNTER — Other Ambulatory Visit: Payer: Self-pay

## 2019-04-02 ENCOUNTER — Ambulatory Visit: Payer: Medicare Other | Admitting: Urology

## 2019-04-02 ENCOUNTER — Encounter: Payer: Self-pay | Admitting: Urology

## 2019-04-02 VITALS — BP 134/76 | HR 76 | Ht 63.5 in | Wt 146.0 lb

## 2019-04-02 DIAGNOSIS — R32 Unspecified urinary incontinence: Secondary | ICD-10-CM | POA: Diagnosis not present

## 2019-04-02 DIAGNOSIS — N3941 Urge incontinence: Secondary | ICD-10-CM

## 2019-04-02 LAB — URINALYSIS, COMPLETE
Bilirubin, UA: NEGATIVE
Glucose, UA: NEGATIVE
Ketones, UA: NEGATIVE
Leukocytes,UA: NEGATIVE
Nitrite, UA: NEGATIVE
Protein,UA: NEGATIVE
RBC, UA: NEGATIVE
Specific Gravity, UA: 1.01 (ref 1.005–1.030)
Urobilinogen, Ur: 0.2 mg/dL (ref 0.2–1.0)
pH, UA: 7 (ref 5.0–7.5)

## 2019-04-02 LAB — MICROSCOPIC EXAMINATION
Bacteria, UA: NONE SEEN
RBC, Urine: NONE SEEN /hpf (ref 0–2)

## 2019-04-02 LAB — BLADDER SCAN AMB NON-IMAGING: Scan Result: 17

## 2019-04-02 MED ORDER — MIRABEGRON ER 50 MG PO TB24: 50.0000 mg | ORAL_TABLET | Freq: Every day | ORAL | 11 refills | Status: DC

## 2019-04-02 NOTE — Addendum Note (Signed)
Addended by: Verlene Mayer A on: 04/02/2019 02:08 PM   Modules accepted: Orders

## 2019-04-02 NOTE — Progress Notes (Signed)
04/02/2019  1:45 PM   Lindsay [name] [birth year] [id number]  Referring provider: Mellody Dance, [place] [employer],  [place] [address]  Chief Complaint  Patient presents with  . Urinary Incontinence    HPI: Patient has urgency incontinence worsening over many months.  She wears a pad for cough times and leaks that she is out in public.  She is bathroom mapping and again does not always leak.  No stress incontinence or bedwetting.  Voids every 30 to 60 minutes and cannot hold it for 2 hours.  Gets up 3-4 times a night  No history of hysterectomy or neurologic issues.  No medical treatment.  No previous bladder surgery.  No kidney stones or infections     PMH: Past Medical History:  Diagnosis Date  . Arthritis   . Chest pain   . Heart murmur   . Herpes   . History of hepatitis    and mononucleosis  . Hypothyroidism   . Mononucleosis 1967  . Multinodular goiter   . Osteopenia   . Palpitations   . S/P AV nodal ablation 08/05/2011  . SVT (supraventricular tachycardia) ([place])   . Thyroid nodule     Surgical History: Past Surgical History:  Procedure Laterality Date  . BREAST CYST ASPIRATION Bilateral 12/15/2000  . CATARACT EXTRACTION W/ INTRAOCULAR LENS IMPLANT Bilateral 2014  . paragard iud     8/90  . SUPRAVENTRICULAR TACHYCARDIA ABLATION N/A 08/05/2011   Procedure: SUPRAVENTRICULAR TACHYCARDIA ABLATION;  Surgeon: Evans Lance, MD;  Location: [facility] CATH LAB;  Service: Cardiovascular;  Laterality: N/A;  . TONSILLECTOMY      Home Medications:  Allergies as of 04/02/2019   No Known Allergies     Medication List       Accurate as of April 02, 2019  1:45 PM. If you have any questions, ask your nurse or doctor.        aspirin EC 81 MG tablet Take 81 mg by mouth daily.   cholecalciferol 1000 units tablet Commonly known as: VITAMIN D Take 2,000 Units by mouth daily.   FLAX SEEDS PO Take 30 mLs by mouth daily.   folic acid Q000111Q MCG tablet Commonly  known as: FOLVITE Take 400 mcg by mouth daily.   GINSENG PO Take by mouth daily.   levothyroxine 25 MCG tablet Commonly known as: SYNTHROID Take 1 tablet by mouth daily.   PROBIOTIC FORMULA PO Take 1 capsule by mouth daily. New chapter All-Flora.   TURMERIC PO Take by mouth daily.   Vitamin D (Ergocalciferol) 1.25 MG (50000 UNIT) Caps capsule Commonly known as: DRISDOL Take one tablet wkly       Allergies: No Known Allergies  Family History: Family History  Problem Relation Age of Onset  . Dementia Mother 61       alive  . Transient ischemic attack Mother        hx of; has a pacemaker  . Hypothyroidism Mother   . Other Father 107       old age. Develop CHF the last 4 months  . Other Sister 58       died of thymoma  . Hypothyroidism Sister   . Other Maternal Aunt        nasal tumor  . Heart attack Maternal Grandfather   . Hypothyroidism Maternal Aunt   . Stroke Paternal Grandfather   . Colon cancer Neg Hx   . Esophageal cancer Neg Hx   . Pancreatic cancer Neg Hx   .  Prostate cancer Neg Hx   . Rectal cancer Neg Hx   . Stomach cancer Neg Hx     Social History:  reports that she quit smoking about 49 years ago. Her smoking use included cigarettes. She has never used smokeless tobacco. She reports current alcohol use. She reports that she does not use drugs.  ROS: UROLOGY Frequent Urination?: Yes Hard to postpone urination?: Yes Burning/pain with urination?: No Get up at night to urinate?: Yes Leakage of urine?: Yes Urine stream starts and stops?: No Trouble starting stream?: No Do you have to strain to urinate?: No Blood in urine?: No Urinary tract infection?: No Sexually transmitted disease?: No Injury to kidneys or bladder?: No Painful intercourse?: No Weak stream?: No Currently pregnant?: No Vaginal bleeding?: No Last menstrual period?: n  Gastrointestinal Nausea?: No Vomiting?: No Indigestion/heartburn?: No Diarrhea?: No Constipation?: No   Constitutional Fever: No Night sweats?: No Weight loss?: No Fatigue?: No  Skin Skin rash/lesions?: No Itching?: No  Eyes Blurred vision?: No Double vision?: No  Ears/Nose/Throat Sore throat?: No Sinus problems?: No  Hematologic/Lymphatic Swollen glands?: No Easy bruising?: No  Cardiovascular Leg swelling?: No Chest pain?: No  Respiratory Cough?: No Shortness of breath?: No  Endocrine Excessive thirst?: No  Musculoskeletal Back pain?: No Joint pain?: No  Neurological Headaches?: No Dizziness?: No  Psychologic Depression?: No Anxiety?: No  Physical Exam: BP 134/76   Pulse 76   Ht 5' 3.5" (1.613 m)   Wt 146 lb (66.2 kg)   BMI 25.46 kg/m   Constitutional:  Alert and oriented, No acute distress. HEENT: [place] AT, moist mucus membranes.  Trachea midline, no masses. Cardiovascular: No clubbing, cyanosis, or edema. Respiratory: Normal respiratory effort, no increased work of breathing. GI: Abdomen is soft, nontender, nondistended, no abdominal masses GU: Narrow introitus.  Small grade 1 cystocele larger near the trigone but did not descend.  It was asymptomatic.  Mild hypermobility the bladder neck and negative cough test Skin: No rashes, bruises or suspicious lesions. Lymph: No cervical or inguinal adenopathy. Neurologic: Grossly intact, no focal deficits, moving all 4 extremities. Psychiatric: Normal mood and affect.  Laboratory Data: Lab Results  Component Value Date   WBC 5.5 11/20/2018   HGB 12.6 11/20/2018   HCT 37.3 11/20/2018   MCV 94 11/20/2018   PLT 256 11/20/2018    Lab Results  Component Value Date   CREATININE 0.86 11/20/2018    No results found for: PSA  No results found for: TESTOSTERONE  Lab Results  Component Value Date   HGBA1C CANCELED 11/20/2018    Urinalysis    Component Value Date/Time   COLORURINE YELLOW 08/20/2013 1030   APPEARANCEUR CLEAR 08/20/2013 1030   LABSPEC 1.020 08/20/2013 1030   PHURINE 6.5 08/20/2013  1030   GLUCOSEU NEGATIVE 08/20/2013 1030   HGBUR NEGATIVE 08/20/2013 1030   BILIRUBINUR NEGATIVE 08/20/2013 1030   KETONESUR 15 (A) 08/20/2013 1030   PROTEINUR NEGATIVE 08/20/2013 1030   UROBILINOGEN 0.2 08/20/2013 1030   NITRITE NEGATIVE 08/20/2013 1030   LEUKOCYTESUR NEGATIVE 08/20/2013 1030    Pertinent Imaging:   Assessment & Plan: Patient has mild overactive bladder and urgency incontinence.  Medical behavioral therapy and physical therapy discussed if we reach her treatment goal I likely would try to stop the medication in the future  We will see the physical therapist and see me in 4 months.  She took the Myrbetriq samples and prescription and she may or may not start them  There are no diagnoses linked  to this encounter.  No follow-ups on file.  Reece Packer, MD  [place] [address] [place], [place] [address] [phone]

## 2019-04-20 ENCOUNTER — Ambulatory Visit: Payer: Medicare Other

## 2019-04-20 ENCOUNTER — Other Ambulatory Visit: Payer: Medicare Other

## 2019-04-30 ENCOUNTER — Other Ambulatory Visit: Payer: Medicare Other

## 2019-05-04 ENCOUNTER — Encounter: Payer: Self-pay | Admitting: Physical Therapy

## 2019-05-04 ENCOUNTER — Other Ambulatory Visit: Payer: Self-pay

## 2019-05-04 ENCOUNTER — Ambulatory Visit: Payer: Medicare Other | Attending: Urology | Admitting: Physical Therapy

## 2019-05-04 ENCOUNTER — Ambulatory Visit
Admission: RE | Admit: 2019-05-04 | Discharge: 2019-05-04 | Disposition: A | Discharge status: Home / Self Care | Payer: Medicare Other | Source: Ambulatory Visit | Attending: Obstetrics & Gynecology | Admitting: Obstetrics & Gynecology

## 2019-05-04 DIAGNOSIS — R278 Other lack of coordination: Secondary | ICD-10-CM | POA: Insufficient documentation

## 2019-05-04 DIAGNOSIS — Z1231 Encounter for screening mammogram for malignant neoplasm of breast: Secondary | ICD-10-CM

## 2019-05-04 DIAGNOSIS — M6281 Muscle weakness (generalized): Secondary | ICD-10-CM

## 2019-05-04 NOTE — Therapy (Signed)
[facility] [address] [place], [place], [address] Phone: [phone]   Fax:  [phone]  Physical Therapy Evaluation  Patient Details  Name: Lindsay [name] MRN: [id number] Date of Birth: [birth year] Referring Provider (PT): Bjorn Loser, MD   Encounter Date: 05/04/2019  PT End of Session - 05/04/19 1113    Visit Number  1    Date for PT Re-Evaluation  06/29/19    PT Start Time  1113    PT Stop Time  1158    PT Time Calculation (min)  45 min    Activity Tolerance  Patient tolerated treatment well    Behavior During Therapy  [facility] for tasks assessed/performed       Past Medical History:  Diagnosis Date  . Arthritis   . Chest pain   . Heart murmur   . Herpes   . History of hepatitis    and mononucleosis  . Hypothyroidism   . Mononucleosis 1967  . Multinodular goiter   . Osteopenia   . Palpitations   . S/P AV nodal ablation 08/05/2011  . SVT (supraventricular tachycardia) ([place])   . Thyroid nodule     Past Surgical History:  Procedure Laterality Date  . BREAST CYST ASPIRATION Bilateral 12/15/2000  . CATARACT EXTRACTION W/ INTRAOCULAR LENS IMPLANT Bilateral 2014  . paragard iud     8/90  . SUPRAVENTRICULAR TACHYCARDIA ABLATION N/A 08/05/2011   Procedure: SUPRAVENTRICULAR TACHYCARDIA ABLATION;  Surgeon: Evans Lance, MD;  Location: [facility] CATH LAB;  Service: Cardiovascular;  Laterality: N/A;  . TONSILLECTOMY      There were no vitals filed for this visit.   Subjective Assessment - 05/04/19 1118    Subjective  Pt has what sounds like urge incontinence She is bathroom mapping but does not always leak.  No stress incontinence or bedwetting.  Voids every 30 to 60 minutes and cannot hold it for 2 hours.  Gets up 3-4 times a night.  Wears a pad just in case she coughs.  Problem is worsening    Pertinent History  3 pregnancies; severe Lt hip OA; hx or repetitive lifting    Patient Stated Goals  reduce the  urge    Currently in Pain?  No/denies         [facility] PT Assessment - 05/04/19 0001      Assessment   Medical Diagnosis  R32 (ICD-10-CM) - Urinary incontinence, unspecified type    Referring Provider (PT)  MacDiarmid, Nicki Reaper, MD    Onset Date/Surgical Date  --   many years     Precautions   Precautions  None      Restrictions   Weight Bearing Restrictions  No      Balance Screen   Has the patient fallen in the past 6 months  Yes    How many times?  slipped in mud 1x      Section residence    Living Arrangements  Non-relatives/Friends      Prior Function   Level of [place]  Retired    Leisure  walking; birding; gardening      Cognition   Overall Cognitive Status  Within Functional Limits for tasks assessed      Posture/Postural Control   Posture/Postural Control  Postural limitations    Postural Limitations  Rounded Shoulders      ROM / Strength   AROM / PROM / Strength  PROM;Strength;AROM      PROM   Overall PROM Comments  Lt hip flex and IR 25% limited; Rt ER 25% limited      Strength   Overall Strength Comments  hip abduction 4/5 bilat                Objective measurements completed on examination: See above findings.    Pelvic Floor Special Questions - 05/04/19 0001    Prior Pelvic/Prostate Exam  Yes    Prior Pregnancies  Yes    Number of Pregnancies  4   1 miscarriage; 1 still born   Number of Vaginal Deliveries  2    Currently Sexually Active  No    Urinary Leakage  Yes    How often  3/day just a little    Pad use  yes - toilet paper     Activities that cause leaking  With strong urge    Urinary urgency  Yes    Urinary frequency  every 30 minutes some days; nocturia 4x average    Fecal incontinence  No    Perineal Body/Introitus   Descended    Pelvic Floor Internal Exam  pt identity confirmed and informed consent given to assess pelvic floor    Exam Type  Vaginal     Palpation  Lt levator weakness possible old tear from vaginal delivery    Strength  fair squeeze, definite lift    Strength # of seconds  2    Tone  became elevated after contracting               PT Education - 05/04/19 2119    Education Details  urge drills    Person(s) Educated  Patient    Methods  Explanation;Demonstration;Handout;Verbal cues    Comprehension  Verbalized understanding;Returned demonstration       PT Short Term Goals - 05/04/19 2155      PT SHORT TERM GOAL #1   Title  ind with urge drills    Time  4    Period  Weeks    Status  New    Target Date  06/01/19      PT SHORT TERM GOAL #2   Title  ind with initial HEP    Time  4    Period  Weeks    Status  New    Target Date  06/01/19        PT Long Term Goals - 05/04/19 2140      PT LONG TERM GOAL #1   Title  ind wtih advanced HEP    Time  8    Period  Weeks    Status  New    Target Date  06/29/19      PT LONG TERM GOAL #2   Title  Pt able to control urge to void for at least 30 minutes in order to go out without worrying about if a restroom is close    Time  8    Period  Weeks    Status  New    Target Date  06/29/19      PT LONG TERM GOAL #3   Title  Pt will report no leakage due to improved endurance    Time  8    Period  Weeks    Status  New    Target Date  06/29/19      PT LONG TERM GOAL #4   Title  Pt will report she is using the  restroom every 1.5-2 hours    Baseline  every 30 minutes    Time  8    Period  Weeks    Status  New    Target Date  06/29/19             Plan - 05/04/19 2119    Clinical Impression Statement  Pt presents to clinic due to increased urge to void and unable to get to the restroom in time without small amount of leakage.  Pt has tension throughout her low back and hamstrings . Pt has pelvic floor weakness of 3/5 with only 2 second hold.  There was difficulty coordinating and relaxing after contracting.  Pt will benefit from skilled PT to  improved on impairments and return to maximum function and quality of life.    Examination-Activity Limitations  Toileting    Examination-Participation Restrictions  Community Activity    Stability/Clinical Decision Making  Stable/Uncomplicated    Clinical Decision Making  Low    Rehab Potential  Excellent    PT Frequency  1x / week    PT Duration  8 weeks    PT Treatment/Interventions  ADLs/Self Care Home Management;Biofeedback;Cryotherapy;Electrical Stimulation;Moist Heat;Neuromuscular re-education;Therapeutic exercise;Therapeutic activities;Manual techniques;Taping;Patient/family education;Dry needling;Passive range of motion    PT Next Visit Plan  stretch and relax pelvic floor, biofeedback;    Consulted and Agree with Plan of Care  Patient       Patient will benefit from skilled therapeutic intervention in order to improve the following deficits and impairments:  Decreased strength, Increased muscle spasms, Decreased endurance, Impaired flexibility  Visit Diagnosis: Muscle weakness (generalized)  Other lack of coordination     Problem List Patient Active Problem List   Diagnosis Date Noted  . High serum low-density lipoprotein (LDL) 11/30/2018  . Vitamin D insufficiency 11/30/2018  . High serum high density lipoprotein (HDL) 11/30/2018  . newer onset Exercise intolerance * 4-6 mo 08/15/2017  . Complaints of transient weakness of bilateral lower extremity 08/15/2017  . Transient episodes of numbness of both lower extremities 08/15/2017  . Atypical nevus of back 08/07/2017  . Reflux esophagitis 08/02/2017  . Persistent dry cough 08/02/2017  . Muscle spasm of lower bilateral back 08/02/2017  . Muscle spasms of neck 08/02/2017  . Hoarseness or changing voice 08/02/2017  . Left knee pain 06/30/2017  . Osteoarthritis of knee 06/20/2017  . Cough in adult 03/07/2017  . Acute maxillary sinusitis 03/07/2017  . Chronic pain of left knee 10/25/2016  . Primary osteoarthritis of  both knees 10/18/2016  . Urinary incontinence 10/08/2016  . Chronic pain of both knees 10/08/2016  . Generalized osteoarthritis of multiple sites 10/08/2016  . Vegan diet 08/27/2016  . Thyroid goiter 08/27/2016  . h/o Near syncope 08/20/2013  . h/o chronic Dizziness 08/20/2013  . Pain in joint, shoulder region 09/26/2012  . Biceps tendinopathy 09/26/2012  . Right wrist pain 09/26/2012  . S/P AV nodal ablation 08/05/2011  . h/o Paroxysmal SVT (supraventricular tachycardia) ([place]) 10/06/2010  . Palpitations 08/26/2010  . Obesity 08/26/2010    Jule Ser, PT 05/04/2019, 9:55 PM  [facility] [address] [place], [place], [address] Phone: [phone]   Fax:  [phone]  Name: Lindsay [name] MRN: [id number] Date of Birth: 06/29/1945

## 2019-05-07 ENCOUNTER — Other Ambulatory Visit: Payer: Medicare Other

## 2019-05-11 ENCOUNTER — Other Ambulatory Visit: Payer: Self-pay

## 2019-05-11 ENCOUNTER — Other Ambulatory Visit: Payer: Medicare Other

## 2019-05-11 DIAGNOSIS — E559 Vitamin D deficiency, unspecified: Secondary | ICD-10-CM

## 2019-05-12 LAB — VITAMIN D 25 HYDROXY (VIT D DEFICIENCY, FRACTURES): Vit D, 25-Hydroxy: 49.5 ng/mL (ref 30.0–100.0)

## 2019-05-14 ENCOUNTER — Ambulatory Visit: Payer: Medicare Other | Admitting: Physical Therapy

## 2019-05-21 ENCOUNTER — Ambulatory Visit: Payer: Medicare Other | Admitting: Physical Therapy

## 2019-05-21 ENCOUNTER — Encounter: Payer: Self-pay | Admitting: Physical Therapy

## 2019-05-21 ENCOUNTER — Other Ambulatory Visit: Payer: Self-pay

## 2019-05-21 DIAGNOSIS — R278 Other lack of coordination: Secondary | ICD-10-CM | POA: Diagnosis not present

## 2019-05-21 DIAGNOSIS — M6281 Muscle weakness (generalized): Secondary | ICD-10-CM

## 2019-05-21 NOTE — Therapy (Signed)
[facility] [address], [place], [place], [address] Phone: [phone]   Fax:  [phone]  Physical Therapy Treatment  Patient Details  Name: Lindsay [name] MRN: [id number] Date of Birth: [birth year] Referring Provider (PT): Bjorn Loser, MD   Encounter Date: 05/21/2019  PT End of Session - 05/21/19 1707    Visit Number  2    Date for PT Re-Evaluation  06/29/19    PT Start Time  [id number]    PT Stop Time  1612    PT Time Calculation (min)  42 min    Activity Tolerance  Patient tolerated treatment well    Behavior During Therapy  [facility] for tasks assessed/performed       Past Medical History:  Diagnosis Date  . Arthritis   . Chest pain   . Heart murmur   . Herpes   . History of hepatitis    and mononucleosis  . Hypothyroidism   . Mononucleosis 1967  . Multinodular goiter   . Osteopenia   . Palpitations   . S/P AV nodal ablation 08/05/2011  . SVT (supraventricular tachycardia) ([place])   . Thyroid nodule     Past Surgical History:  Procedure Laterality Date  . BREAST CYST ASPIRATION Bilateral 12/15/2000  . CATARACT EXTRACTION W/ INTRAOCULAR LENS IMPLANT Bilateral 2014  . paragard iud     8/90  . SUPRAVENTRICULAR TACHYCARDIA ABLATION N/A 08/05/2011   Procedure: SUPRAVENTRICULAR TACHYCARDIA ABLATION;  Surgeon: Evans Lance, MD;  Location: [facility] CATH LAB;  Service: Cardiovascular;  Laterality: N/A;  . TONSILLECTOMY      There were no vitals filed for this visit.  Subjective Assessment - 05/21/19 1715    Subjective  Pt states she has not had any changes, trying to do the urge drills as best she can    Patient Stated Goals  reduce the urge    Currently in Pain?  No/denies                       [facility] Adult PT Treatment/Exercise - 05/21/19 0001      Self-Care   Self-Care  Other Self-Care Comments    Other Self-Care Comments   reviewed urge drills      Neuro Re-ed    Neuro Re-ed Details    biofeedback; max 82mV; resting at 1.40mV; hold 2-3 sec before starts to release contraction      Exercises   Exercises  Lumbar      Lumbar Exercises: Stretches   Active Hamstring Stretch  3 reps;20 seconds    Figure 4 Stretch  3 reps;20 seconds      Lumbar Exercises: Seated   Other Seated Lumbar Exercises  ball squeeze 3 sec hold      Lumbar Exercises: Supine   AB Set Limitations  hooklying kegel 3 sec    Bridge with Ball Squeeze Limitations  ball squeeze with kegel 3 sec    Straight Leg Raises Limitations  straight leg IR kegel 3 sec      Lumbar Exercises: Prone   Other Prone Lumbar Exercises  heel press - kegel 3 sec on/off -              PT Education - 05/21/19 1714    Education Details  Access Code: [email or web address]    Person(s) Educated  Patient    Methods  Explanation;Demonstration;Tactile cues;Verbal cues;Handout    Comprehension  Verbalized understanding;Returned demonstration       PT Short  Term Goals - 05/04/19 2155      PT SHORT TERM GOAL #1   Title  ind with urge drills    Time  4    Period  Weeks    Status  New    Target Date  06/01/19      PT SHORT TERM GOAL #2   Title  ind with initial HEP    Time  4    Period  Weeks    Status  New    Target Date  06/01/19        PT Long Term Goals - 05/04/19 2140      PT LONG TERM GOAL #1   Title  ind wtih advanced HEP    Time  8    Period  Weeks    Status  New    Target Date  06/29/19      PT LONG TERM GOAL #2   Title  Pt able to control urge to void for at least 30 minutes in order to go out without worrying about if a restroom is close    Time  8    Period  Weeks    Status  New    Target Date  06/29/19      PT LONG TERM GOAL #3   Title  Pt will report no leakage due to improved endurance    Time  8    Period  Weeks    Status  New    Target Date  06/29/19      PT LONG TERM GOAL #4   Title  Pt will report she is using the restroom every 1.5-2 hours    Baseline  every 30 minutes    Time  8     Period  Weeks    Status  New    Target Date  06/29/19            Plan - 05/21/19 1707    Clinical Impression Statement  Pt did okay with biofeedback but did not like looking at it because she felt the video game feeling made her anxious.  Used as a guide for PT to help check how she was doing .  She did well with spot checks tactile cues.  Pt needed VC and TC to keep from sucking in to get kegel.  Did well with ball squeeze.    PT Treatment/Interventions  ADLs/Self Care Home Management;Biofeedback;Cryotherapy;Electrical Stimulation;Moist Heat;Neuromuscular re-education;Therapeutic exercise;Therapeutic activities;Manual techniques;Taping;Patient/family education;Dry needling;Passive range of motion    PT Next Visit Plan  porgress strength and endurance as tolerated; hip rotation stretches, hip flexor stretches, thoracic mobility    PT Home Exercise Plan  Access Code: [email or web address]    Consulted and Agree with Plan of Care  Patient       Patient will benefit from skilled therapeutic intervention in order to improve the following deficits and impairments:  Decreased strength, Increased muscle spasms, Decreased endurance, Impaired flexibility  Visit Diagnosis: Muscle weakness (generalized)  Other lack of coordination     Problem List Patient Active Problem List   Diagnosis Date Noted  . High serum low-density lipoprotein (LDL) 11/30/2018  . Vitamin D insufficiency 11/30/2018  . High serum high density lipoprotein (HDL) 11/30/2018  . newer onset Exercise intolerance * 4-6 mo 08/15/2017  . Complaints of transient weakness of bilateral lower extremity 08/15/2017  . Transient episodes of numbness of both lower extremities 08/15/2017  . Atypical nevus of back 08/07/2017  . Reflux esophagitis 08/02/2017  .  Persistent dry cough 08/02/2017  . Muscle spasm of lower bilateral back 08/02/2017  . Muscle spasms of neck 08/02/2017  . Hoarseness or changing voice 08/02/2017  . Left knee pain  06/30/2017  . Osteoarthritis of knee 06/20/2017  . Cough in adult 03/07/2017  . Acute maxillary sinusitis 03/07/2017  . Chronic pain of left knee 10/25/2016  . Primary osteoarthritis of both knees 10/18/2016  . Urinary incontinence 10/08/2016  . Chronic pain of both knees 10/08/2016  . Generalized osteoarthritis of multiple sites 10/08/2016  . Vegan diet 08/27/2016  . Thyroid goiter 08/27/2016  . h/o Near syncope 08/20/2013  . h/o chronic Dizziness 08/20/2013  . Pain in joint, shoulder region 09/26/2012  . Biceps tendinopathy 09/26/2012  . Right wrist pain 09/26/2012  . S/P AV nodal ablation 08/05/2011  . h/o Paroxysmal SVT (supraventricular tachycardia) ([place]) 10/06/2010  . Palpitations 08/26/2010  . Obesity 08/26/2010    Jule Ser, PT 05/21/2019, 5:17 PM  [facility] [address], [place], [place], [address] Phone: [phone]   Fax:  [phone]  Name: Lindsay [name] MRN: [id number] Date of Birth: 05-30-1945

## 2019-05-21 NOTE — Patient Instructions (Signed)
Access Code: [email or web address] URL: [email or web address] Date: 05/21/2019 Prepared by: Jari Favre  Exercises Supine Hip Adductor Squeeze with Small Ball - 3 x daily - 7 x weekly - 10 reps - 1 sets - 3 sec hold Seated Pelvic Floor Contraction with Isometric Hip Adduction - 3 x daily - 7 x weekly - 10 reps - 1 sets - 3 sechold, 3 sec rest hold Seated Hamstring Stretch - 1 x daily - 7 x weekly - 3 reps - 1 sets - 30 sec hold Seated Figure 4 Piriformis Stretch - 1 x daily - 7 x weekly - 3 reps - 1 sets - 30 hold

## 2019-05-28 ENCOUNTER — Ambulatory Visit: Payer: Medicare Other | Admitting: Physical Therapy

## 2019-05-28 ENCOUNTER — Other Ambulatory Visit: Payer: Self-pay

## 2019-05-28 DIAGNOSIS — M6281 Muscle weakness (generalized): Secondary | ICD-10-CM | POA: Diagnosis not present

## 2019-05-28 DIAGNOSIS — R278 Other lack of coordination: Secondary | ICD-10-CM | POA: Diagnosis not present

## 2019-05-28 NOTE — Patient Instructions (Signed)
Access Code: [email or web address] URL: [email or web address] Date: 05/28/2019 Prepared by: Jari Favre  Exercises Supine Hip Adductor Squeeze with Small Ball - 3 x daily - 7 x weekly - 10 reps - 1 sets - 3 sec hold Seated Pelvic Floor Contraction with Isometric Hip Adduction - 3 x daily - 7 x weekly - 10 reps - 1 sets - 3 sechold, 3 sec rest hold Seated Hamstring Stretch - 1 x daily - 7 x weekly - 3 reps - 1 sets - 30 sec hold Seated Figure 4 Piriformis Stretch - 1 x daily - 7 x weekly - 3 reps - 1 sets - 30 hold Quadruped Transversus Abdominis Bracing - 1 x daily - 7 x weekly - 2 sets - 10 reps - 5 sec hold Seated Thoracic Flexion and Rotation with Arms Crossed - 1 x daily - 7 x weekly - 10 reps - 1 sets - 5 sec hold Seated Thoracic Flexion and Rotation on Swiss Ball - 1 x daily - 7 x weekly - 10 reps - 3 sets

## 2019-05-28 NOTE — Therapy (Signed)
[facility] [address] [place], [place], [address] Phone: [phone]   Fax:  [phone]  Physical Therapy Treatment  Patient Details  Name: Lindsay [name] MRN: [id number] Date of Birth: [birth year] Referring Provider (PT): Bjorn Loser, MD   Encounter Date: 05/28/2019  PT End of Session - 05/28/19 1539    Visit Number  3    Date for PT Re-Evaluation  06/29/19    PT Start Time  1537   arrived late   PT Stop Time  1611    PT Time Calculation (min)  34 min    Activity Tolerance  Patient tolerated treatment well    Behavior During Therapy  [facility] for tasks assessed/performed       Past Medical History:  Diagnosis Date  . Arthritis   . Chest pain   . Heart murmur   . Herpes   . History of hepatitis    and mononucleosis  . Hypothyroidism   . Mononucleosis 1967  . Multinodular goiter   . Osteopenia   . Palpitations   . S/P AV nodal ablation 08/05/2011  . SVT (supraventricular tachycardia) ([place])   . Thyroid nodule     Past Surgical History:  Procedure Laterality Date  . BREAST CYST ASPIRATION Bilateral 12/15/2000  . CATARACT EXTRACTION W/ INTRAOCULAR LENS IMPLANT Bilateral 2014  . paragard iud     8/90  . SUPRAVENTRICULAR TACHYCARDIA ABLATION N/A 08/05/2011   Procedure: SUPRAVENTRICULAR TACHYCARDIA ABLATION;  Surgeon: Evans Lance, MD;  Location: [facility] CATH LAB;  Service: Cardiovascular;  Laterality: N/A;  . TONSILLECTOMY      There were no vitals filed for this visit.  Subjective Assessment - 05/28/19 1613    Subjective  Seems to be going in the right direction.  Pt states she has not leaked when coughing/sneezing and maybe a little less frequency    Pertinent History  3 pregnancies; severe Lt hip OA; hx or repetitive lifting    Patient Stated Goals  reduce the urge    Currently in Pain?  No/denies                       [facility] Adult PT Treatment/Exercise - 05/28/19 0001      Neuro  Re-ed    Neuro Re-ed Details   TrA and pelvic floor activation with VC throughout      Lumbar Exercises: Supine   Bridge with [employer]  15 reps    Large Ball Oblique Isometric Limitations  ball rollout 10x; ball roll out with ballsqueeze 10x      Lumbar Exercises: Prone   Straight Leg Raise  10 reps;3 seconds   kegel   Other Prone Lumbar Exercises  heel press - kegel 3 sec on/off -       Lumbar Exercises: Quadruped   Other Quadruped Lumbar Exercises  isometric kegel and transversus activation - hipIR to isolate             PT Education - 05/28/19 1612    Education Details  Access Code: [email or web address]    Person(s) Educated  Patient    Methods  Explanation;Demonstration;Verbal cues;Handout    Comprehension  Verbalized understanding;Returned demonstration       PT Short Term Goals - 05/28/19 2116      PT SHORT TERM GOAL #1   Title  ind with urge drills    Status  Achieved      PT SHORT TERM GOAL #2  Title  ind with initial HEP    Status  Achieved        PT Long Term Goals - 05/04/19 2140      PT LONG TERM GOAL #1   Title  ind wtih advanced HEP    Time  8    Period  Weeks    Status  New    Target Date  06/29/19      PT LONG TERM GOAL #2   Title  Pt able to control urge to void for at least 30 minutes in order to go out without worrying about if a restroom is close    Time  8    Period  Weeks    Status  New    Target Date  06/29/19      PT LONG TERM GOAL #3   Title  Pt will report no leakage due to improved endurance    Time  8    Period  Weeks    Status  New    Target Date  06/29/19      PT LONG TERM GOAL #4   Title  Pt will report she is using the restroom every 1.5-2 hours    Baseline  every 30 minutes    Time  8    Period  Weeks    Status  New    Target Date  06/29/19            Plan - 05/28/19 2057    Clinical Impression Statement  Pt was able to progress strength and got good isolation with quadruped position with hip IR.  Pt was able  to progress difficutly level and is making some progress.  She will reduce to biweekly due to finances.    PT Treatment/Interventions  ADLs/Self Care Home Management;Biofeedback;Cryotherapy;Electrical Stimulation;Moist Heat;Neuromuscular re-education;Therapeutic exercise;Therapeutic activities;Manual techniques;Taping;Patient/family education;Dry needling;Passive range of motion    PT Next Visit Plan  porgress strength and endurance as tolerated; hip rotation stretches, hip flexor stretches (as tolerated due to hip pain), thoracic mobility    PT Home Exercise Plan  Access Code: [email or web address]    Consulted and Agree with Plan of Care  Patient       Patient will benefit from skilled therapeutic intervention in order to improve the following deficits and impairments:  Decreased strength, Increased muscle spasms, Decreased endurance, Impaired flexibility  Visit Diagnosis: Muscle weakness (generalized)  Other lack of coordination     Problem List Patient Active Problem List   Diagnosis Date Noted  . High serum low-density lipoprotein (LDL) 11/30/2018  . Vitamin D insufficiency 11/30/2018  . High serum high density lipoprotein (HDL) 11/30/2018  . newer onset Exercise intolerance * 4-6 mo 08/15/2017  . Complaints of transient weakness of bilateral lower extremity 08/15/2017  . Transient episodes of numbness of both lower extremities 08/15/2017  . Atypical nevus of back 08/07/2017  . Reflux esophagitis 08/02/2017  . Persistent dry cough 08/02/2017  . Muscle spasm of lower bilateral back 08/02/2017  . Muscle spasms of neck 08/02/2017  . Hoarseness or changing voice 08/02/2017  . Left knee pain 06/30/2017  . Osteoarthritis of knee 06/20/2017  . Cough in adult 03/07/2017  . Acute maxillary sinusitis 03/07/2017  . Chronic pain of left knee 10/25/2016  . Primary osteoarthritis of both knees 10/18/2016  . Urinary incontinence 10/08/2016  . Chronic pain of both knees 10/08/2016  . Generalized  osteoarthritis of multiple sites 10/08/2016  . Vegan diet 08/27/2016  . Thyroid goiter 08/27/2016  .  h/o Near syncope 08/20/2013  . h/o chronic Dizziness 08/20/2013  . Pain in joint, shoulder region 09/26/2012  . Biceps tendinopathy 09/26/2012  . Right wrist pain 09/26/2012  . S/P AV nodal ablation 08/05/2011  . h/o Paroxysmal SVT (supraventricular tachycardia) ([place]) 10/06/2010  . Palpitations 08/26/2010  . Obesity 08/26/2010    Jule Ser, PT 05/28/2019, 9:16 PM  [facility] [address], [place], [place], [address] Phone: [phone]   Fax:  [phone]  Name: Lindsay [name] MRN: [id number] Date of Birth: Oct 04, 1945

## 2019-06-04 ENCOUNTER — Encounter: Payer: Medicare Other | Admitting: Physical Therapy

## 2019-06-08 ENCOUNTER — Ambulatory Visit
Admission: RE | Admit: 2019-06-08 | Discharge: 2019-06-08 | Disposition: A | Discharge status: Home / Self Care | Payer: Medicare Other | Source: Ambulatory Visit | Attending: Family Medicine | Admitting: Family Medicine

## 2019-06-08 ENCOUNTER — Other Ambulatory Visit: Payer: Self-pay

## 2019-06-08 DIAGNOSIS — Z13820 Encounter for screening for osteoporosis: Secondary | ICD-10-CM

## 2019-06-08 DIAGNOSIS — Z780 Asymptomatic menopausal state: Secondary | ICD-10-CM | POA: Diagnosis not present

## 2019-06-08 DIAGNOSIS — M85852 Other specified disorders of bone density and structure, left thigh: Secondary | ICD-10-CM | POA: Diagnosis not present

## 2019-06-11 ENCOUNTER — Ambulatory Visit: Payer: Medicare Other | Attending: Urology | Admitting: Physical Therapy

## 2019-06-11 ENCOUNTER — Other Ambulatory Visit: Payer: Self-pay

## 2019-06-11 DIAGNOSIS — R278 Other lack of coordination: Secondary | ICD-10-CM | POA: Diagnosis not present

## 2019-06-11 DIAGNOSIS — M6281 Muscle weakness (generalized): Secondary | ICD-10-CM | POA: Insufficient documentation

## 2019-06-11 NOTE — Patient Instructions (Signed)
Access Code: [email or web address] URL: [email or web address] Date: 06/11/2019 Prepared by: Jari Favre  Exercises Supine Hip Adductor Squeeze with Small Ball - 3 x daily - 7 x weekly - 10 reps - 1 sets - 3 sec hold Seated Pelvic Floor Contraction with Isometric Hip Adduction - 3 x daily - 7 x weekly - 10 reps - 1 sets - 3 sechold, 3 sec rest hold Seated Hamstring Stretch - 1 x daily - 7 x weekly - 3 reps - 1 sets - 30 sec hold Seated Figure 4 Piriformis Stretch - 1 x daily - 7 x weekly - 3 reps - 1 sets - 30 hold Quadruped Transversus Abdominis Bracing - 1 x daily - 7 x weekly - 2 sets - 10 reps - 5 sec hold Seated Thoracic Flexion and Rotation with Arms Crossed - 1 x daily - 7 x weekly - 10 reps - 1 sets - 5 sec hold Seated Thoracic Flexion and Rotation on Swiss Ball - 1 x daily - 7 x weekly - 10 reps - 3 sets Supine Bridge with Pelvic Floor Contraction - 1 x daily - 7 x weekly - 10 reps - 3 sets Sit to stand pelvic - blow as you go - 3 x daily - 7 x weekly - 10 reps - 1 sets

## 2019-06-11 NOTE — Therapy (Addendum)
[facility] [address] [place], [place], [address] Phone: [phone]   Fax:  [phone]  Physical Therapy Treatment  Patient Details  Name: Lindsay [name] MRN: [id number] Date of Birth: [birth year] Referring Provider (PT): Bjorn Loser, MD   Encounter Date: 06/11/2019  PT End of Session - 06/11/19 1537    Visit Number  4    Date for PT Re-Evaluation  06/29/19    PT Start Time  4650    PT Stop Time  1615    PT Time Calculation (min)  41 min    Activity Tolerance  Patient tolerated treatment well    Behavior During Therapy  [facility] for tasks assessed/performed       Past Medical History:  Diagnosis Date  . Arthritis   . Chest pain   . Heart murmur   . Herpes   . History of hepatitis    and mononucleosis  . Hypothyroidism   . Mononucleosis 1967  . Multinodular goiter   . Osteopenia   . Palpitations   . S/P AV nodal ablation 08/05/2011  . SVT (supraventricular tachycardia) ([place])   . Thyroid nodule     Past Surgical History:  Procedure Laterality Date  . BREAST CYST ASPIRATION Bilateral 12/15/2000  . CATARACT EXTRACTION W/ INTRAOCULAR LENS IMPLANT Bilateral 2014  . paragard iud     8/90  . SUPRAVENTRICULAR TACHYCARDIA ABLATION N/A 08/05/2011   Procedure: SUPRAVENTRICULAR TACHYCARDIA ABLATION;  Surgeon: Evans Lance, MD;  Location: [facility] CATH LAB;  Service: Cardiovascular;  Laterality: N/A;  . TONSILLECTOMY      There were no vitals filed for this visit.  Subjective Assessment - 06/11/19 1647    Subjective  Pt states things have remained the same since previous session.  States she had a lot of pain in her Lt hip after doing the exercises at the previous session for the day following.  No pain with exercises currently.    Pertinent History  3 pregnancies; severe Lt hip OA; hx or repetitive lifting    Patient Stated Goals  reduce the urge    Currently in Pain?  No/denies                        [facility] Adult PT Treatment/Exercise - 06/11/19 0001      Neuro Re-ed    Neuro Re-ed Details   biofeedback re-assessed - 18-52m resting tone; tactile cues to contract and relax      Lumbar Exercises: Seated   Sit to Stand  10 reps    Sit to Stand Limitations  eccentric control with sitting      Lumbar Exercises: Supine   AB Set Limitations  hooklying kegel ball squeeze and foam roll    Bridge  15 reps;2 seconds      Manual Therapy   Manual Therapy  Internal Pelvic Floor    Manual therapy comments  pt identity confirmed and informed consent given to perform internal soft tissue work    Internal Pelvic Floor  TP, coccygeus and levators bilaterally Rt>Lt; Lt side feels weaker against resistance             PT Education - 06/11/19 1648    Education Details  Access Code: [email or web address]    Person(s) Educated  Patient    Methods  Explanation;Demonstration;Verbal cues;Handout    Comprehension  Verbalized understanding;Returned demonstration       PT Short Term Goals - 05/28/19 2116  PT SHORT TERM GOAL #1   Title  ind with urge drills    Status  Achieved      PT SHORT TERM GOAL #2   Title  ind with initial HEP    Status  Achieved        PT Long Term Goals - 06/11/19 1643      PT LONG TERM GOAL #1   Title  ind wtih advanced HEP    Status  On-going      PT LONG TERM GOAL #2   Title  Pt able to control urge to void for at least 30 minutes in order to go out without worrying about if a restroom is close    Status  On-going      PT LONG TERM GOAL #3   Title  Pt will report no leakage due to improved endurance    Status  On-going      PT LONG TERM GOAL #4   Title  Pt will report she is using the restroom every 1.5-2 hours    Status  On-going            Plan - 06/11/19 1624    Clinical Impression Statement  Pt was progrressed to sitting and standing exercises.  She did not notice porgress with the exercises added previously and  all long term goals are still ongoing.  Pt able to contract and hold about 2 seconds before losing the contraction.  Improved resting tone after she was cued to contract then bulge.  Transverse peroneus muscle was very tight and Rt more than left.  Lt coccygeus and levators are weaker than Rt agains resistance.  Pt will benefit from skilled PT to continue progressing exercises for greater control and endurance.    PT Treatment/Interventions  ADLs/Self Care Home Management;Biofeedback;Cryotherapy;Electrical Stimulation;Moist Heat;Neuromuscular re-education;Therapeutic exercise;Therapeutic activities;Manual techniques;Taping;Patient/family education;Dry needling;Passive range of motion    PT Next Visit Plan  re-eval; f/u on sit to stand and eccentric exercises; gluteal strengthening    PT Home Exercise Plan  Access Code: [email or web address]    Consulted and Agree with Plan of Care  Patient       Patient will benefit from skilled therapeutic intervention in order to improve the following deficits and impairments:  Decreased strength, Increased muscle spasms, Decreased endurance, Impaired flexibility  Visit Diagnosis: Muscle weakness (generalized)  Other lack of coordination     Problem List Patient Active Problem List   Diagnosis Date Noted  . High serum low-density lipoprotein (LDL) 11/30/2018  . Vitamin D insufficiency 11/30/2018  . High serum high density lipoprotein (HDL) 11/30/2018  . newer onset Exercise intolerance * 4-6 mo 08/15/2017  . Complaints of transient weakness of bilateral lower extremity 08/15/2017  . Transient episodes of numbness of both lower extremities 08/15/2017  . Atypical nevus of back 08/07/2017  . Reflux esophagitis 08/02/2017  . Persistent dry cough 08/02/2017  . Muscle spasm of lower bilateral back 08/02/2017  . Muscle spasms of neck 08/02/2017  . Hoarseness or changing voice 08/02/2017  . Left knee pain 06/30/2017  . Osteoarthritis of knee 06/20/2017  . Cough in  adult 03/07/2017  . Acute maxillary sinusitis 03/07/2017  . Chronic pain of left knee 10/25/2016  . Primary osteoarthritis of both knees 10/18/2016  . Urinary incontinence 10/08/2016  . Chronic pain of both knees 10/08/2016  . Generalized osteoarthritis of multiple sites 10/08/2016  . Vegan diet 08/27/2016  . Thyroid goiter 08/27/2016  . h/o Near syncope 08/20/2013  . h/o chronic  Dizziness 08/20/2013  . Pain in joint, shoulder region 09/26/2012  . Biceps tendinopathy 09/26/2012  . Right wrist pain 09/26/2012  . S/P AV nodal ablation 08/05/2011  . h/o Paroxysmal SVT (supraventricular tachycardia) ([place]) 10/06/2010  . Palpitations 08/26/2010  . Obesity 08/26/2010    Jule Ser, PT 06/11/2019, 5:00 PM  [facility] [address], [place], [place], [address] Phone: [phone]   Fax:  [phone]  Name: LANEAH LUFT MRN: [id number] Date of Birth: Oct 14, 1945  PHYSICAL THERAPY DISCHARGE SUMMARY  Visits from Start of Care: 4  Current functional level related to goals / functional outcomes: See above   Remaining deficits: See above   Education / Equipment: HEP  Plan: Patient agrees to discharge.  Patient goals were not met. Patient is being discharged due to not returning since the last visit.  ?????     [employer], PT 07/23/19 2:58 PM

## 2019-06-18 ENCOUNTER — Encounter: Payer: Medicare Other | Admitting: Physical Therapy

## 2019-06-25 ENCOUNTER — Encounter: Payer: Medicare Other | Admitting: Physical Therapy

## 2019-07-09 ENCOUNTER — Ambulatory Visit: Payer: Medicare Other | Admitting: Physical Therapy

## 2019-07-20 DIAGNOSIS — H5213 Myopia, bilateral: Secondary | ICD-10-CM | POA: Diagnosis not present

## 2019-07-23 ENCOUNTER — Ambulatory Visit: Payer: Medicare Other | Admitting: Physical Therapy

## 2019-07-23 ENCOUNTER — Encounter: Payer: Medicare Other | Admitting: Physical Therapy

## 2019-07-23 DIAGNOSIS — Z0120 Encounter for dental examination and cleaning without abnormal findings: Secondary | ICD-10-CM | POA: Diagnosis not present

## 2019-08-06 ENCOUNTER — Ambulatory Visit: Payer: Self-pay | Admitting: Urology

## 2019-08-27 ENCOUNTER — Other Ambulatory Visit: Payer: Self-pay

## 2019-08-27 ENCOUNTER — Telehealth: Payer: Self-pay | Admitting: Urology

## 2019-08-27 MED ORDER — MIRABEGRON ER 50 MG PO TB24: 50.0000 mg | ORAL_TABLET | Freq: Every day | ORAL | 11 refills | Status: DC

## 2019-08-27 NOTE — Telephone Encounter (Signed)
Pt called stating she can't find her written Rx for Mybetriq, asks if it can be called in to [facility] in [place]. Please advise.

## 2019-08-30 IMAGING — US US THYROID
1 series · 14 of 25 positions shown · non-contrast
Comparison: 12/02/2016

CLINICAL DATA: Goiter.  Nontoxic goiter.

EXAM:
THYROID ULTRASOUND
TECHNIQUE: Ultrasound examination of the thyroid gland and adjacent soft
tissues was performed.

[Series 1: us thyroid · 0.05mm/px · 68 acquisitions, 14 frames shown]
[im 1/68]
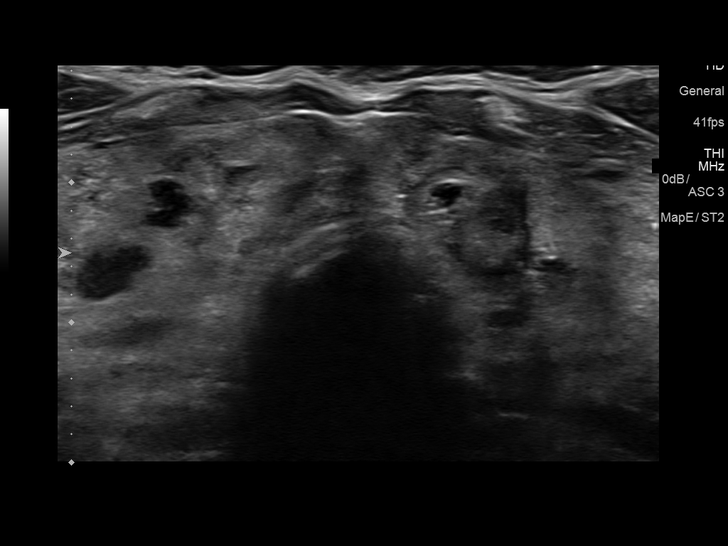
[im 6/68]
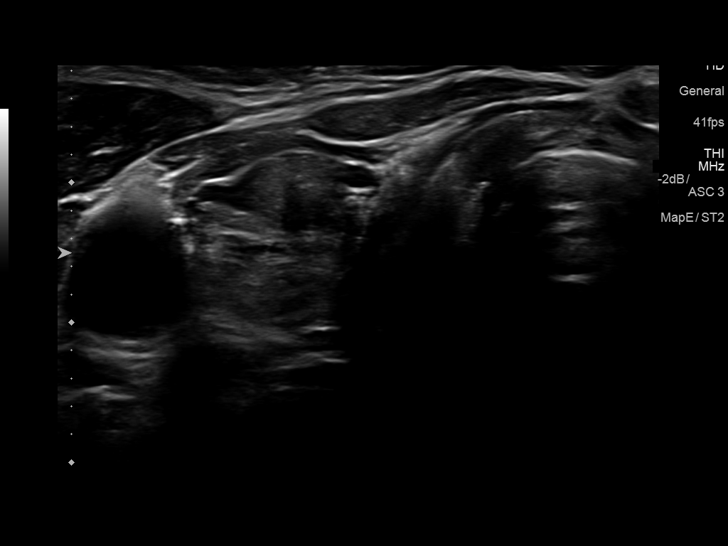
[im 12/68]
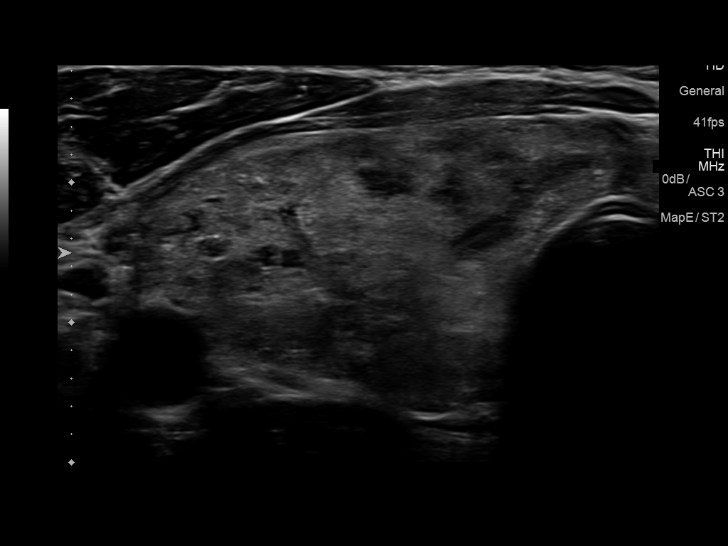
[im 17/68]
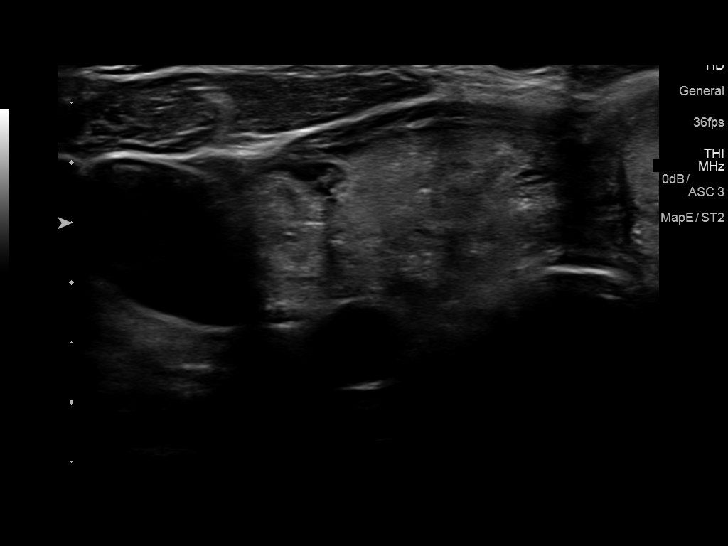
[im 23/68]
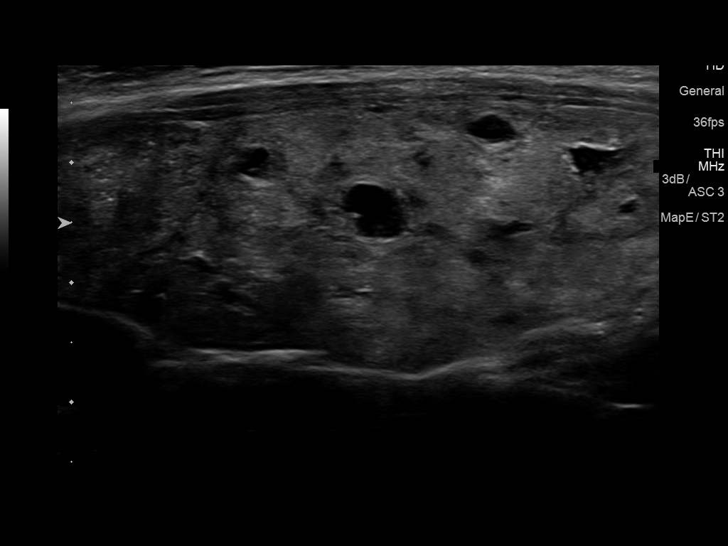
[im 26/68]
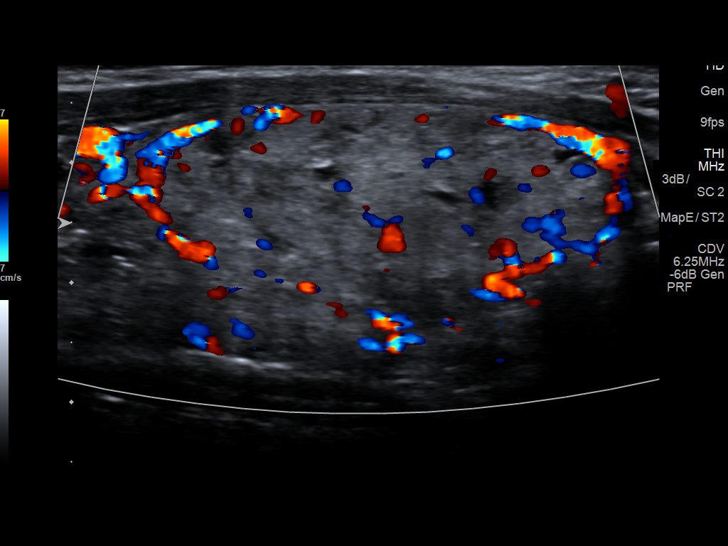
[im 31/68]
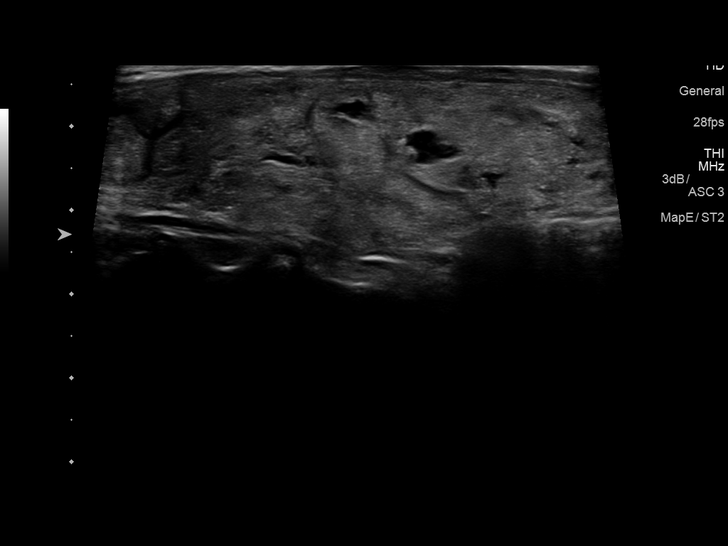
[im 37/68]
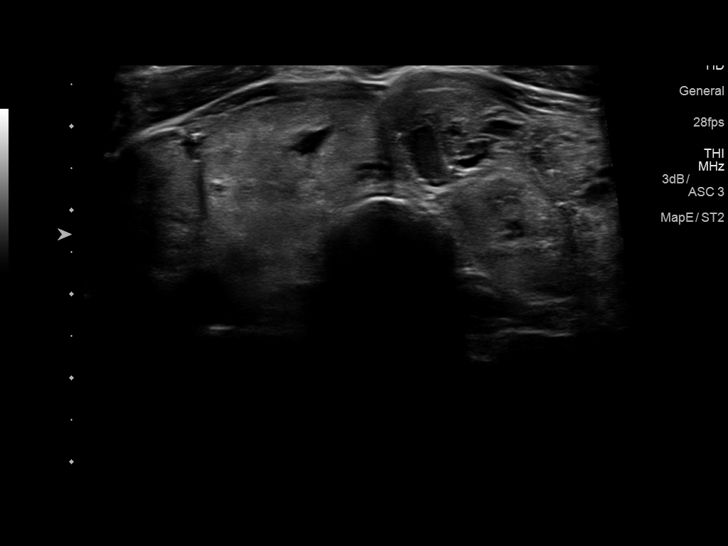
[im 42/68]
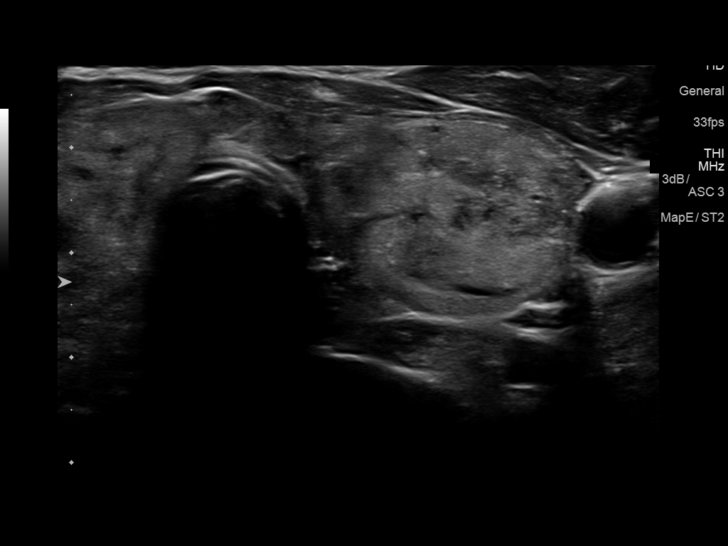
[im 45/68]
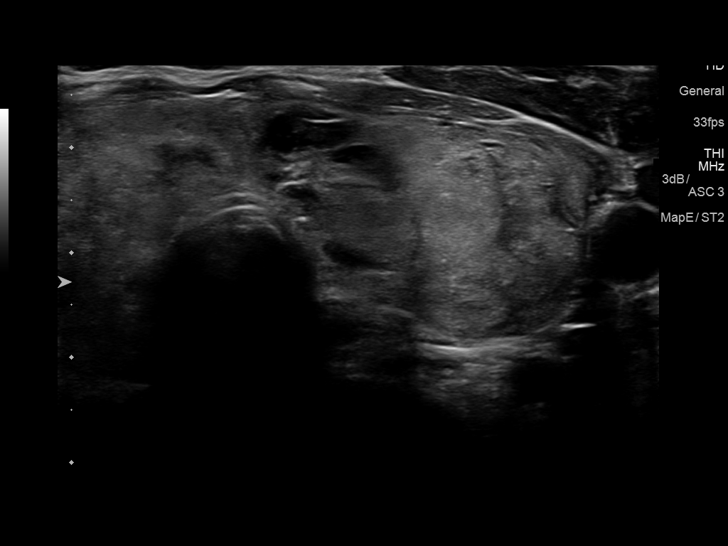
[im 51/68]
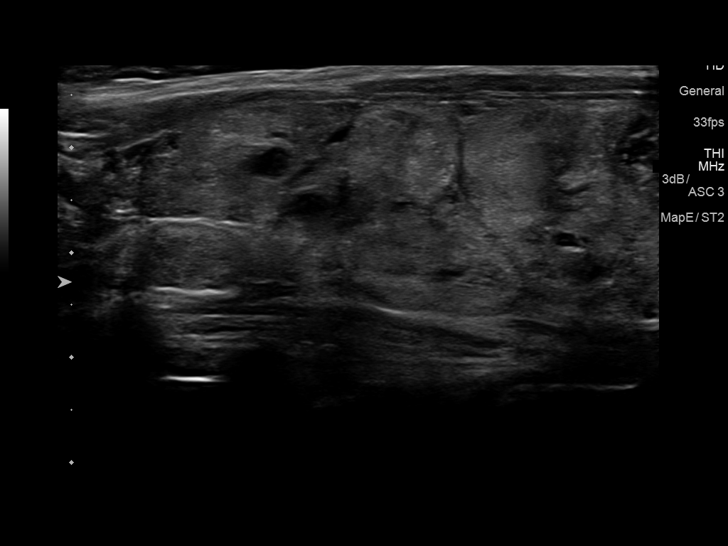
[im 56/68]
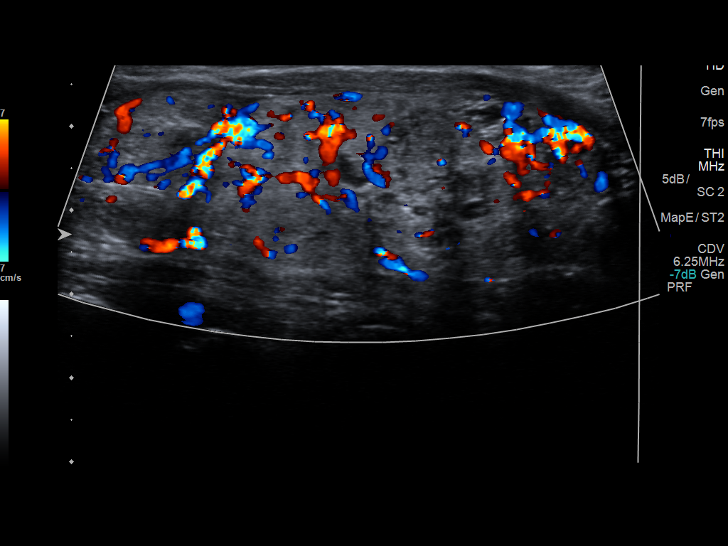
[im 62/68]
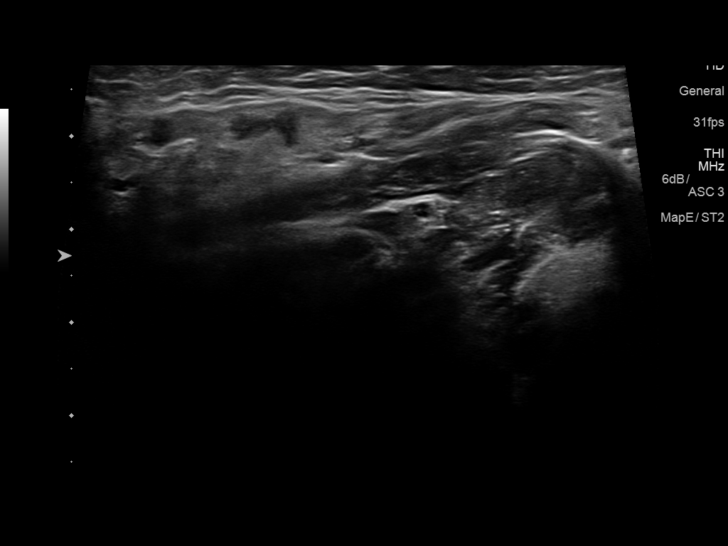
[im 68/68]
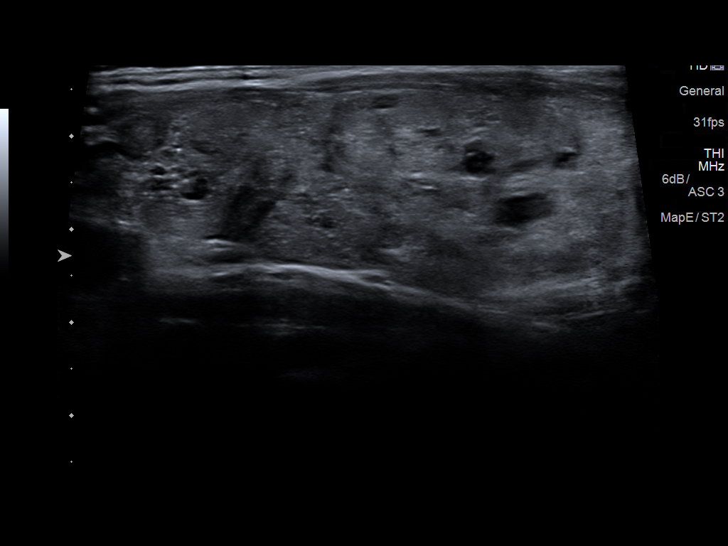

[14 of 25 positions shown; findings below may reference images not displayed]

FINDINGS: Parenchymal Echotexture: Markedly heterogenous

Isthmus: 1.1 cm, previously 1.3 cm

Right lobe: 6.6 x 2.7 x 2.7 cm, previously 7.0 x 2.5 x 3.2 cm

Left lobe: 6.8 x 2.4 x 2.8 cm, previously 6.6 x 2.3 x 3.1 cm

_________________________________________________________

Estimated total number of nodules >/= 1 cm: 0

Number of spongiform nodules >/=  2 cm not described below (TR1): 0

Number of mixed cystic and solid nodules >/= 1.5 cm not described
below (TR2): 0

_________________________________________________________

No discrete nodules are seen within the thyroid gland.
IMPRESSION: Heterogeneous gland without discrete nodule. This study does not
meet criteria for biopsy nor follow-up.

The above is in keeping with the ACR TI-RADS recommendations - [HOSPITAL] 8214;[DATE].

## 2019-09-24 ENCOUNTER — Other Ambulatory Visit: Payer: Self-pay

## 2019-09-24 ENCOUNTER — Ambulatory Visit: Payer: Medicare Other | Admitting: Physician Assistant

## 2019-09-24 ENCOUNTER — Encounter: Payer: Self-pay | Admitting: Physician Assistant

## 2019-09-24 VITALS — BP 105/65 | HR 67 | Temp 97.8°F | Ht 63.5 in | Wt 157.0 lb

## 2019-09-24 DIAGNOSIS — M546 Pain in thoracic spine: Secondary | ICD-10-CM

## 2019-09-24 DIAGNOSIS — M159 Polyosteoarthritis, unspecified: Secondary | ICD-10-CM

## 2019-09-24 DIAGNOSIS — M25552 Pain in left hip: Secondary | ICD-10-CM

## 2019-09-24 DIAGNOSIS — G8929 Other chronic pain: Secondary | ICD-10-CM

## 2019-09-24 DIAGNOSIS — M170 Bilateral primary osteoarthritis of knee: Secondary | ICD-10-CM

## 2019-09-24 NOTE — Patient Instructions (Signed)

## 2019-09-24 NOTE — Progress Notes (Signed)
Established Patient Office Visit  Subjective:  Patient ID: Lindsay [name], female    DOB: Dec 26, 1945  Age: 74 y.o. MRN: [id number]  CC:  Chief Complaint  Patient presents with  . Hip Pain    HPI Lindsay [name] presents for hip pain x years. Reports her pain initially would come and go but has been more consistently recently, especially with using stairs. States her mother had to have hip replacement due to arthritis and is concerned this may be her case. Reports she also has osteoarthritis in both her knees and had steroid injections in the past which significantly helped and believes it might be time for another injection. Also reports back pain, mostly upper back. Also reports soreness on left lower back. Patient takes 800 mg of ibuprofen once a day when needed which provides pain relief. Denies fever, chills, night sweats, or bowel incontinence. She is followed by urology for urinary incontinence.  Past Medical History:  Diagnosis Date  . Arthritis   . Chest pain   . Heart murmur   . Herpes   . History of hepatitis    and mononucleosis  . Hypothyroidism   . Mononucleosis 1967  . Multinodular goiter   . Osteopenia   . Palpitations   . S/P AV nodal ablation 08/05/2011  . SVT (supraventricular tachycardia) ([place])   . Thyroid nodule     Past Surgical History:  Procedure Laterality Date  . BREAST CYST ASPIRATION Bilateral 12/15/2000  . CATARACT EXTRACTION W/ INTRAOCULAR LENS IMPLANT Bilateral 2014  . paragard iud     8/90  . SUPRAVENTRICULAR TACHYCARDIA ABLATION N/A 08/05/2011   Procedure: SUPRAVENTRICULAR TACHYCARDIA ABLATION;  Surgeon: Evans Lance, MD;  Location: [facility] CATH LAB;  Service: Cardiovascular;  Laterality: N/A;  . TONSILLECTOMY      Family History  Problem Relation Age of Onset  . Dementia Mother 23       alive  . Transient ischemic attack Mother        hx of; has a pacemaker  . Hypothyroidism Mother   . Other Father 50       old age. Develop CHF the last  4 months  . Other Sister 62       died of thymoma  . Hypothyroidism Sister   . Other Maternal Aunt        nasal tumor  . Heart attack Maternal Grandfather   . Hypothyroidism Maternal Aunt   . Stroke Paternal Grandfather   . Colon cancer Neg Hx   . Esophageal cancer Neg Hx   . Pancreatic cancer Neg Hx   . Prostate cancer Neg Hx   . Rectal cancer Neg Hx   . Stomach cancer Neg Hx     Social History   Socioeconomic History  . Marital status: Divorced    Spouse name: Not on file  . Number of children: 2  . Years of education: Not on file  . Highest education level: Not on file  Occupational History  . Not on file  Tobacco Use  . Smoking status: Former Smoker    Types: Cigarettes    Quit date: 03/01/1970    Years since quitting: 49.6  . Smokeless tobacco: Never Used  Vaping Use  . Vaping Use: Never used  Substance and Sexual Activity  . Alcohol use: Yes    Alcohol/week: 0.0 standard drinks    Comment: occasional  . Drug use: No  . Sexual activity: Never    Birth control/protection: Post-menopausal  Other Topics Concern  . Not on file  Social History Narrative  . Not on file   Social Determinants of Health   Financial Resource Strain:   . Difficulty of Paying Living Expenses:   Food Insecurity:   . Worried About Charity fundraiser in the Last Year:   . Arboriculturist in the Last Year:   Transportation Needs:   . Film/video editor (Medical):   Marland Kitchen Lack of Transportation (Non-Medical):   Physical Activity:   . Days of Exercise per Week:   . Minutes of Exercise per Session:   Stress:   . Feeling of Stress :   Social Connections:   . Frequency of Communication with Friends and Family:   . Frequency of Social Gatherings with Friends and Family:   . Attends Religious Services:   . Active Member of Clubs or Organizations:   . Attends Archivist Meetings:   Marland Kitchen Marital Status:   Intimate Partner Violence:   . Fear of Current or Ex-Partner:   .  Emotionally Abused:   Marland Kitchen Physically Abused:   . Sexually Abused:     Outpatient Medications Prior to Visit  Medication Sig Dispense Refill  . aspirin EC 81 MG tablet Take 81 mg by mouth daily.    . Flaxseed, Linseed, (FLAX SEEDS PO) Take 30 mLs by mouth daily.    . folic acid (FOLVITE) 299 MCG tablet Take 400 mcg by mouth daily.    Marland Kitchen GINSENG PO Take by mouth daily.    Marland Kitchen levothyroxine (SYNTHROID, LEVOTHROID) 25 MCG tablet Take 1 tablet by mouth daily.  11  . mirabegron ER (MYRBETRIQ) 50 MG TB24 tablet Take 1 tablet (50 mg total) by mouth daily. 30 tablet 11  . Multiple Vitamin (MULTIVITAMIN) tablet Take 1 tablet by mouth daily.    . Probiotic Product (PROBIOTIC FORMULA PO) Take 1 capsule by mouth daily. New chapter All-Flora.    . TURMERIC PO Take by mouth daily.    . Vitamin D, Ergocalciferol, (DRISDOL) 1.25 MG (50000 UT) CAPS capsule Take one tablet wkly 12 capsule 3  . cholecalciferol (VITAMIN D) 1000 units tablet Take 2,000 Units by mouth daily.     Facility-Administered Medications Prior to Visit  Medication Dose Route Frequency Provider Last Rate Last Admin  . bupivacaine (MARCAINE) 0.5 % injection 2 mL  2 mL Infiltration Once Opalski, Deborah, DO      . lidocaine (XYLOCAINE) 2 % (with pres) injection 20 mg  1 mL Other Once Opalski, Deborah, DO      . methylPREDNISolone acetate (DEPO-MEDROL) injection 40 mg  40 mg Intra-articular Once Opalski, Deborah, DO        No Known Allergies  ROS Review of Systems Review of Systems:  A fourteen system review of systems was performed and found to be positive as per HPI.    Objective:    Physical Exam General:  Well Developed, well nourished, appropriate for stated age.  Neuro:  Alert and oriented,  extra-ocular muscles intact  HEENT:  Normocephalic, atraumatic, neck supple, no carotid bruits appreciated  Skin:  no gross rash, warm, pink. Cardiac:  RRR, S1 S2 Respiratory:  ECTA B/L, Not using accessory muscles, speaking in full  sentences- unlabored. MSK: TTP of thoracic area and left lower back. Good ROM, no bony deformities noted, normal gait. Vascular:  Ext warm, no cyanosis apprec.; cap RF less 2 sec. Psych:  No HI/SI, judgement and insight good, Euthymic mood. Full Affect.  BP  105/65   Pulse 67   Temp 97.8 F (36.6 C) (Oral)   Ht 5' 3.5" (1.613 m)   Wt 157 lb (71.2 kg)   SpO2 97% Comment: on RA  BMI 27.38 kg/m  Wt Readings from Last 3 Encounters:  09/24/19 157 lb (71.2 kg)  04/02/19 146 lb (66.2 kg)  12/07/18 149 lb (67.6 kg)     Health Maintenance Due  Topic Date Due  . COVID-19 Vaccine (1) Never done  . PNA vac Low Risk Adult (2 of 2 - PCV13) 06/23/2014    There are no preventive care reminders to display for this patient.  Lab Results  Component Value Date   TSH CANCELED 11/20/2018   Lab Results  Component Value Date   WBC 5.5 11/20/2018   HGB 12.6 11/20/2018   HCT 37.3 11/20/2018   MCV 94 11/20/2018   PLT 256 11/20/2018   Lab Results  Component Value Date   NA 139 11/20/2018   K 3.9 11/20/2018   CO2 25 11/20/2018   GLUCOSE 86 11/20/2018   BUN 12 11/20/2018   CREATININE 0.86 11/20/2018   BILITOT 0.6 11/20/2018   ALKPHOS 81 11/20/2018   AST 19 11/20/2018   ALT 22 11/20/2018   PROT 6.3 11/20/2018   ALBUMIN 3.9 11/20/2018   CALCIUM 9.0 11/20/2018   GFR 76.50 07/29/2011   Lab Results  Component Value Date   CHOL 199 11/20/2018   Lab Results  Component Value Date   HDL 76 11/20/2018   Lab Results  Component Value Date   LDLCALC 113 (H) 11/20/2018   Lab Results  Component Value Date   TRIG 52 11/20/2018   Lab Results  Component Value Date   CHOLHDL 2.6 11/20/2018   Lab Results  Component Value Date   HGBA1C CANCELED 11/20/2018      Assessment & Plan:   Problem List Items Addressed This Visit      Musculoskeletal and Integument   Generalized osteoarthritis of multiple sites - Primary   Relevant Orders   Ambulatory referral to Orthopedic Surgery   DG  Lumbar Spine Complete   Osteoarthritis of knee   Relevant Orders   Ambulatory referral to Orthopedic Surgery    Other Visit Diagnoses    Hip pain, chronic, left       Relevant Orders   Ambulatory referral to Orthopedic Surgery   DG Hip Unilat W OR W/O Pelvis 2-3 Views Left   Chronic thoracic back pain, unspecified back pain laterality       Relevant Orders   DG Lumbar Spine Complete     Generalized OA of multiple sites, OA of knee, chronic left hip pain, chronic thoracic back pain: -Placed orthopedic referral for further evaluation and management. -Placed imaging studies for lumbar x-ray and left hip x-ray. -Continue ibuprofen as needed for pain. (Last CMP: kidney function normal)  No orders of the defined types were placed in this encounter.   Follow-up: No follow-ups on file.    Lorrene Reid, PA-C

## 2019-10-01 ENCOUNTER — Ambulatory Visit (INDEPENDENT_AMBULATORY_CARE_PROVIDER_SITE_OTHER): Payer: Medicare Other | Admitting: Urology

## 2019-10-01 ENCOUNTER — Other Ambulatory Visit: Payer: Self-pay

## 2019-10-01 VITALS — BP 148/84 | HR 89 | Wt 157.0 lb

## 2019-10-01 DIAGNOSIS — N3941 Urge incontinence: Secondary | ICD-10-CM

## 2019-10-01 MED ORDER — SOLIFENACIN SUCCINATE 5 MG PO TABS: 5.0000 mg | ORAL_TABLET | Freq: Every day | ORAL | 11 refills | Status: DC

## 2019-10-01 MED ORDER — OXYBUTYNIN CHLORIDE ER 10 MG PO TB24: 10.0000 mg | ORAL_TABLET | Freq: Every day | ORAL | 11 refills | Status: DC

## 2019-10-01 NOTE — Progress Notes (Signed)
10/01/2019 2:42 PM   Lindsay [name] March 29, 1945 [id number]  Referring provider: Mellody Dance, [place] [address] [place],  [place] [address]  Chief Complaint  Patient presents with  . Urinary Incontinence    HPI: Patient has urgency incontinence worsening over many months.  She wears a pad for cough times and leaks that she is out in public.  She is bathroom mapping and again does not always leak.  No stress incontinence or bedwetting.  Voids every 30 to 60 minutes and cannot hold it for 2 hours.  Gets up 3-4 times a night  Narrow introitus.  Small grade 1 cystocele larger near the trigone but did not descend.  It was asymptomatic.  Mild hypermobility the bladder neck and negative cough test  Patient has mild overactive bladder and urgency incontinence.  Medical behavioral therapy and physical therapy discussed if we reach her treatment goal I likely would try to stop the medication in the future  We will see the physical therapist and see me in 4 months.  She took the Myrbetriq samples and prescription and she may or may not start them  Today Frequency stable.  Urge incontinence stable.  Clinically not infected.  Failed Myrbetriq and physical therapy but was not that compliant     PMH: Past Medical History:  Diagnosis Date  . Arthritis   . Chest pain   . Heart murmur   . Herpes   . History of hepatitis    and mononucleosis  . Hypothyroidism   . Mononucleosis 1967  . Multinodular goiter   . Osteopenia   . Palpitations   . S/P AV nodal ablation 08/05/2011  . SVT (supraventricular tachycardia) ([place])   . Thyroid nodule     Surgical History: Past Surgical History:  Procedure Laterality Date  . BREAST CYST ASPIRATION Bilateral 12/15/2000  . CATARACT EXTRACTION W/ INTRAOCULAR LENS IMPLANT Bilateral 2014  . paragard iud     8/90  . SUPRAVENTRICULAR TACHYCARDIA ABLATION N/A 08/05/2011   Procedure: SUPRAVENTRICULAR TACHYCARDIA ABLATION;  Surgeon: Evans Lance, MD;  Location: [facility] CATH LAB;  Service: Cardiovascular;  Laterality: N/A;  . TONSILLECTOMY      Home Medications:  Allergies as of 10/01/2019   No Known Allergies     Medication List       Accurate as of October 01, 2019  2:42 PM. If you have any questions, ask your nurse or doctor.        aspirin EC 81 MG tablet Take 81 mg by mouth daily.   FLAX SEEDS PO Take 30 mLs by mouth daily.   folic acid 621 MCG tablet Commonly known as: FOLVITE Take 400 mcg by mouth daily.   GINSENG PO Take by mouth daily.   levothyroxine 25 MCG tablet Commonly known as: SYNTHROID Take 1 tablet by mouth daily.   mirabegron ER 50 MG Tb24 tablet Commonly known as: MYRBETRIQ Take 1 tablet (50 mg total) by mouth daily.   multivitamin tablet Take 1 tablet by mouth daily.   PROBIOTIC FORMULA PO Take 1 capsule by mouth daily. New chapter All-Flora.   sodium fluoride 1.1 % Gel dental gel Commonly known as: FLUORISHIELD Place onto teeth.   TURMERIC PO Take by mouth daily.   Vitamin D (Ergocalciferol) 1.25 MG (50000 UNIT) Caps capsule Commonly known as: DRISDOL Take one tablet wkly       Allergies: No Known Allergies  Family History: Family History  Problem Relation Age of Onset  .  Dementia Mother 69       alive  . Transient ischemic attack Mother        hx of; has a pacemaker  . Hypothyroidism Mother   . Other Father 80       old age. Develop CHF the last 4 months  . Other Sister 43       died of thymoma  . Hypothyroidism Sister   . Other Maternal Aunt        nasal tumor  . Heart attack Maternal Grandfather   . Hypothyroidism Maternal Aunt   . Stroke Paternal Grandfather   . Colon cancer Neg Hx   . Esophageal cancer Neg Hx   . Pancreatic cancer Neg Hx   . Prostate cancer Neg Hx   . Rectal cancer Neg Hx   . Stomach cancer Neg Hx     Social History:  reports that she quit smoking about 49 years ago. Her smoking use included cigarettes. She has never used smokeless  tobacco. She reports current alcohol use. She reports that she does not use drugs.  ROS:                                        Physical Exam: BP (!) 148/84   Pulse 89   Wt 71.2 kg   BMI 27.38 kg/m   Constitutional:  Alert and oriented, No acute distress.  Laboratory Data: Lab Results  Component Value Date   WBC 5.5 11/20/2018   HGB 12.6 11/20/2018   HCT 37.3 11/20/2018   MCV 94 11/20/2018   PLT 256 11/20/2018    Lab Results  Component Value Date   CREATININE 0.86 11/20/2018    No results found for: PSA  No results found for: TESTOSTERONE  Lab Results  Component Value Date   HGBA1C CANCELED 11/20/2018    Urinalysis    Component Value Date/Time   COLORURINE YELLOW 08/20/2013 1030   APPEARANCEUR Clear 04/02/2019 1336   LABSPEC 1.020 08/20/2013 1030   PHURINE 6.5 08/20/2013 1030   GLUCOSEU Negative 04/02/2019 1336   HGBUR NEGATIVE 08/20/2013 1030   BILIRUBINUR Negative 04/02/2019 1336   KETONESUR 15 (A) 08/20/2013 1030   PROTEINUR Negative 04/02/2019 1336   PROTEINUR NEGATIVE 08/20/2013 1030   UROBILINOGEN 0.2 08/20/2013 1030   NITRITE Negative 04/02/2019 1336   NITRITE NEGATIVE 08/20/2013 1030   LEUKOCYTESUR Negative 04/02/2019 1336    Pertinent Imaging:   Assessment & Plan: Gave prescription of oxybutynin ER 10 mg and Vesicare 5 mg and see in 8 weeks.  She has never had urodynamics or cystoscopy.  There are no diagnoses linked to this encounter.  No follow-ups on file.  Reece Packer, MD  [facility] [address] [name], [place] [address] [phone]

## 2019-10-05 ENCOUNTER — Ambulatory Visit: Payer: Medicare Other | Admitting: Physician Assistant

## 2019-10-05 ENCOUNTER — Ambulatory Visit: Payer: Self-pay

## 2019-10-05 ENCOUNTER — Other Ambulatory Visit: Payer: Self-pay

## 2019-10-05 ENCOUNTER — Telehealth: Payer: Self-pay

## 2019-10-05 ENCOUNTER — Encounter: Payer: Self-pay | Admitting: Physician Assistant

## 2019-10-05 DIAGNOSIS — M25552 Pain in left hip: Secondary | ICD-10-CM

## 2019-10-05 DIAGNOSIS — M25562 Pain in left knee: Secondary | ICD-10-CM

## 2019-10-05 DIAGNOSIS — M25561 Pain in right knee: Secondary | ICD-10-CM | POA: Diagnosis not present

## 2019-10-05 DIAGNOSIS — G8929 Other chronic pain: Secondary | ICD-10-CM

## 2019-10-05 MED ORDER — METHYLPREDNISOLONE ACETATE 40 MG/ML IJ SUSP
40.0000 mg | INTRAMUSCULAR | Status: AC | PRN
  Administered 2019-10-05: 40 mg via INTRA_ARTICULAR

## 2019-10-05 MED ORDER — LIDOCAINE HCL 1 % IJ SOLN
5.0000 mL | INTRAMUSCULAR | Status: AC | PRN
  Administered 2019-10-05: 5 mL

## 2019-10-05 NOTE — Telephone Encounter (Signed)
PA request sent from pharmacy states that Lindsay [name] is not on formulary. I see patient was to try oxybutinin and then vesicare and follow up in 8wks. Oxybutinin is on formulary as well as Tolterodine, Trospium, and Myrbetriq.

## 2019-10-05 NOTE — Progress Notes (Signed)
Office Visit Note   Patient: Lindsay [name]           Date of Birth: 11-19-1945           MRN: [id number] Visit Date: 10/05/2019              Requested by: Lorrene Reid, PA-C [place],  [place] [id number] PCP: Lorrene Reid, PA-C  Chief Complaint  Patient presents with  . Left Knee - Pain  . Right Knee - Pain  . Left Hip - Pain      HPI: Patient is a pleasant 74 year old woman with a history of bilateral knee arthritis.  Her last injections were a year ago through her primary care physician.  She has done quite well with these but her primary care practice does not have an available provider that we will do these.  She also is complaining of longstanding left hip pain she.  She notices it after she has been sitting a while she points to her groin and has pain with internal rotation of her hip she is wondering about injections for her hip  Assessment & Plan: Visit Diagnoses:  1. Pain in left hip   2. Chronic pain of left knee   3. Chronic pain of right knee     Plan: Bilateral knees were injected today.  She is going to make an appointment with Dr. Junius Roads for an ultrasound-guided injection into her left hip at her convenience.  May follow-up with [place] as needed  Follow-Up Instructions: No follow-ups on file.   Ortho Exam  Patient is alert, oriented, no adenopathy, well-dressed, normal affect, normal respiratory effort. Bilateral knees no effusion mild soft tissue swelling no cellulitis she does have some patellar grinding with range of motion. Left hip she does have impingement findings with internal rotation.  She does have decreased range of motion.  Range of motion at extremes is painful  Imaging: XR HIP UNILAT W OR W/O PELVIS 1V LEFT  Result Date: 10/05/2019 X-rays of her left hip demonstrate well-maintained alignment.  She does have some loss of joint space in the left hip.  No acute osseous changes  XR Knee 1-2 Views Left  Result Date:  10/05/2019 2 views of the left knee demonstrate well-maintained alignment some joint space narrowing especially with the patellofemoral joint  XR Knee 1-2 Views Right  Result Date: 10/05/2019 X-rays of her knee demonstrate well-maintained alignment she has some degeneration of the patellofemoral joint minimal loss of joint space otherwise  No images are attached to the encounter.  Labs: Lab Results  Component Value Date   HGBA1C CANCELED 11/20/2018   HGBA1C 5.5 09/03/2016   REPTSTATUS 08/21/2013 FINAL 08/20/2013   CULT  08/20/2013    Multiple bacterial morphotypes present, none predominant. Suggest appropriate recollection if clinically indicated. Performed at [facility]     Lab Results  Component Value Date   ALBUMIN 3.9 11/20/2018   ALBUMIN 4.3 09/03/2016   ALBUMIN 4.1 09/30/2014    No results found for: MG Lab Results  Component Value Date   VD25OH 49.5 05/11/2019   VD25OH 37.2 11/20/2018   VD25OH 38.6 09/03/2016    No results found for: PREALBUMIN CBC EXTENDED Latest Ref Rng & Units 11/20/2018 09/03/2016 08/20/2013  WBC 3.4 - 10.8 x10E3/uL 5.5 4.8 4.4  RBC 3.77 - 5.28 x10E6/uL 3.98 4.25 3.66(L)  HGB 11.1 - 15.9 g/dL 12.6 12.9 11.3(L)  HCT 34.0 - 46.6 % 37.3 39.0 34.2(L)  PLT 150 - 450 x10E3/uL 256 248 161  NEUTROABS 1 - 7 x10E3/uL 2.7 2.5 -  LYMPHSABS 0 - 3 x10E3/uL 1.8 1.5 -     There is no height or weight on file to calculate BMI.  Orders:  Orders Placed This Encounter  Procedures  . XR HIP UNILAT W OR W/O PELVIS 1V LEFT  . XR Knee 1-2 Views Left  . XR Knee 1-2 Views Right   No orders of the defined types were placed in this encounter.    Procedures: Large Joint Inj: bilateral knee on 10/05/2019 10:27 AM Indications: pain and diagnostic evaluation Details: 22 G 1.5 in needle  Arthrogram: No  Medications (Right): 40 mg methylPREDNISolone acetate 40 MG/ML; 5 mL lidocaine 1 % Medications (Left): 40 mg methylPREDNISolone acetate 40 MG/ML; 5 mL  lidocaine 1 % Outcome: tolerated well, no immediate complications Procedure, treatment alternatives, risks and benefits explained, specific risks discussed. Consent was given by the patient. Immediately prior to procedure a time out was called to verify the correct patient, procedure, equipment, support staff and site/side marked as required. Patient was prepped and draped in the usual sterile fashion.      Clinical Data: No additional findings.  ROS:  All other systems negative, except as noted in the HPI. Review of Systems  Objective: Vital Signs: There were no vitals taken for this visit.  Specialty Comments:  No specialty comments available.  PMFS History: Patient Active Problem List   Diagnosis Date Noted  . High serum low-density lipoprotein (LDL) 11/30/2018  . Vitamin D insufficiency 11/30/2018  . High serum high density lipoprotein (HDL) 11/30/2018  . newer onset Exercise intolerance * 4-6 mo 08/15/2017  . Complaints of transient weakness of bilateral lower extremity 08/15/2017  . Transient episodes of numbness of both lower extremities 08/15/2017  . Atypical nevus of back 08/07/2017  . Reflux esophagitis 08/02/2017  . Persistent dry cough 08/02/2017  . Muscle spasm of lower bilateral back 08/02/2017  . Muscle spasms of neck 08/02/2017  . Hoarseness or changing voice 08/02/2017  . Left knee pain 06/30/2017  . Osteoarthritis of knee 06/20/2017  . Cough in adult 03/07/2017  . Acute maxillary sinusitis 03/07/2017  . Chronic pain of left knee 10/25/2016  . Primary osteoarthritis of both knees 10/18/2016  . Urinary incontinence 10/08/2016  . Chronic pain of both knees 10/08/2016  . Generalized osteoarthritis of multiple sites 10/08/2016  . Vegan diet 08/27/2016  . Thyroid goiter 08/27/2016  . h/o Near syncope 08/20/2013  . h/o chronic Dizziness 08/20/2013  . Pain in joint, shoulder region 09/26/2012  . Biceps tendinopathy 09/26/2012  . Right wrist pain 09/26/2012   . S/P AV nodal ablation 08/05/2011  . h/o Paroxysmal SVT (supraventricular tachycardia) ([place]) 10/06/2010  . Palpitations 08/26/2010  . Obesity 08/26/2010   Past Medical History:  Diagnosis Date  . Arthritis   . Chest pain   . Heart murmur   . Herpes   . History of hepatitis    and mononucleosis  . Hypothyroidism   . Mononucleosis 1967  . Multinodular goiter   . Osteopenia   . Palpitations   . S/P AV nodal ablation 08/05/2011  . SVT (supraventricular tachycardia) ([place])   . Thyroid nodule     Family History  Problem Relation Age of Onset  . Dementia Mother 41       alive  . Transient ischemic attack Mother        hx of; has a pacemaker  . Hypothyroidism Mother   .  Other Father 98       old age. Develop CHF the last 4 months  . Other Sister 3       died of thymoma  . Hypothyroidism Sister   . Other Maternal Aunt        nasal tumor  . Heart attack Maternal Grandfather   . Hypothyroidism Maternal Aunt   . Stroke Paternal Grandfather   . Colon cancer Neg Hx   . Esophageal cancer Neg Hx   . Pancreatic cancer Neg Hx   . Prostate cancer Neg Hx   . Rectal cancer Neg Hx   . Stomach cancer Neg Hx     Past Surgical History:  Procedure Laterality Date  . BREAST CYST ASPIRATION Bilateral 12/15/2000  . CATARACT EXTRACTION W/ INTRAOCULAR LENS IMPLANT Bilateral 2014  . paragard iud     8/90  . SUPRAVENTRICULAR TACHYCARDIA ABLATION N/A 08/05/2011   Procedure: SUPRAVENTRICULAR TACHYCARDIA ABLATION;  Surgeon: Evans Lance, MD;  Location: [facility] CATH LAB;  Service: Cardiovascular;  Laterality: N/A;  . TONSILLECTOMY     Social History   Occupational History  . Not on file  Tobacco Use  . Smoking status: Former Smoker    Types: Cigarettes    Quit date: 03/01/1970    Years since quitting: 49.6  . Smokeless tobacco: Never Used  Vaping Use  . Vaping Use: Never used  Substance and Sexual Activity  . Alcohol use: Yes    Alcohol/week: 0.0 standard drinks    Comment: occasional    . Drug use: No  . Sexual activity: Never    Birth control/protection: Post-menopausal

## 2019-10-08 NOTE — Telephone Encounter (Signed)
Left pt mess to call 

## 2019-10-08 NOTE — Telephone Encounter (Signed)
If the oxybutynin does not help then please try Detrol LA 4 mg 30x11 thank you

## 2019-10-12 ENCOUNTER — Encounter: Payer: Self-pay | Admitting: Family Medicine

## 2019-10-12 ENCOUNTER — Ambulatory Visit: Payer: Medicare Other | Admitting: Family Medicine

## 2019-10-12 ENCOUNTER — Other Ambulatory Visit: Payer: Self-pay

## 2019-10-12 ENCOUNTER — Ambulatory Visit: Payer: Self-pay

## 2019-10-12 DIAGNOSIS — M25552 Pain in left hip: Secondary | ICD-10-CM

## 2019-10-12 NOTE — Progress Notes (Signed)
Subjective: Patient is here for ultrasound-guided intra-articular left hip injection.   Chronic groin pain due to DJD.  Objective: Pain and stiffness with passive internal rotation.  Procedure: Ultrasound-guided left hip injection: After sterile prep with Betadine, injected 8 cc 1% lidocaine without epinephrine and 40 mg methylprednisolone using a 22-gauge spinal needle, passing the needle through the iliofemoral ligament into the femoral head/neck junction.  Injectate seen filling joint capsule.  Good immediate relief.

## 2019-10-17 NOTE — Telephone Encounter (Signed)
Left pt mess to call 

## 2019-10-18 NOTE — Telephone Encounter (Signed)
Patient notified she will take oxybutinin and if it does not help with her urinary symptoms she will call back and have detrol sent in

## 2019-10-29 ENCOUNTER — Other Ambulatory Visit: Payer: Self-pay | Admitting: Physician Assistant

## 2019-10-29 DIAGNOSIS — E559 Vitamin D deficiency, unspecified: Secondary | ICD-10-CM

## 2019-10-29 MED ORDER — VITAMIN D (ERGOCALCIFEROL) 1.25 MG (50000 UNIT) PO CAPS: ORAL_CAPSULE | ORAL | 0 refills | Status: DC

## 2019-11-06 ENCOUNTER — Ambulatory Visit: Payer: Self-pay

## 2019-11-06 ENCOUNTER — Encounter: Payer: Self-pay | Admitting: Family

## 2019-11-06 ENCOUNTER — Ambulatory Visit: Payer: Medicare Other | Admitting: Family

## 2019-11-06 VITALS — Ht 63.0 in | Wt 157.0 lb

## 2019-11-06 DIAGNOSIS — S92355A Nondisplaced fracture of fifth metatarsal bone, left foot, initial encounter for closed fracture: Secondary | ICD-10-CM

## 2019-11-06 DIAGNOSIS — M79672 Pain in left foot: Secondary | ICD-10-CM | POA: Diagnosis not present

## 2019-11-07 ENCOUNTER — Encounter: Payer: Self-pay | Admitting: Family

## 2019-11-07 NOTE — Progress Notes (Signed)
Office Visit Note   Patient: Lindsay [name]           Date of Birth: [birth year]           MRN: [id number] Visit Date: 11/06/2019              Requested by: Lorrene Reid, PA-C [place],  [place] [address] PCP: Lorrene Reid, PA-C  Chief Complaint  Patient presents with  . Left Foot - Injury    Twist going don steps about 10 days ago.       HPI: The patient is a 74 year old woman who presents here for initial evaluation of left foot pain following twisting and fall injury.  She is unsure of the exact mechanism of injury but has had pain over the dorsal lateral foot since with some bruising of her third fourth and fifth toes pain with ambulation.  Is able to weight-bear no numbness no tingling some swelling  Assessment & Plan: Visit Diagnoses:  1. Pain in left foot     Plan: We will place her in a postop shoe.  She will minimize her weightbearing for the next 2 weeks.  Follow-up in 2 weeks with repeat radiographs of the foot  Follow-Up Instructions: No follow-ups on file.   Ortho Exam  Patient is alert, oriented, no adenopathy, well-dressed, normal affect, normal respiratory effort. On examination of the right foot there is minimal swelling.  She does have a palpable dorsalis pedis pulse.  There are some ecchymosis over the third fourth and fifth toes.  She is tender over the dorsum of her foot specifically over the base of the fourth and fifth metatarsals tender to the base of the fifth.  Imaging: No results found. No images are attached to the encounter.  Labs: Lab Results  Component Value Date   HGBA1C CANCELED 11/20/2018   HGBA1C 5.5 09/03/2016   REPTSTATUS 08/21/2013 FINAL 08/20/2013   CULT  08/20/2013    Multiple bacterial morphotypes present, none predominant. Suggest appropriate recollection if clinically indicated. Performed at [facility]     Lab Results  Component Value Date   ALBUMIN 3.9 11/20/2018   ALBUMIN 4.3  09/03/2016   ALBUMIN 4.1 09/30/2014    No results found for: MG Lab Results  Component Value Date   VD25OH 49.5 05/11/2019   VD25OH 37.2 11/20/2018   VD25OH 38.6 09/03/2016    No results found for: PREALBUMIN CBC EXTENDED Latest Ref Rng & Units 11/20/2018 09/03/2016 08/20/2013  WBC 3.4 - 10.8 x10E3/uL 5.5 4.8 4.4  RBC 3.77 - 5.28 x10E6/uL 3.98 4.25 3.66(L)  HGB 11.1 - 15.9 g/dL 12.6 12.9 11.3(L)  HCT 34.0 - 46.6 % 37.3 39.0 34.2(L)  PLT 150 - 450 x10E3/uL 256 248 161  NEUTROABS 1 - 7 x10E3/uL 2.7 2.5 -  LYMPHSABS 0 - 3 x10E3/uL 1.8 1.5 -     Body mass index is 27.81 kg/m.  Orders:  Orders Placed This Encounter  Procedures  . XR Foot 2 Views Left   No orders of the defined types were placed in this encounter.    Procedures: No procedures performed  Clinical Data: No additional findings.  ROS:  All other systems negative, except as noted in the HPI. Review of Systems  Constitutional: Negative for chills and fever.  Musculoskeletal: Positive for arthralgias, gait problem and myalgias.  Skin: Positive for color change.    Objective: Vital Signs: Ht 5\' 3"  (1.6 m)   Wt 157 lb (71.2  kg)   BMI 27.81 kg/m   Specialty Comments:  No specialty comments available.  PMFS History: Patient Active Problem List   Diagnosis Date Noted  . High serum low-density lipoprotein (LDL) 11/30/2018  . Vitamin D insufficiency 11/30/2018  . High serum high density lipoprotein (HDL) 11/30/2018  . newer onset Exercise intolerance * 4-6 mo 08/15/2017  . Complaints of transient weakness of bilateral lower extremity 08/15/2017  . Transient episodes of numbness of both lower extremities 08/15/2017  . Atypical nevus of back 08/07/2017  . Reflux esophagitis 08/02/2017  . Persistent dry cough 08/02/2017  . Muscle spasm of lower bilateral back 08/02/2017  . Muscle spasms of neck 08/02/2017  . Hoarseness or changing voice 08/02/2017  . Left knee pain 06/30/2017  . Osteoarthritis of knee  06/20/2017  . Cough in adult 03/07/2017  . Acute maxillary sinusitis 03/07/2017  . Chronic pain of left knee 10/25/2016  . Primary osteoarthritis of both knees 10/18/2016  . Urinary incontinence 10/08/2016  . Chronic pain of both knees 10/08/2016  . Generalized osteoarthritis of multiple sites 10/08/2016  . Vegan diet 08/27/2016  . Thyroid goiter 08/27/2016  . h/o Near syncope 08/20/2013  . h/o chronic Dizziness 08/20/2013  . Pain in joint, shoulder region 09/26/2012  . Biceps tendinopathy 09/26/2012  . Right wrist pain 09/26/2012  . S/P AV nodal ablation 08/05/2011  . h/o Paroxysmal SVT (supraventricular tachycardia) ([place]) 10/06/2010  . Palpitations 08/26/2010  . Obesity 08/26/2010   Past Medical History:  Diagnosis Date  . Arthritis   . Chest pain   . Heart murmur   . Herpes   . History of hepatitis    and mononucleosis  . Hypothyroidism   . Mononucleosis 1967  . Multinodular goiter   . Osteopenia   . Palpitations   . S/P AV nodal ablation 08/05/2011  . SVT (supraventricular tachycardia) ([place])   . Thyroid nodule     Family History  Problem Relation Age of Onset  . Dementia Mother 64       alive  . Transient ischemic attack Mother        hx of; has a pacemaker  . Hypothyroidism Mother   . Other Father 62       old age. Develop CHF the last 4 months  . Other Sister 73       died of thymoma  . Hypothyroidism Sister   . Other Maternal Aunt        nasal tumor  . Heart attack Maternal Grandfather   . Hypothyroidism Maternal Aunt   . Stroke Paternal Grandfather   . Colon cancer Neg Hx   . Esophageal cancer Neg Hx   . Pancreatic cancer Neg Hx   . Prostate cancer Neg Hx   . Rectal cancer Neg Hx   . Stomach cancer Neg Hx     Past Surgical History:  Procedure Laterality Date  . BREAST CYST ASPIRATION Bilateral 12/15/2000  . CATARACT EXTRACTION W/ INTRAOCULAR LENS IMPLANT Bilateral 2014  . paragard iud     8/90  . SUPRAVENTRICULAR TACHYCARDIA ABLATION N/A  08/05/2011   Procedure: SUPRAVENTRICULAR TACHYCARDIA ABLATION;  Surgeon: Evans Lance, MD;  Location: [facility] CATH LAB;  Service: Cardiovascular;  Laterality: N/A;  . TONSILLECTOMY     Social History   Occupational History  . Not on file  Tobacco Use  . Smoking status: Former Smoker    Types: Cigarettes    Quit date: 03/01/1970    Years since quitting: 49.7  . Smokeless  tobacco: Never Used  Vaping Use  . Vaping Use: Never used  Substance and Sexual Activity  . Alcohol use: Yes    Alcohol/week: 0.0 standard drinks    Comment: occasional  . Drug use: No  . Sexual activity: Never    Birth control/protection: Post-menopausal

## 2019-11-09 ENCOUNTER — Ambulatory Visit
Admission: RE | Admit: 2019-11-09 | Discharge: 2019-11-09 | Disposition: A | Discharge status: Home / Self Care | Payer: Medicare Other | Source: Ambulatory Visit | Attending: Endocrinology | Admitting: Endocrinology

## 2019-11-09 DIAGNOSIS — E049 Nontoxic goiter, unspecified: Secondary | ICD-10-CM | POA: Diagnosis not present

## 2019-11-23 ENCOUNTER — Encounter: Payer: Self-pay | Admitting: Family

## 2019-11-23 ENCOUNTER — Ambulatory Visit: Payer: Medicare Other | Admitting: Family

## 2019-11-23 ENCOUNTER — Ambulatory Visit: Payer: Self-pay

## 2019-11-23 VITALS — Ht 63.0 in | Wt 157.0 lb

## 2019-11-23 DIAGNOSIS — S92355A Nondisplaced fracture of fifth metatarsal bone, left foot, initial encounter for closed fracture: Secondary | ICD-10-CM | POA: Diagnosis not present

## 2019-11-23 MED ORDER — IBUPROFEN 800 MG PO TABS: 800.0000 mg | ORAL_TABLET | Freq: Three times a day (TID) | ORAL | 0 refills | Status: DC | PRN

## 2019-11-23 NOTE — Progress Notes (Signed)
Office Visit Note   Patient: Lindsay [name]           Date of Birth: 06-24-1945           MRN: [id number] Visit Date: 11/23/2019              Requested by: Lorrene Reid, PA-C [place],  [place] [id number] PCP: Lorrene Reid, PA-C  Chief Complaint  Patient presents with  . Left Foot - Follow-up      HPI: The patient is a 74 year old woman who presents in follow up for fracture to fifth metatarsal left foot.  She has been in a postop shoe.  She is about 3 and half weeks out from her injury.  She is feeling better does have aching and pain that increases over the course of the day.   Assessment & Plan: Visit Diagnoses:  1. Closed nondisplaced fracture of fifth metatarsal bone of left foot, initial encounter     Plan: Continue postop shoe.  Follow-up once more in 2 weeks.  Anticipate advancing to regular shoewear at that time  Follow-Up Instructions: No follow-ups on file.   Ortho Exam  Patient is alert, oriented, no adenopathy, well-dressed, normal affect, normal respiratory effort. On examination of the right foot there is minimal swelling.  She is tender over the dorsum of her foot specifically over the base of the fourth and fifth metatarsals tender to the base of the fifth.  Imaging: No results found. No images are attached to the encounter.  Labs: Lab Results  Component Value Date   HGBA1C CANCELED 11/20/2018   HGBA1C 5.5 09/03/2016   REPTSTATUS 08/21/2013 FINAL 08/20/2013   CULT  08/20/2013    Multiple bacterial morphotypes present, none predominant. Suggest appropriate recollection if clinically indicated. Performed at [facility]     Lab Results  Component Value Date   ALBUMIN 3.9 11/20/2018   ALBUMIN 4.3 09/03/2016   ALBUMIN 4.1 09/30/2014    No results found for: MG Lab Results  Component Value Date   VD25OH 49.5 05/11/2019   VD25OH 37.2 11/20/2018   VD25OH 38.6 09/03/2016    No results found for:  PREALBUMIN CBC EXTENDED Latest Ref Rng & Units 11/20/2018 09/03/2016 08/20/2013  WBC 3.4 - 10.8 x10E3/uL 5.5 4.8 4.4  RBC 3.77 - 5.28 x10E6/uL 3.98 4.25 3.66(L)  HGB 11.1 - 15.9 g/dL 12.6 12.9 11.3(L)  HCT 34.0 - 46.6 % 37.3 39.0 34.2(L)  PLT 150 - 450 x10E3/uL 256 248 161  NEUTROABS 1 - 7 x10E3/uL 2.7 2.5 -  LYMPHSABS 0 - 3 x10E3/uL 1.8 1.5 -     Body mass index is 27.81 kg/m.  Orders:  Orders Placed This Encounter  Procedures  . XR Foot Complete Left   Meds ordered this encounter  Medications  . ibuprofen (ADVIL) 800 MG tablet    Sig: Take 1 tablet (800 mg total) by mouth every 8 (eight) hours as needed.    Dispense:  30 tablet    Refill:  0     Procedures: No procedures performed  Clinical Data: No additional findings.  ROS:  All other systems negative, except as noted in the HPI. Review of Systems  Constitutional: Negative for chills and fever.  Musculoskeletal: Positive for arthralgias, gait problem and myalgias.  Skin: Positive for color change.    Objective: Vital Signs: Ht 5\' 3"  (1.6 m)   Wt 157 lb (71.2 kg)   BMI 27.81 kg/m   Specialty Comments:  No specialty comments available.  PMFS History: Patient Active Problem List   Diagnosis Date Noted  . High serum low-density lipoprotein (LDL) 11/30/2018  . Vitamin D insufficiency 11/30/2018  . High serum high density lipoprotein (HDL) 11/30/2018  . newer onset Exercise intolerance * 4-6 mo 08/15/2017  . Complaints of transient weakness of bilateral lower extremity 08/15/2017  . Transient episodes of numbness of both lower extremities 08/15/2017  . Atypical nevus of back 08/07/2017  . Reflux esophagitis 08/02/2017  . Persistent dry cough 08/02/2017  . Muscle spasm of lower bilateral back 08/02/2017  . Muscle spasms of neck 08/02/2017  . Hoarseness or changing voice 08/02/2017  . Left knee pain 06/30/2017  . Osteoarthritis of knee 06/20/2017  . Cough in adult 03/07/2017  . Acute maxillary sinusitis  03/07/2017  . Chronic pain of left knee 10/25/2016  . Primary osteoarthritis of both knees 10/18/2016  . Urinary incontinence 10/08/2016  . Chronic pain of both knees 10/08/2016  . Generalized osteoarthritis of multiple sites 10/08/2016  . Vegan diet 08/27/2016  . Thyroid goiter 08/27/2016  . h/o Near syncope 08/20/2013  . h/o chronic Dizziness 08/20/2013  . Pain in joint, shoulder region 09/26/2012  . Biceps tendinopathy 09/26/2012  . Right wrist pain 09/26/2012  . S/P AV nodal ablation 08/05/2011  . h/o Paroxysmal SVT (supraventricular tachycardia) ([place]) 10/06/2010  . Palpitations 08/26/2010  . Obesity 08/26/2010   Past Medical History:  Diagnosis Date  . Arthritis   . Chest pain   . Heart murmur   . Herpes   . History of hepatitis    and mononucleosis  . Hypothyroidism   . Mononucleosis 1967  . Multinodular goiter   . Osteopenia   . Palpitations   . S/P AV nodal ablation 08/05/2011  . SVT (supraventricular tachycardia) ([place])   . Thyroid nodule     Family History  Problem Relation Age of Onset  . Dementia Mother 57       alive  . Transient ischemic attack Mother        hx of; has a pacemaker  . Hypothyroidism Mother   . Other Father 69       old age. Develop CHF the last 4 months  . Other Sister 52       died of thymoma  . Hypothyroidism Sister   . Other Maternal Aunt        nasal tumor  . Heart attack Maternal Grandfather   . Hypothyroidism Maternal Aunt   . Stroke Paternal Grandfather   . Colon cancer Neg Hx   . Esophageal cancer Neg Hx   . Pancreatic cancer Neg Hx   . Prostate cancer Neg Hx   . Rectal cancer Neg Hx   . Stomach cancer Neg Hx     Past Surgical History:  Procedure Laterality Date  . BREAST CYST ASPIRATION Bilateral 12/15/2000  . CATARACT EXTRACTION W/ INTRAOCULAR LENS IMPLANT Bilateral 2014  . paragard iud     8/90  . SUPRAVENTRICULAR TACHYCARDIA ABLATION N/A 08/05/2011   Procedure: SUPRAVENTRICULAR TACHYCARDIA ABLATION;  Surgeon:  Evans Lance, MD;  Location: [facility] CATH LAB;  Service: Cardiovascular;  Laterality: N/A;  . TONSILLECTOMY     Social History   Occupational History  . Not on file  Tobacco Use  . Smoking status: Former Smoker    Types: Cigarettes    Quit date: 03/01/1970    Years since quitting: 49.7  . Smokeless tobacco: Never Used  Vaping Use  . Vaping Use: Never  used  Substance and Sexual Activity  . Alcohol use: Yes    Alcohol/week: 0.0 standard drinks    Comment: occasional  . Drug use: No  . Sexual activity: Never    Birth control/protection: Post-menopausal

## 2019-11-26 ENCOUNTER — Other Ambulatory Visit: Payer: Self-pay

## 2019-11-26 ENCOUNTER — Ambulatory Visit (INDEPENDENT_AMBULATORY_CARE_PROVIDER_SITE_OTHER): Payer: Medicare Other | Admitting: Physician Assistant

## 2019-11-26 ENCOUNTER — Encounter: Payer: Self-pay | Admitting: Physician Assistant

## 2019-11-26 ENCOUNTER — Ambulatory Visit: Payer: Self-pay | Admitting: Urology

## 2019-11-26 VITALS — BP 108/66 | HR 69 | Ht 63.0 in | Wt 157.5 lb

## 2019-11-26 DIAGNOSIS — Z9889 Other specified postprocedural states: Secondary | ICD-10-CM | POA: Diagnosis not present

## 2019-11-26 DIAGNOSIS — E559 Vitamin D deficiency, unspecified: Secondary | ICD-10-CM

## 2019-11-26 DIAGNOSIS — E7800 Pure hypercholesterolemia, unspecified: Secondary | ICD-10-CM | POA: Diagnosis not present

## 2019-11-26 DIAGNOSIS — Z0000 Encounter for general adult medical examination without abnormal findings: Secondary | ICD-10-CM | POA: Diagnosis not present

## 2019-11-26 DIAGNOSIS — I471 Supraventricular tachycardia: Secondary | ICD-10-CM

## 2019-11-26 NOTE — Progress Notes (Signed)
Subjective:   LAQUINTA HAZELL is a 74 y.o. female who presents for Medicare Annual (Subsequent) preventive examination.  Review of Systems     General:   No F/C, wt loss Pulm:   No DIB, SOB, pleuritic chest pain Card:  No CP, palpitations Abd:  No n/v/d or pain Ext:  No inc edema from baseline  Objective:    Today's Vitals   11/26/19 0955  BP: 108/66  Pulse: 69  SpO2: 97%  Weight: 157 lb 8 oz (71.4 kg)  Height: 5\' 3"  (1.6 m)   Body mass index is 27.9 kg/m.  Advanced Directives 05/04/2019 12/17/2016 08/27/2016 08/05/2011 08/05/2011  Does Patient Have a Medical Advance Directive? No No No Patient does not have advance directive Patient does not have advance directive  Would patient like information on creating a medical advance directive? No - Patient declined - No - Patient declined - -  Pre-existing out of facility DNR order (yellow form or pink MOST form) - - - No -    Current Medications (verified) Outpatient Encounter Medications as of 11/26/2019  Medication Sig  . aspirin EC 81 MG tablet Take 81 mg by mouth daily.  . Flaxseed, Linseed, (FLAX SEEDS PO) Take 30 mLs by mouth daily.  . folic acid (FOLVITE) 761 MCG tablet Take 400 mcg by mouth daily.  Marland Kitchen GINSENG PO Take by mouth daily.  Marland Kitchen ibuprofen (ADVIL) 800 MG tablet Take 1 tablet (800 mg total) by mouth every 8 (eight) hours as needed.  Marland Kitchen levothyroxine (SYNTHROID, LEVOTHROID) 25 MCG tablet Take 1 tablet by mouth daily.  . Multiple Vitamin (MULTIVITAMIN) tablet Take 1 tablet by mouth daily.  Marland Kitchen oxybutynin (DITROPAN-XL) 10 MG 24 hr tablet Take 1 tablet (10 mg total) by mouth daily.  . Probiotic Product (PROBIOTIC FORMULA PO) Take 1 capsule by mouth daily. New chapter All-Flora.  . sodium fluoride (FLUORISHIELD) 1.1 % GEL dental gel Place onto teeth.  . TURMERIC PO Take by mouth daily.  . Vitamin D, Ergocalciferol, (DRISDOL) 1.25 MG (50000 UNIT) CAPS capsule Take one tablet wkly**PATIENT NEEDS APT FOR FURTHER REFILLS**  .  mirabegron ER (MYRBETRIQ) 50 MG TB24 tablet Take 1 tablet (50 mg total) by mouth daily.  . solifenacin (VESICARE) 5 MG tablet Take 1 tablet (5 mg total) by mouth daily.   Facility-Administered Encounter Medications as of 11/26/2019  Medication  . bupivacaine (MARCAINE) 0.5 % injection 2 mL  . lidocaine (XYLOCAINE) 2 % (with pres) injection 20 mg  . methylPREDNISolone acetate (DEPO-MEDROL) injection 40 mg    Allergies (verified) Patient has no known allergies.   History: Past Medical History:  Diagnosis Date  . Arthritis   . Chest pain   . Heart murmur   . Herpes   . History of hepatitis    and mononucleosis  . Hypothyroidism   . Mononucleosis 1967  . Multinodular goiter   . Osteopenia   . Palpitations   . S/P AV nodal ablation 08/05/2011  . SVT (supraventricular tachycardia) ([place])   . Thyroid disease    Phreesia 11/23/2019  . Thyroid nodule    Past Surgical History:  Procedure Laterality Date  . BREAST CYST ASPIRATION Bilateral 12/15/2000  . CATARACT EXTRACTION W/ INTRAOCULAR LENS IMPLANT Bilateral 2014  . paragard iud     8/90  . SUPRAVENTRICULAR TACHYCARDIA ABLATION N/A 08/05/2011   Procedure: SUPRAVENTRICULAR TACHYCARDIA ABLATION;  Surgeon: Evans Lance, MD;  Location: [facility] CATH LAB;  Service: Cardiovascular;  Laterality: N/A;  . TONSILLECTOMY  Family History  Problem Relation Age of Onset  . Dementia Mother 72       alive  . Transient ischemic attack Mother        hx of; has a pacemaker  . Hypothyroidism Mother   . Other Father 84       old age. Develop CHF the last 4 months  . Other Sister 71       died of thymoma  . Hypothyroidism Sister   . Other Maternal Aunt        nasal tumor  . Heart attack Maternal Grandfather   . Hypothyroidism Maternal Aunt   . Stroke Paternal Grandfather   . Colon cancer Neg Hx   . Esophageal cancer Neg Hx   . Pancreatic cancer Neg Hx   . Prostate cancer Neg Hx   . Rectal cancer Neg Hx   . Stomach cancer Neg Hx     Social History   Socioeconomic History  . Marital status: Divorced    Spouse name: Not on file  . Number of children: 2  . Years of education: Not on file  . Highest education level: Not on file  Occupational History  . Not on file  Tobacco Use  . Smoking status: Former Smoker    Packs/day: 2.00    Years: 2.00    Pack years: 4.00    Types: Cigarettes    Start date: 03/01/1964    Quit date: 03/01/1970    Years since quitting: 49.7  . Smokeless tobacco: Never Used  Vaping Use  . Vaping Use: Never used  Substance and Sexual Activity  . Alcohol use: Yes    Alcohol/week: 0.0 standard drinks    Comment: occasional  . Drug use: No  . Sexual activity: Never    Birth control/protection: Post-menopausal  Other Topics Concern  . Not on file  Social History Narrative  . Not on file   Social Determinants of Health   Financial Resource Strain:   . Difficulty of Paying Living Expenses: Not on file  Food Insecurity:   . Worried About Charity fundraiser in the Last Year: Not on file  . Ran Out of Food in the Last Year: Not on file  Transportation Needs:   . Lack of Transportation (Medical): Not on file  . Lack of Transportation (Non-Medical): Not on file  Physical Activity:   . Days of Exercise per Week: Not on file  . Minutes of Exercise per Session: Not on file  Stress:   . Feeling of Stress : Not on file  Social Connections:   . Frequency of Communication with Friends and Family: Not on file  . Frequency of Social Gatherings with Friends and Family: Not on file  . Attends Religious Services: Not on file  . Active Member of Clubs or Organizations: Not on file  . Attends Archivist Meetings: Not on file  . Marital Status: Not on file    Tobacco Counseling Counseling given: Not Answered   Diabetic?No     Activities of Daily Living In your present state of health, do you have any difficulty performing the following activities: 11/26/2019 09/24/2019  Hearing?  N N  Vision? Y Y  Difficulty concentrating or making decisions? N N  Walking or climbing stairs? Y Y  Dressing or bathing? N N  Doing errands, shopping? N N  Some recent data might be hidden    Patient Care Team: Lorrene Reid, PA-C as PCP - General Adrian Prows,  MD as Consulting Physician (Cardiology) Evans Lance, MD as Consulting Physician (Cardiology) Juanita Craver, MD as Consulting Physician (Gastroenterology) Jacelyn Pi, MD as Consulting Physician (Endocrinology) Macarthur Critchley, OD as Referring Physician (Optometry) Darleen Crocker, MD as Consulting Physician (Ophthalmology) Linton Rump, PT as Physical Therapist (Physical Therapy) Megan Salon, MD as Consulting Physician (Gynecology)  Indicate any recent Medical Services you may have received from other than [facility] providers in the past year (date may be approximate).     Assessment:   This is a routine wellness examination for Evangaline.  Hearing/Vision screen No exam data present  Dietary issues and exercise activities discussed:  -Follows a vegetarian diet and currently not as active due to recovering from foot fracture.   Goals   None    Depression Screen PHQ 2/9 Scores 11/26/2019 09/24/2019 12/07/2018 11/30/2018 11/20/2018 09/16/2017 08/29/2017  PHQ - 2 Score 0 0 0 0 0 0 0  PHQ- 9 Score 2 1 1 1  0 0 0    Fall Risk Fall Risk  11/26/2019 09/24/2019 11/20/2018 08/29/2017 06/20/2017  Falls in the past year? 1 0 1 No No  Number falls in past yr: 0 - 0 - -  Injury with Fall? 1 - 0 - -  Risk for fall due to : - No Fall Risks - - -  Follow up Falls evaluation completed Falls evaluation completed - - -    Any stairs in or around the home? Yes  If so, are there any without handrails? Yes  Home free of loose throw rugs in walkways, pet beds, electrical cords, etc? No  Adequate lighting in your home to reduce risk of falls? Yes   ASSISTIVE DEVICES UTILIZED TO PREVENT FALLS:  Life alert? No  Use of a cane, walker or w/c? No   Grab bars in the bathroom? No  Shower chair or bench in shower? No  Elevated toilet seat or a handicapped toilet? Yes   TIMED UP AND GO:  Was the test performed? Yes .  Length of time to ambulate 10 feet: 16 sec.    Gait slow and steady without use of assistive device  Cognitive Function: WNL     6CIT Screen 11/26/2019 11/20/2018 08/29/2017  What Year? 0 points 0 points 0 points  What month? 0 points 0 points 0 points  What time? 0 points 0 points 0 points  Count back from 20 2 points 0 points 0 points  Months in reverse 0 points 0 points 0 points  Repeat phrase 0 points 0 points 0 points  Total Score 2 0 0    Immunizations Immunization History  Administered Date(s) Administered  . DTaP 03/02/2015  . Pneumococcal Polysaccharide-23 06/22/2013  . Tdap 06/28/2007    TDAP status: Up to date Flu Vaccine status: Up to date Pneumococcal vaccine status: Declined,  Education has been provided regarding the importance of this vaccine but patient still declined. Advised may receive this vaccine at local pharmacy or Health Dept. Aware to provide a copy of the vaccination record if obtained from local pharmacy or Health Dept. Verbalized acceptance and understanding.  Covid-19 vaccine status: Completed vaccines  Qualifies for Shingles Vaccine? Yes   Zostavax completed No   Shingrix Completed?: No.    Education has been provided regarding the importance of this vaccine. Patient has been advised to call insurance company to determine out of pocket expense if they have not yet received this vaccine. Advised may also receive vaccine at local pharmacy or Health Dept. Verbalized  acceptance and understanding.  Screening Tests Health Maintenance  Topic Date Due  . COVID-19 Vaccine (1) Never done  . PNA vac Low Risk Adult (2 of 2 - PCV13) 06/23/2014  . INFLUENZA VACCINE  Never done  . Hepatitis C Screening  08/26/2028 (Originally 1945-10-30)  . MAMMOGRAM  05/03/2021  . TETANUS/TDAP  04/01/2025   . COLONOSCOPY  12/28/2026  . DEXA SCAN  Completed    Health Maintenance  Health Maintenance Due  Topic Date Due  . COVID-19 Vaccine (1) Never done  . PNA vac Low Risk Adult (2 of 2 - PCV13) 06/23/2014  . INFLUENZA VACCINE  Never done    Colorectal cancer screening: Completed 12/27/2016. Repeat every 10 years Mammogram status: Completed 05/04/2019. Repeat every year Bone Density status: Completed 06/08/2019. Results reflect: Bone density results: NORMAL. Repeat every 3 years.  Lung Cancer Screening: (Low Dose CT Chest recommended if Age 78-80 years, 30 pack-year currently smoking OR have quit w/in 15years.) does not qualify.   Lung Cancer Screening Referral: No  Additional Screening:  Hepatitis C Screening: does qualify; Patient declined  Vision Screening: Recommended annual ophthalmology exams for early detection of glaucoma and other disorders of the eye. Is the patient up to date with their annual eye exam?  Yes  Who is the provider or what is the name of the office in which the patient attends annual eye exams? Dr. Luretha Rued If pt is not established with a provider, would they like to be referred to a provider to establish care? No .   Dental Screening: Recommended annual dental exams for proper oral hygiene  Community Resource Referral / Chronic Care Management: CRR required this visit?  No   CCM required this visit?  No      Plan:  -Continue current medication regimen. Pending Vit D lab will send additional refills. -Continue to follow up with various specialists. -Continue to monitor cardiac arrhythmia senstation and recommend to follow up with cardiology if episodes become more frequent or longer lasting. Pt reports rare and brief episodes of tachycardia. S/p ablation for SVT. -Will notify of lab results. -Follow up in 4-6 months for reg OV: SVT, Vit D insuff, thyroid  I have personally reviewed and noted the following in the patient's chart:   . Medical and  social history . Use of alcohol, tobacco or illicit drugs  . Current medications and supplements . Functional ability and status . Nutritional status . Physical activity . Advanced directives . List of other physicians . Hospitalizations, surgeries, and ER visits in previous 12 months . Vitals . Screenings to include cognitive, depression, and falls . Referrals and appointments  In addition, I have reviewed and discussed with patient certain preventive protocols, quality metrics, and best practice recommendations. A written personalized care plan for preventive services as well as general preventive health recommendations were provided to patient.     Lorrene Reid, PA-C   11/26/2019

## 2019-11-26 NOTE — Patient Instructions (Signed)
Preventive Care 38 Years and Older, Female Preventive care refers to lifestyle choices and visits with your health care provider that can promote health and wellness. This includes:  A yearly physical exam. This is also called an annual well check.  Regular dental and eye exams.  Immunizations.  Screening for certain conditions.  Healthy lifestyle choices, such as diet and exercise. What can I expect for my preventive care visit? Physical exam Your health care provider will check:  Height and weight. These may be used to calculate body mass index (BMI), which is a measurement that tells if you are at a healthy weight.  Heart rate and blood pressure.  Your skin for abnormal spots. Counseling Your health care provider may ask you questions about:  Alcohol, tobacco, and drug use.  Emotional well-being.  Home and relationship well-being.  Sexual activity.  Eating habits.  History of falls.  Memory and ability to understand (cognition).  Work and work Statistician.  Pregnancy and menstrual history. What immunizations do I need?  Influenza (flu) vaccine  This is recommended every year. Tetanus, diphtheria, and pertussis (Tdap) vaccine  You may need a Td booster every 10 years. Varicella (chickenpox) vaccine  You may need this vaccine if you have not already been vaccinated. Zoster (shingles) vaccine  You may need this after age 33. Pneumococcal conjugate (PCV13) vaccine  One dose is recommended after age 33. Pneumococcal polysaccharide (PPSV23) vaccine  One dose is recommended after age 72. Measles, mumps, and rubella (MMR) vaccine  You may need at least one dose of MMR if you were born in 1957 or later. You may also need a second dose. Meningococcal conjugate (MenACWY) vaccine  You may need this if you have certain conditions. Hepatitis A vaccine  You may need this if you have certain conditions or if you travel or work in places where you may be exposed  to hepatitis A. Hepatitis B vaccine  You may need this if you have certain conditions or if you travel or work in places where you may be exposed to hepatitis B. Haemophilus influenzae type b (Hib) vaccine  You may need this if you have certain conditions. You may receive vaccines as individual doses or as more than one vaccine together in one shot (combination vaccines). Talk with your health care provider about the risks and benefits of combination vaccines. What tests do I need? Blood tests  Lipid and cholesterol levels. These may be checked every 5 years, or more frequently depending on your overall health.  Hepatitis C test.  Hepatitis B test. Screening  Lung cancer screening. You may have this screening every year starting at age 39 if you have a 30-pack-year history of smoking and currently smoke or have quit within the past 15 years.  Colorectal cancer screening. All adults should have this screening starting at age 36 and continuing until age 15. Your health care provider may recommend screening at age 23 if you are at increased risk. You will have tests every 1-10 years, depending on your results and the type of screening test.  Diabetes screening. This is done by checking your blood sugar (glucose) after you have not eaten for a while (fasting). You may have this done every 1-3 years.  Mammogram. This may be done every 1-2 years. Talk with your health care provider about how often you should have regular mammograms.  BRCA-related cancer screening. This may be done if you have a family history of breast, ovarian, tubal, or peritoneal cancers.  Other tests  Sexually transmitted disease (STD) testing.  Bone density scan. This is done to screen for osteoporosis. You may have this done starting at age 44. Follow these instructions at home: Eating and drinking  Eat a diet that includes fresh fruits and vegetables, whole grains, lean protein, and low-fat dairy products. Limit  your intake of foods with high amounts of sugar, saturated fats, and salt.  Take vitamin and mineral supplements as recommended by your health care provider.  Do not drink alcohol if your health care provider tells you not to drink.  If you drink alcohol: ? Limit how much you have to 0-1 drink a day. ? Be aware of how much alcohol is in your drink. In the [place] one drink equals one 12 oz bottle of beer (355 mL), one 5 oz glass of wine (148 mL), or one 1 oz glass of hard liquor (44 mL). Lifestyle  Take daily care of your teeth and gums.  Stay active. Exercise for at least 30 minutes on 5 or more days each week.  Do not use any products that contain nicotine or tobacco, such as cigarettes, e-cigarettes, and chewing tobacco. If you need help quitting, ask your health care provider.  If you are sexually active, practice safe sex. Use a condom or other form of protection in order to prevent STIs (sexually transmitted infections).  Talk with your health care provider about taking a low-dose aspirin or statin. What's next?  Go to your health care provider once a year for a well check visit.  Ask your health care provider how often you should have your eyes and teeth checked.  Stay up to date on all vaccines. This information is not intended to replace advice given to you by your health care provider. Make sure you discuss any questions you have with your health care provider. Document Revised: 02/09/2018 Document Reviewed: 02/09/2018 Elsevier Patient Education  2020 [employer].

## 2019-11-27 ENCOUNTER — Other Ambulatory Visit: Payer: Self-pay | Admitting: Physician Assistant

## 2019-11-27 DIAGNOSIS — E559 Vitamin D deficiency, unspecified: Secondary | ICD-10-CM

## 2019-11-27 LAB — CBC WITH DIFFERENTIAL/PLATELET
Basophils Absolute: 0 10*3/uL (ref 0.0–0.2)
Basos: 1 %
EOS (ABSOLUTE): 0.3 10*3/uL (ref 0.0–0.4)
Eos: 6 %
Hematocrit: 39 % (ref 34.0–46.6)
Hemoglobin: 13.1 g/dL (ref 11.1–15.9)
Immature Grans (Abs): 0 10*3/uL (ref 0.0–0.1)
Immature Granulocytes: 0 %
Lymphocytes Absolute: 1.4 10*3/uL (ref 0.7–3.1)
Lymphs: 23 %
MCH: 31.8 pg (ref 26.6–33.0)
MCHC: 33.6 g/dL (ref 31.5–35.7)
MCV: 95 fL (ref 79–97)
Monocytes Absolute: 0.7 10*3/uL (ref 0.1–0.9)
Monocytes: 11 %
Neutrophils Absolute: 3.4 10*3/uL (ref 1.4–7.0)
Neutrophils: 59 %
Platelets: 261 10*3/uL (ref 150–450)
RBC: 4.12 x10E6/uL (ref 3.77–5.28)
RDW: 12.2 % (ref 11.7–15.4)
WBC: 5.8 10*3/uL (ref 3.4–10.8)

## 2019-11-27 LAB — COMPREHENSIVE METABOLIC PANEL
ALT: 23 IU/L (ref 0–32)
AST: 20 IU/L (ref 0–40)
Albumin/Globulin Ratio: 1.8 (ref 1.2–2.2)
Albumin: 4.2 g/dL (ref 3.7–4.7)
Alkaline Phosphatase: 94 IU/L (ref 44–121)
BUN/Creatinine Ratio: 18 (ref 12–28)
BUN: 14 mg/dL (ref 8–27)
Bilirubin Total: 0.6 mg/dL (ref 0.0–1.2)
CO2: 24 mmol/L (ref 20–29)
Calcium: 9.2 mg/dL (ref 8.7–10.3)
Chloride: 105 mmol/L (ref 96–106)
Creatinine, Ser: 0.78 mg/dL (ref 0.57–1.00)
GFR calc Af Amer: 87 mL/min/{1.73_m2} (ref >59)
GFR calc non Af Amer: 76 mL/min/{1.73_m2} (ref >59)
Globulin, Total: 2.3 g/dL (ref 1.5–4.5)
Glucose: 91 mg/dL (ref 65–99)
Potassium: 4.3 mmol/L (ref 3.5–5.2)
Sodium: 141 mmol/L (ref 134–144)
Total Protein: 6.5 g/dL (ref 6.0–8.5)

## 2019-11-27 LAB — LIPID PANEL
Chol/HDL Ratio: 2.8 ratio (ref 0.0–4.4)
Cholesterol, Total: 207 mg/dL — ABNORMAL HIGH (ref 100–199)
HDL: 73 mg/dL (ref >39)
LDL Chol Calc (NIH): 122 mg/dL — ABNORMAL HIGH (ref 0–99)
Triglycerides: 66 mg/dL (ref 0–149)
VLDL Cholesterol Cal: 12 mg/dL (ref 5–40)

## 2019-11-27 LAB — VITAMIN D 25 HYDROXY (VIT D DEFICIENCY, FRACTURES): Vit D, 25-Hydroxy: 46 ng/mL (ref 30.0–100.0)

## 2019-11-27 MED ORDER — VITAMIN D (ERGOCALCIFEROL) 1.25 MG (50000 UNIT) PO CAPS: ORAL_CAPSULE | ORAL | 1 refills | Status: DC

## 2019-12-03 ENCOUNTER — Encounter: Payer: Self-pay | Admitting: Urology

## 2019-12-03 ENCOUNTER — Ambulatory Visit (INDEPENDENT_AMBULATORY_CARE_PROVIDER_SITE_OTHER): Payer: Medicare Other | Admitting: Urology

## 2019-12-03 ENCOUNTER — Other Ambulatory Visit: Payer: Self-pay

## 2019-12-03 VITALS — BP 129/79 | HR 79 | Temp 98.0°F | Ht 63.0 in | Wt 159.2 lb

## 2019-12-03 DIAGNOSIS — N3946 Mixed incontinence: Secondary | ICD-10-CM | POA: Diagnosis not present

## 2019-12-03 MED ORDER — TOLTERODINE TARTRATE ER 4 MG PO CP24: 4.0000 mg | ORAL_CAPSULE | Freq: Every day | ORAL | 11 refills | Status: DC

## 2019-12-03 MED ORDER — SOLIFENACIN SUCCINATE 5 MG PO TABS: 5.0000 mg | ORAL_TABLET | Freq: Every day | ORAL | 11 refills | Status: DC

## 2019-12-03 NOTE — Patient Instructions (Signed)
We have sent in prescriptions for Detrol LA and given you a prescription for Vesicare to try.  Follow up here in 8 weeks.

## 2019-12-03 NOTE — Progress Notes (Signed)
12/03/2019 12:06 PM   Lindsay [name] Rash 09-30-1945 [id number]  Referring provider: Lorrene Reid, PA-C [place],  [place] [address]  Chief Complaint  Patient presents with  . Follow-up    HPI: Patient has urgency incontinence worsening over many months. She wears a pad for confidence and leaks that she is out in public. She is bathroom mapping and again does not always leak. No stress incontinence or bedwetting. Voids every 30 to 60 minutes and cannot hold it for 2 hours. Gets up 3-4 times a night  Narrow introitus. Small grade 1 cystocele larger near the trigone but did not descend. It was asymptomatic. Mild hypermobility the bladder neck and negative cough test  Patient has mild overactive bladder and urgency incontinence. Medical behavioral therapy and physical therapy discussed if we reach her treatment goal I likely would try to stop the medication in the future  We will see the physical therapist and see me in 4 months. She took the Myrbetriq samples and prescription and she may or may not start them  Today Frequency stable.  Urge incontinence stable.  Clinically not infected.  Failed Myrbetriq and physical therapy but was not that compliant    Today Frequency stable. Oxybutynin has helped minimally. She never filled the Vesicare because it might be expensive. Clinically not infected. Still has uncommon urge incontinence   PMH: Past Medical History:  Diagnosis Date  . Arthritis   . Chest pain   . Heart murmur   . Herpes   . History of hepatitis    and mononucleosis  . Hypothyroidism   . Mononucleosis 1967  . Multinodular goiter   . Osteopenia   . Palpitations   . S/P AV nodal ablation 08/05/2011  . SVT (supraventricular tachycardia) ([place])   . Thyroid disease    Phreesia 11/23/2019  . Thyroid nodule     Surgical History: Past Surgical History:  Procedure Laterality Date  . BREAST CYST ASPIRATION Bilateral 12/15/2000  .  CATARACT EXTRACTION W/ INTRAOCULAR LENS IMPLANT Bilateral 2014  . paragard iud     8/90  . SUPRAVENTRICULAR TACHYCARDIA ABLATION N/A 08/05/2011   Procedure: SUPRAVENTRICULAR TACHYCARDIA ABLATION;  Surgeon: Evans Lance, MD;  Location[facility] CATH LAB;  Service: Cardiovascular;  Laterality: N/A;  . TONSILLECTOMY      Home Medications:  Allergies as of 12/03/2019   No Known Allergies     Medication List       Accurate as of December 03, 2019 12:06 PM. If you have any questions, ask your nurse or doctor.        STOP taking these medications   mirabegron ER 50 MG Tb24 tablet Commonly known as: MYRBETRIQ Stopped by: Reece Packer, MD     TAKE these medications   aspirin EC 81 MG tablet Take 81 mg by mouth daily.   FLAX SEEDS PO Take 30 mLs by mouth daily.   folic acid 502 MCG tablet Commonly known as: FOLVITE Take 400 mcg by mouth daily.   GINSENG PO Take by mouth daily.   ibuprofen 800 MG tablet Commonly known as: ADVIL Take 1 tablet (800 mg total) by mouth every 8 (eight) hours as needed.   levothyroxine 25 MCG tablet Commonly known as: SYNTHROID Take 1 tablet by mouth daily.   multivitamin tablet Take 1 tablet by mouth daily.   oxybutynin 10 MG 24 hr tablet Commonly known as: DITROPAN-XL Take 1 tablet (10 mg total) by mouth daily.   PROBIOTIC  FORMULA PO Take 1 capsule by mouth daily. New chapter All-Flora.   sodium fluoride 1.1 % Gel dental gel Commonly known as: FLUORISHIELD Place onto teeth.   solifenacin 5 MG tablet Commonly known as: VESIcare Take 1 tablet (5 mg total) by mouth daily.   tolterodine 4 MG 24 hr capsule Commonly known as: Detrol LA Take 1 capsule (4 mg total) by mouth daily. Started by: Reece Packer, MD   TURMERIC PO Take by mouth daily.   Vitamin D (Ergocalciferol) 1.25 MG (50000 UNIT) Caps capsule Commonly known as: DRISDOL Take one tablet wkly       Allergies: No Known Allergies  Family History: Family History    Problem Relation Age of Onset  . Dementia Mother 78       alive  . Transient ischemic attack Mother        hx of; has a pacemaker  . Hypothyroidism Mother   . Other Father 42       old age. Develop CHF the last 4 months  . Other Sister 36       died of thymoma  . Hypothyroidism Sister   . Other Maternal Aunt        nasal tumor  . Heart attack Maternal Grandfather   . Hypothyroidism Maternal Aunt   . Stroke Paternal Grandfather   . Colon cancer Neg Hx   . Esophageal cancer Neg Hx   . Pancreatic cancer Neg Hx   . Prostate cancer Neg Hx   . Rectal cancer Neg Hx   . Stomach cancer Neg Hx     Social History:  reports that she quit smoking about 49 years ago. Her smoking use included cigarettes. She started smoking about 55 years ago. She has a 4.00 pack-year smoking history. She has never used smokeless tobacco. She reports current alcohol use. She reports that she does not use drugs.  ROS:                                        Physical Exam: BP 129/79   Pulse 79   Temp 98 F (36.7 C)   Ht 5\' 3"  (1.6 m)   Wt 72.2 kg   BMI 28.20 kg/m   Constitutional:  Alert and oriented, No acute distress.  Laboratory Data: Lab Results  Component Value Date   WBC 5.8 11/26/2019   HGB 13.1 11/26/2019   HCT 39.0 11/26/2019   MCV 95 11/26/2019   PLT 261 11/26/2019    Lab Results  Component Value Date   CREATININE 0.78 11/26/2019    No results found for: PSA  No results found for: TESTOSTERONE  Lab Results  Component Value Date   HGBA1C CANCELED 11/20/2018    Urinalysis    Component Value Date/Time   COLORURINE YELLOW 08/20/2013 1030   APPEARANCEUR Clear 04/02/2019 1336   LABSPEC 1.020 08/20/2013 1030   PHURINE 6.5 08/20/2013 1030   GLUCOSEU Negative 04/02/2019 1336   HGBUR NEGATIVE 08/20/2013 1030   BILIRUBINUR Negative 04/02/2019 1336   KETONESUR 15 (A) 08/20/2013 1030   PROTEINUR Negative 04/02/2019 1336   PROTEINUR NEGATIVE 08/20/2013  1030   UROBILINOGEN 0.2 08/20/2013 1030   NITRITE Negative 04/02/2019 1336   NITRITE NEGATIVE 08/20/2013 1030   LEUKOCYTESUR Negative 04/02/2019 1336    Pertinent Imaging:   Assessment & Plan: I sent in Detrol LA 30 tabs 11 refills and hand-delivered Vesicare  prescription. Reassess 8 weeks and discuss goals then  There are no diagnoses linked to this encounter.  No follow-ups on file.  Reece Packer, MD  [name] [address] [place], [place] [address] [phone]

## 2019-12-07 ENCOUNTER — Encounter: Payer: Self-pay | Admitting: Family

## 2019-12-07 ENCOUNTER — Ambulatory Visit (INDEPENDENT_AMBULATORY_CARE_PROVIDER_SITE_OTHER): Payer: Medicare Other | Admitting: Family

## 2019-12-07 ENCOUNTER — Ambulatory Visit: Payer: Self-pay

## 2019-12-07 DIAGNOSIS — S92355A Nondisplaced fracture of fifth metatarsal bone, left foot, initial encounter for closed fracture: Secondary | ICD-10-CM

## 2019-12-07 MED ORDER — IBUPROFEN 800 MG PO TABS: 800.0000 mg | ORAL_TABLET | Freq: Three times a day (TID) | ORAL | 2 refills | Status: DC | PRN

## 2019-12-07 NOTE — Progress Notes (Signed)
Office Visit Note   Patient: Lindsay [name]           Date of Birth: September 09, 1945           MRN: [id number] Visit Date: 12/07/2019              Requested by: Lorrene Reid, PA-C [place],  [place] [address] PCP: Lorrene Reid, PA-C  No chief complaint on file.     HPI: The patient is a 74 year old woman who presents in follow up for fracture to fifth metatarsal left foot. Thinks initial injury was august 28.  She has been in a postop shoe.  She is feeling better does continue to have aching and pain that increases over the course of the day.   Assessment & Plan: Visit Diagnoses:  1. Closed nondisplaced fracture of fifth metatarsal bone of left foot, initial encounter     Plan: She will continue in her post op shoe. Discussed transitioning to a still soled walking shoe if she can find/purchase one that is comfortable and can ambulate without pain in this. Advance activities as tolerated. Follow up once more in 3 weeks.   Follow-Up Instructions: No follow-ups on file.   Ortho Exam  Patient is alert, oriented, no adenopathy, well-dressed, normal affect, normal respiratory effort. On examination of the right foot there is minimal swelling.  She is minimally tender over the dorsum of her foot specifically over the base of the fourth and fifth metatarsals tender to the base of the fifth. No edema. No impending skin breakdown.  Imaging: No results found. No images are attached to the encounter.  Labs: Lab Results  Component Value Date   HGBA1C CANCELED 11/20/2018   HGBA1C 5.5 09/03/2016   REPTSTATUS 08/21/2013 FINAL 08/20/2013   CULT  08/20/2013    Multiple bacterial morphotypes present, none predominant. Suggest appropriate recollection if clinically indicated. Performed at [facility]     Lab Results  Component Value Date   ALBUMIN 4.2 11/26/2019   ALBUMIN 3.9 11/20/2018   ALBUMIN 4.3 09/03/2016    No results found for: MG Lab  Results  Component Value Date   VD25OH 46.0 11/26/2019   VD25OH 49.5 05/11/2019   VD25OH 37.2 11/20/2018    No results found for: PREALBUMIN CBC EXTENDED Latest Ref Rng & Units 11/26/2019 11/20/2018 09/03/2016  WBC 3.4 - 10.8 x10E3/uL 5.8 5.5 4.8  RBC 3.77 - 5.28 x10E6/uL 4.12 3.98 4.25  HGB 11.1 - 15.9 g/dL 13.1 12.6 12.9  HCT 34.0 - 46.6 % 39.0 37.3 39.0  PLT 150 - 450 x10E3/uL 261 256 248  NEUTROABS 1 - 7 x10E3/uL 3.4 2.7 2.5  LYMPHSABS 0 - 3 x10E3/uL 1.4 1.8 1.5     There is no height or weight on file to calculate BMI.  Orders:  Orders Placed This Encounter  Procedures  . XR Foot Complete Left   No orders of the defined types were placed in this encounter.    Procedures: No procedures performed  Clinical Data: No additional findings.  ROS:  All other systems negative, except as noted in the HPI. Review of Systems  Constitutional: Negative for chills and fever.  Musculoskeletal: Positive for arthralgias, gait problem and myalgias.  Skin: Positive for color change.    Objective: Vital Signs: There were no vitals taken for this visit.  Specialty Comments:  No specialty comments available.  PMFS History: Patient Active Problem List   Diagnosis Date Noted  . High serum  low-density lipoprotein (LDL) 11/30/2018  . Vitamin D insufficiency 11/30/2018  . High serum high density lipoprotein (HDL) 11/30/2018  . newer onset Exercise intolerance * 4-6 mo 08/15/2017  . Complaints of transient weakness of bilateral lower extremity 08/15/2017  . Transient episodes of numbness of both lower extremities 08/15/2017  . Atypical nevus of back 08/07/2017  . Reflux esophagitis 08/02/2017  . Persistent dry cough 08/02/2017  . Muscle spasm of lower bilateral back 08/02/2017  . Muscle spasms of neck 08/02/2017  . Hoarseness or changing voice 08/02/2017  . Left knee pain 06/30/2017  . Osteoarthritis of knee 06/20/2017  . Cough in adult 03/07/2017  . Acute maxillary sinusitis  03/07/2017  . Chronic pain of left knee 10/25/2016  . Primary osteoarthritis of both knees 10/18/2016  . Urinary incontinence 10/08/2016  . Chronic pain of both knees 10/08/2016  . Generalized osteoarthritis of multiple sites 10/08/2016  . Vegan diet 08/27/2016  . Thyroid goiter 08/27/2016  . h/o Near syncope 08/20/2013  . h/o chronic Dizziness 08/20/2013  . Pain in joint, shoulder region 09/26/2012  . Biceps tendinopathy 09/26/2012  . Right wrist pain 09/26/2012  . S/P AV nodal ablation 08/05/2011  . h/o Paroxysmal SVT (supraventricular tachycardia) ([place]) 10/06/2010  . Palpitations 08/26/2010  . Obesity 08/26/2010   Past Medical History:  Diagnosis Date  . Arthritis   . Chest pain   . Heart murmur   . Herpes   . History of hepatitis    and mononucleosis  . Hypothyroidism   . Mononucleosis 1967  . Multinodular goiter   . Osteopenia   . Palpitations   . S/P AV nodal ablation 08/05/2011  . SVT (supraventricular tachycardia) ([place])   . Thyroid disease    Phreesia 11/23/2019  . Thyroid nodule     Family History  Problem Relation Age of Onset  . Dementia Mother 1       alive  . Transient ischemic attack Mother        hx of; has a pacemaker  . Hypothyroidism Mother   . Other Father 15       old age. Develop CHF the last 4 months  . Other Sister 49       died of thymoma  . Hypothyroidism Sister   . Other Maternal Aunt        nasal tumor  . Heart attack Maternal Grandfather   . Hypothyroidism Maternal Aunt   . Stroke Paternal Grandfather   . Colon cancer Neg Hx   . Esophageal cancer Neg Hx   . Pancreatic cancer Neg Hx   . Prostate cancer Neg Hx   . Rectal cancer Neg Hx   . Stomach cancer Neg Hx     Past Surgical History:  Procedure Laterality Date  . BREAST CYST ASPIRATION Bilateral 12/15/2000  . CATARACT EXTRACTION W/ INTRAOCULAR LENS IMPLANT Bilateral 2014  . paragard iud     8/90  . SUPRAVENTRICULAR TACHYCARDIA ABLATION N/A 08/05/2011   Procedure:  SUPRAVENTRICULAR TACHYCARDIA ABLATION;  Surgeon: Evans Lance, MD;  Location: [facility] CATH LAB;  Service: Cardiovascular;  Laterality: N/A;  . TONSILLECTOMY     Social History   Occupational History  . Not on file  Tobacco Use  . Smoking status: Former Smoker    Packs/day: 2.00    Years: 2.00    Pack years: 4.00    Types: Cigarettes    Start date: 03/01/1964    Quit date: 03/01/1970    Years since quitting: 49.8  . Smokeless  tobacco: Never Used  Vaping Use  . Vaping Use: Never used  Substance and Sexual Activity  . Alcohol use: Yes    Alcohol/week: 0.0 standard drinks    Comment: occasional  . Drug use: No  . Sexual activity: Never    Birth control/protection: Post-menopausal

## 2019-12-14 DIAGNOSIS — E049 Nontoxic goiter, unspecified: Secondary | ICD-10-CM | POA: Diagnosis not present

## 2020-01-03 DIAGNOSIS — S30860A Insect bite (nonvenomous) of lower back and pelvis, initial encounter: Secondary | ICD-10-CM | POA: Diagnosis not present

## 2020-01-04 DIAGNOSIS — E049 Nontoxic goiter, unspecified: Secondary | ICD-10-CM | POA: Diagnosis not present

## 2020-01-28 ENCOUNTER — Ambulatory Visit: Payer: Medicare Other | Admitting: Urology

## 2020-02-02 DIAGNOSIS — Z20822 Contact with and (suspected) exposure to covid-19: Secondary | ICD-10-CM | POA: Diagnosis not present

## 2020-03-31 ENCOUNTER — Other Ambulatory Visit: Payer: Self-pay | Admitting: Obstetrics & Gynecology

## 2020-03-31 DIAGNOSIS — Z1231 Encounter for screening mammogram for malignant neoplasm of breast: Secondary | ICD-10-CM

## 2020-04-07 ENCOUNTER — Ambulatory Visit: Payer: Medicare Other | Admitting: Physician Assistant

## 2020-04-11 ENCOUNTER — Encounter: Payer: Self-pay | Admitting: Physician Assistant

## 2020-04-11 ENCOUNTER — Ambulatory Visit (INDEPENDENT_AMBULATORY_CARE_PROVIDER_SITE_OTHER): Payer: Medicare Other | Admitting: Physician Assistant

## 2020-04-11 VITALS — Ht 63.0 in | Wt 159.0 lb

## 2020-04-11 DIAGNOSIS — M25562 Pain in left knee: Secondary | ICD-10-CM

## 2020-04-11 DIAGNOSIS — M25561 Pain in right knee: Secondary | ICD-10-CM

## 2020-04-11 DIAGNOSIS — G8929 Other chronic pain: Secondary | ICD-10-CM | POA: Diagnosis not present

## 2020-04-11 MED ORDER — LIDOCAINE HCL 1 % IJ SOLN
5.0000 mL | INTRAMUSCULAR | Status: AC | PRN
  Administered 2020-04-11: 5 mL

## 2020-04-11 NOTE — Progress Notes (Signed)
Office Visit Note   Patient: Lindsay [name]           Date of Birth: 08-Oct-1945           MRN: [id number] Visit Date: 04/11/2020              Requested by: Lorrene Reid, PA-C [place],  [place] [address] PCP: Lorrene Reid, PA-C  Chief Complaint  Patient presents with  . Right Knee - Pain  . Left Knee - Pain      HPI: Patient is a pleasant 75 year old woman who comes in today requesting repeat bilateral knee injections.  She has had these in the past and done quite well.  Assessment & Plan: Visit Diagnoses: No diagnosis found.  Plan: Bilateral knee injections done today.  She will follow-up with [place] as needed  Follow-Up Instructions: No follow-ups on file.   Ortho Exam  Patient is alert, oriented, no adenopathy, well-dressed, normal affect, normal respiratory effort. Bilateral knees she has no cellulitis no swelling she does have pain and crepitation with range of motion  Imaging: No results found. No images are attached to the encounter.  Labs: Lab Results  Component Value Date   HGBA1C CANCELED 11/20/2018   HGBA1C 5.5 09/03/2016   REPTSTATUS 08/21/2013 FINAL 08/20/2013   CULT  08/20/2013    Multiple bacterial morphotypes present, none predominant. Suggest appropriate recollection if clinically indicated. Performed at [facility]     Lab Results  Component Value Date   ALBUMIN 4.2 11/26/2019   ALBUMIN 3.9 11/20/2018   ALBUMIN 4.3 09/03/2016    No results found for: MG Lab Results  Component Value Date   VD25OH 46.0 11/26/2019   VD25OH 49.5 05/11/2019   VD25OH 37.2 11/20/2018    No results found for: PREALBUMIN CBC EXTENDED Latest Ref Rng & Units 11/26/2019 11/20/2018 09/03/2016  WBC 3.4 - 10.8 x10E3/uL 5.8 5.5 4.8  RBC 3.77 - 5.28 x10E6/uL 4.12 3.98 4.25  HGB 11.1 - 15.9 g/dL 13.1 12.6 12.9  HCT 34.0 - 46.6 % 39.0 37.3 39.0  PLT 150 - 450 x10E3/uL 261 256 248  NEUTROABS 1.4 - 7.0 x10E3/uL 3.4 2.7 2.5   LYMPHSABS 0.7 - 3.1 x10E3/uL 1.4 1.8 1.5     Body mass index is 28.17 kg/m.  Orders:  No orders of the defined types were placed in this encounter.  No orders of the defined types were placed in this encounter.    Procedures: Large Joint Inj: bilateral knee on 04/11/2020 3:04 PM Indications: pain and diagnostic evaluation Details: 22 G 1.5 in needle, anteromedial approach  Arthrogram: No  Medications (Right): 5 mL lidocaine 1 % Outcome: tolerated well, no immediate complications Procedure, treatment alternatives, risks and benefits explained, specific risks discussed. Consent was given by the patient.      Clinical Data: No additional findings.  ROS:  All other systems negative, except as noted in the HPI. Review of Systems  Objective: Vital Signs: Ht 5\' 3"  (1.6 m)   Wt 159 lb (72.1 kg)   BMI 28.17 kg/m   Specialty Comments:  No specialty comments available.  PMFS History: Patient Active Problem List   Diagnosis Date Noted  . High serum low-density lipoprotein (LDL) 11/30/2018  . Vitamin D insufficiency 11/30/2018  . High serum high density lipoprotein (HDL) 11/30/2018  . newer onset Exercise intolerance * 4-6 mo 08/15/2017  . Complaints of transient weakness of bilateral lower extremity 08/15/2017  . Transient episodes of numbness of  both lower extremities 08/15/2017  . Atypical nevus of back 08/07/2017  . Reflux esophagitis 08/02/2017  . Persistent dry cough 08/02/2017  . Muscle spasm of lower bilateral back 08/02/2017  . Muscle spasms of neck 08/02/2017  . Hoarseness or changing voice 08/02/2017  . Left knee pain 06/30/2017  . Osteoarthritis of knee 06/20/2017  . Cough in adult 03/07/2017  . Acute maxillary sinusitis 03/07/2017  . Chronic pain of left knee 10/25/2016  . Primary osteoarthritis of both knees 10/18/2016  . Urinary incontinence 10/08/2016  . Chronic pain of both knees 10/08/2016  . Generalized osteoarthritis of multiple sites  10/08/2016  . Vegan diet 08/27/2016  . Thyroid goiter 08/27/2016  . h/o Near syncope 08/20/2013  . h/o chronic Dizziness 08/20/2013  . Pain in joint, shoulder region 09/26/2012  . Biceps tendinopathy 09/26/2012  . Right wrist pain 09/26/2012  . S/P AV nodal ablation 08/05/2011  . h/o Paroxysmal SVT (supraventricular tachycardia) ([place]) 10/06/2010  . Palpitations 08/26/2010  . Obesity 08/26/2010   Past Medical History:  Diagnosis Date  . Arthritis   . Chest pain   . Heart murmur   . Herpes   . History of hepatitis    and mononucleosis  . Hypothyroidism   . Mononucleosis 1967  . Multinodular goiter   . Osteopenia   . Palpitations   . S/P AV nodal ablation 08/05/2011  . SVT (supraventricular tachycardia) ([place])   . Thyroid disease    Phreesia 11/23/2019  . Thyroid nodule     Family History  Problem Relation Age of Onset  . Dementia Mother 64       alive  . Transient ischemic attack Mother        hx of; has a pacemaker  . Hypothyroidism Mother   . Other Father 35       old age. Develop CHF the last 4 months  . Other Sister 24       died of thymoma  . Hypothyroidism Sister   . Other Maternal Aunt        nasal tumor  . Heart attack Maternal Grandfather   . Hypothyroidism Maternal Aunt   . Stroke Paternal Grandfather   . Colon cancer Neg Hx   . Esophageal cancer Neg Hx   . Pancreatic cancer Neg Hx   . Prostate cancer Neg Hx   . Rectal cancer Neg Hx   . Stomach cancer Neg Hx     Past Surgical History:  Procedure Laterality Date  . BREAST CYST ASPIRATION Bilateral 12/15/2000  . CATARACT EXTRACTION W/ INTRAOCULAR LENS IMPLANT Bilateral 2014  . paragard iud     8/90  . SUPRAVENTRICULAR TACHYCARDIA ABLATION N/A 08/05/2011   Procedure: SUPRAVENTRICULAR TACHYCARDIA ABLATION;  Surgeon: Evans Lance, MD;  Location: [facility] CATH LAB;  Service: Cardiovascular;  Laterality: N/A;  . TONSILLECTOMY     Social History   Occupational History  . Not on file  Tobacco Use  .  Smoking status: Former Smoker    Packs/day: 2.00    Years: 2.00    Pack years: 4.00    Types: Cigarettes    Start date: 03/01/1964    Quit date: 03/01/1970    Years since quitting: 50.1  . Smokeless tobacco: Never Used  Vaping Use  . Vaping Use: Never used  Substance and Sexual Activity  . Alcohol use: Yes    Alcohol/week: 0.0 standard drinks    Comment: occasional  . Drug use: No  . Sexual activity: Never  Birth control/protection: Post-menopausal

## 2020-04-25 ENCOUNTER — Encounter: Payer: Self-pay | Admitting: Family Medicine

## 2020-04-25 ENCOUNTER — Other Ambulatory Visit: Payer: Self-pay

## 2020-04-25 ENCOUNTER — Ambulatory Visit: Payer: Self-pay

## 2020-04-25 ENCOUNTER — Ambulatory Visit (INDEPENDENT_AMBULATORY_CARE_PROVIDER_SITE_OTHER): Payer: Medicare Other | Admitting: Family Medicine

## 2020-04-25 DIAGNOSIS — M25552 Pain in left hip: Secondary | ICD-10-CM | POA: Diagnosis not present

## 2020-04-25 NOTE — Progress Notes (Signed)
Office Visit Note   Patient: Lindsay [name]           Date of Birth: 1946/02/24           MRN: [id number] Visit Date: 04/25/2020 Requested by: Lorrene Reid, PA-C [place],  [place] [address] PCP: Lorrene Reid, PA-C  Subjective: Chief Complaint  Patient presents with  . Left Hip - Pain    Anterior and lateral pain x years. Has had that injected before. Unsure if it helped much because she broke her foot shortly after and the boot she wore threw her hips off-kilter. Would like to try another injection.    HPI: She is here with recurrent left hip pain.  Injection in August may or may not have helped, shortly afterwards she broke her foot and because she had a walk in a boot, it threw off her gait.  She has continued to have anterior pain.  She is interested in considering replacement this summer.  Meanwhile she would like to try another injection.                ROS:   All other systems were reviewed and are negative.  Objective: Vital Signs: There were no vitals taken for this visit.  Physical Exam:  General:  Alert and oriented, in no acute distress. Pulm:  Breathing unlabored. Psy:  Normal mood, congruent affect.  She has limited passive internal rotation which causes pain in the left hip.  Imaging: US Guided Needle Placement  Result Date: 04/25/2020 Ultrasound guided injection is preferred based studies that show increased duration, increased effect, greater accuracy, decreased procedural pain, increased response rate, and decreased cost with ultrasound guided versus blind injection.   Verbal informed consent obtained.  Time-out conducted.  Noted no overlying erythema, induration, or other signs of local infection. Ultrasound-guided left hip injection: After sterile prep with Betadine, injected 4 cc 0.25% bupivacaine without epinephrine and 6 mg betamethasone using a 22-gauge spinal needle, passing the needle through the iliofemoral ligament into the  femoral head/neck junction.  Injectate seen filling joint capsule.  Good immediate relief.     Assessment & Plan: 1.  Left hip osteoarthritis -Elected to inject again with steroid.  She will follow-up with Dr. Ninfa Linden to discuss hip replacement.     Procedures: No procedures performed        PMFS History: Patient Active Problem List   Diagnosis Date Noted  . High serum low-density lipoprotein (LDL) 11/30/2018  . Vitamin D insufficiency 11/30/2018  . High serum high density lipoprotein (HDL) 11/30/2018  . newer onset Exercise intolerance * 4-6 mo 08/15/2017  . Complaints of transient weakness of bilateral lower extremity 08/15/2017  . Transient episodes of numbness of both lower extremities 08/15/2017  . Atypical nevus of back 08/07/2017  . Reflux esophagitis 08/02/2017  . Persistent dry cough 08/02/2017  . Muscle spasm of lower bilateral back 08/02/2017  . Muscle spasms of neck 08/02/2017  . Hoarseness or changing voice 08/02/2017  . Left knee pain 06/30/2017  . Osteoarthritis of knee 06/20/2017  . Cough in adult 03/07/2017  . Acute maxillary sinusitis 03/07/2017  . Chronic pain of left knee 10/25/2016  . Primary osteoarthritis of both knees 10/18/2016  . Urinary incontinence 10/08/2016  . Chronic pain of both knees 10/08/2016  . Generalized osteoarthritis of multiple sites 10/08/2016  . Vegan diet 08/27/2016  . Thyroid goiter 08/27/2016  . h/o Near syncope 08/20/2013  . h/o chronic Dizziness 08/20/2013  .  Pain in joint, shoulder region 09/26/2012  . Biceps tendinopathy 09/26/2012  . Right wrist pain 09/26/2012  . S/P AV nodal ablation 08/05/2011  . h/o Paroxysmal SVT (supraventricular tachycardia) ([place]) 10/06/2010  . Palpitations 08/26/2010  . Obesity 08/26/2010   Past Medical History:  Diagnosis Date  . Arthritis   . Chest pain   . Heart murmur   . Herpes   . History of hepatitis    and mononucleosis  . Hypothyroidism   . Mononucleosis 1967  .  Multinodular goiter   . Osteopenia   . Palpitations   . S/P AV nodal ablation 08/05/2011  . SVT (supraventricular tachycardia) ([place])   . Thyroid disease    Phreesia 11/23/2019  . Thyroid nodule     Family History  Problem Relation Age of Onset  . Dementia Mother 31       alive  . Transient ischemic attack Mother        hx of; has a pacemaker  . Hypothyroidism Mother   . Other Father 82       old age. Develop CHF the last 4 months  . Other Sister 67       died of thymoma  . Hypothyroidism Sister   . Other Maternal Aunt        nasal tumor  . Heart attack Maternal Grandfather   . Hypothyroidism Maternal Aunt   . Stroke Paternal Grandfather   . Colon cancer Neg Hx   . Esophageal cancer Neg Hx   . Pancreatic cancer Neg Hx   . Prostate cancer Neg Hx   . Rectal cancer Neg Hx   . Stomach cancer Neg Hx     Past Surgical History:  Procedure Laterality Date  . BREAST CYST ASPIRATION Bilateral 12/15/2000  . CATARACT EXTRACTION W/ INTRAOCULAR LENS IMPLANT Bilateral 2014  . paragard iud     8/90  . SUPRAVENTRICULAR TACHYCARDIA ABLATION N/A 08/05/2011   Procedure: SUPRAVENTRICULAR TACHYCARDIA ABLATION;  Surgeon: Evans Lance, MD;  Location: [facility] CATH LAB;  Service: Cardiovascular;  Laterality: N/A;  . TONSILLECTOMY     Social History   Occupational History  . Not on file  Tobacco Use  . Smoking status: Former Smoker    Packs/day: 2.00    Years: 2.00    Pack years: 4.00    Types: Cigarettes    Start date: 03/01/1964    Quit date: 03/01/1970    Years since quitting: 50.1  . Smokeless tobacco: Never Used  Vaping Use  . Vaping Use: Never used  Substance and Sexual Activity  . Alcohol use: Yes    Alcohol/week: 0.0 standard drinks    Comment: occasional  . Drug use: No  . Sexual activity: Never    Birth control/protection: Post-menopausal

## 2020-05-16 ENCOUNTER — Ambulatory Visit
Admission: RE | Admit: 2020-05-16 | Discharge: 2020-05-16 | Disposition: A | Discharge status: Home / Self Care | Payer: Medicare Other | Source: Ambulatory Visit | Attending: Obstetrics & Gynecology | Admitting: Obstetrics & Gynecology

## 2020-05-16 ENCOUNTER — Other Ambulatory Visit: Payer: Self-pay

## 2020-05-16 DIAGNOSIS — Z1231 Encounter for screening mammogram for malignant neoplasm of breast: Secondary | ICD-10-CM

## 2020-05-19 ENCOUNTER — Other Ambulatory Visit: Payer: Self-pay

## 2020-05-19 ENCOUNTER — Ambulatory Visit: Payer: Self-pay

## 2020-05-19 ENCOUNTER — Ambulatory Visit (INDEPENDENT_AMBULATORY_CARE_PROVIDER_SITE_OTHER): Payer: Medicare Other | Admitting: Orthopaedic Surgery

## 2020-05-19 DIAGNOSIS — M25552 Pain in left hip: Secondary | ICD-10-CM | POA: Diagnosis not present

## 2020-05-19 NOTE — Progress Notes (Signed)
Office Visit Note   Patient: Lindsay [name]           Date of Birth: March 23, 1945           MRN: [id number] Visit Date: 05/19/2020              Requested by: Lorrene Reid, PA-C [place],  [place] [address] PCP: Lorrene Reid, PA-C   Assessment & Plan: Visit Diagnoses:  1. Pain in left hip     Plan: Prior to recommending a hip replacement, I would like to obtain MRI of the left hip to fully assess the cartilage and look for any other evidence of significant issues with that left hip that would make [place] lean toward hip replacement surgery more than other treatment modalities.  She agrees with this treatment plan.  Having exhausted all conservative treatment measures I think this is the next appropriate step as does she.  Follow-Up Instructions: No follow-ups on file.   Orders:  Orders Placed This Encounter  Procedures  . XR HIP UNILAT W OR W/O PELVIS 2-3 VIEWS LEFT   No orders of the defined types were placed in this encounter.     Procedures: No procedures performed   Clinical Data: No additional findings.   Subjective: Chief Complaint  Patient presents with  . Left Hip - Pain  The patient is a very pleasant 75 year old female who is sent for evaluation treatment of left hip pain and suspected osteoarthritis of left hip.  She has had an intra-articular steroid injection which did ease her pain for some time.  She has had these at least a few times now.  It does hurt in her groin and to the superior lateral aspect of her left hip.  Her mother has a history of joint replacements as well.  She says is become extremely painful with pivoting activities and it is definitely affecting her mobility, her quality of life and her actives daily living.  Previous x-rays still showed a well-maintained hip joint space.  I want to get new x-rays today and get a good evaluation of her left hip before recommending hip replacement surgery.  HPI  Review of  Systems She currently denies any headache, chest pain, shortness of breath, fever, chills, nausea, vomiting  Objective: Vital Signs: There were no vitals taken for this visit.  Physical Exam She is alert and orient x3 and in no acute distress Ortho Exam examination of her left hip shows fluid internal extra rotation but pain in the groin and pain the lateral aspect of her hip with rotation.  There is no blocks to rotation.  There is no leg length discrepancy. Specialty Comments:  No specialty comments available.  Imaging: XR HIP UNILAT W OR W/O PELVIS 2-3 VIEWS LEFT  Result Date: 05/19/2020 An AP pelvis and lateral left hip shows slight central narrowing of the hip joint surface as well as superior lateral narrowing compared to the right side.  There is a small osteophyte inferiorly as well.  This narrowing is slightly more than x-rays obtained 6 months ago.    PMFS History: Patient Active Problem List   Diagnosis Date Noted  . High serum low-density lipoprotein (LDL) 11/30/2018  . Vitamin D insufficiency 11/30/2018  . High serum high density lipoprotein (HDL) 11/30/2018  . newer onset Exercise intolerance * 4-6 mo 08/15/2017  . Complaints of transient weakness of bilateral lower extremity 08/15/2017  . Transient episodes of numbness of both lower extremities 08/15/2017  .  Atypical nevus of back 08/07/2017  . Reflux esophagitis 08/02/2017  . Persistent dry cough 08/02/2017  . Muscle spasm of lower bilateral back 08/02/2017  . Muscle spasms of neck 08/02/2017  . Hoarseness or changing voice 08/02/2017  . Left knee pain 06/30/2017  . Osteoarthritis of knee 06/20/2017  . Cough in adult 03/07/2017  . Acute maxillary sinusitis 03/07/2017  . Chronic pain of left knee 10/25/2016  . Primary osteoarthritis of both knees 10/18/2016  . Urinary incontinence 10/08/2016  . Chronic pain of both knees 10/08/2016  . Generalized osteoarthritis of multiple sites 10/08/2016  . Vegan diet  08/27/2016  . Thyroid goiter 08/27/2016  . h/o Near syncope 08/20/2013  . h/o chronic Dizziness 08/20/2013  . Pain in joint, shoulder region 09/26/2012  . Biceps tendinopathy 09/26/2012  . Right wrist pain 09/26/2012  . S/P AV nodal ablation 08/05/2011  . h/o Paroxysmal SVT (supraventricular tachycardia) ([place]) 10/06/2010  . Palpitations 08/26/2010  . Obesity 08/26/2010   Past Medical History:  Diagnosis Date  . Arthritis   . Chest pain   . Heart murmur   . Herpes   . History of hepatitis    and mononucleosis  . Hypothyroidism   . Mononucleosis 1967  . Multinodular goiter   . Osteopenia   . Palpitations   . S/P AV nodal ablation 08/05/2011  . SVT (supraventricular tachycardia) ([place])   . Thyroid disease    Phreesia 11/23/2019  . Thyroid nodule     Family History  Problem Relation Age of Onset  . Dementia Mother 70       alive  . Transient ischemic attack Mother        hx of; has a pacemaker  . Hypothyroidism Mother   . Other Father 40       old age. Develop CHF the last 4 months  . Other Sister 74       died of thymoma  . Hypothyroidism Sister   . Other Maternal Aunt        nasal tumor  . Heart attack Maternal Grandfather   . Hypothyroidism Maternal Aunt   . Stroke Paternal Grandfather   . Colon cancer Neg Hx   . Esophageal cancer Neg Hx   . Pancreatic cancer Neg Hx   . Prostate cancer Neg Hx   . Rectal cancer Neg Hx   . Stomach cancer Neg Hx     Past Surgical History:  Procedure Laterality Date  . BREAST CYST ASPIRATION Bilateral 12/15/2000  . CATARACT EXTRACTION W/ INTRAOCULAR LENS IMPLANT Bilateral 2014  . paragard iud     8/90  . SUPRAVENTRICULAR TACHYCARDIA ABLATION N/A 08/05/2011   Procedure: SUPRAVENTRICULAR TACHYCARDIA ABLATION;  Surgeon: Evans Lance, MD;  Location: [facility] CATH LAB;  Service: Cardiovascular;  Laterality: N/A;  . TONSILLECTOMY     Social History   Occupational History  . Not on file  Tobacco Use  . Smoking status: Former Smoker     Packs/day: 2.00    Years: 2.00    Pack years: 4.00    Types: Cigarettes    Start date: 03/01/1964    Quit date: 03/01/1970    Years since quitting: 50.2  . Smokeless tobacco: Never Used  Vaping Use  . Vaping Use: Never used  Substance and Sexual Activity  . Alcohol use: Yes    Alcohol/week: 0.0 standard drinks    Comment: occasional  . Drug use: No  . Sexual activity: Never    Birth control/protection: Post-menopausal

## 2020-05-21 ENCOUNTER — Other Ambulatory Visit: Payer: Self-pay

## 2020-05-21 DIAGNOSIS — M25552 Pain in left hip: Secondary | ICD-10-CM

## 2020-05-26 ENCOUNTER — Encounter: Payer: Self-pay | Admitting: Physician Assistant

## 2020-05-26 ENCOUNTER — Other Ambulatory Visit: Payer: Self-pay

## 2020-05-26 ENCOUNTER — Ambulatory Visit (INDEPENDENT_AMBULATORY_CARE_PROVIDER_SITE_OTHER): Payer: Medicare Other | Admitting: Physician Assistant

## 2020-05-26 VITALS — BP 119/61 | HR 78 | Temp 97.9°F | Ht 63.0 in | Wt 167.5 lb

## 2020-05-26 DIAGNOSIS — E559 Vitamin D deficiency, unspecified: Secondary | ICD-10-CM

## 2020-05-26 DIAGNOSIS — R5383 Other fatigue: Secondary | ICD-10-CM | POA: Diagnosis not present

## 2020-05-26 DIAGNOSIS — M2550 Pain in unspecified joint: Secondary | ICD-10-CM | POA: Diagnosis not present

## 2020-05-26 NOTE — Progress Notes (Signed)
Acute Office Visit  Subjective:    Patient ID: Lindsay [name], female    DOB: 25-Nov-1945, 75 y.o.   MRN: [id number]  Chief Complaint  Patient presents with  . Follow-up    Fatigue    HPI Patient is in today for c/o fatigue and joint pain.  Patient is concerned if potentially recurrent tick bites are contributing to her fatigue which has been on and off for a while but has worsened the past 6 months. States last tick bite was November 2021 which was evaluated by [facility] given she was not able to get an appointment with our office. Was not treated with antibiotics. Also reports feeling achy all over and is being followed by orthopedics for left hip pain. States orthopedics obtained imaging of the left hip with an x-ray and patient is going to be scheduled for MRI for further evaluation given x-ray showed good joint space. Is taking Aleve frequently for pain relief. Reports very mild shortness of breath with exertion.  Denies chest pain, palpitations, nausea, vomiting, abdominal pain, rash, or cognition changes.  Patient is also inquiring about a female urologist, feels more comfortable. Completed pelvic floor therapy which was not very helpful and has tried different medications for urinary incontinence with minimal success.   Past Medical History:  Diagnosis Date  . Arthritis   . Chest pain   . Heart murmur   . Herpes   . History of hepatitis    and mononucleosis  . Hypothyroidism   . Mononucleosis 1967  . Multinodular goiter   . Osteopenia   . Palpitations   . S/P AV nodal ablation 08/05/2011  . SVT (supraventricular tachycardia) ([place])   . Thyroid disease    Phreesia 11/23/2019  . Thyroid nodule     Past Surgical History:  Procedure Laterality Date  . BREAST CYST ASPIRATION Bilateral 12/15/2000  . CATARACT EXTRACTION W/ INTRAOCULAR LENS IMPLANT Bilateral 2014  . paragard iud     8/90  . SUPRAVENTRICULAR TACHYCARDIA ABLATION N/A 08/05/2011   Procedure:  SUPRAVENTRICULAR TACHYCARDIA ABLATION;  Surgeon: Evans Lance, MD;  Location: [name] CATH LAB;  Service: Cardiovascular;  Laterality: N/A;  . TONSILLECTOMY      Family History  Problem Relation Age of Onset  . Dementia Mother 32       alive  . Transient ischemic attack Mother        hx of; has a pacemaker  . Hypothyroidism Mother   . Other Father 81       old age. Develop CHF the last 4 months  . Other Sister 58       died of thymoma  . Hypothyroidism Sister   . Other Maternal Aunt        nasal tumor  . Heart attack Maternal Grandfather   . Hypothyroidism Maternal Aunt   . Stroke Paternal Grandfather   . Colon cancer Neg Hx   . Esophageal cancer Neg Hx   . Pancreatic cancer Neg Hx   . Prostate cancer Neg Hx   . Rectal cancer Neg Hx   . Stomach cancer Neg Hx     Social History   Socioeconomic History  . Marital status: Divorced    Spouse name: Not on file  . Number of children: 2  . Years of education: Not on file  . Highest education level: Not on file  Occupational History  . Not on file  Tobacco Use  . Smoking status: Former Smoker  Packs/day: 2.00    Years: 2.00    Pack years: 4.00    Types: Cigarettes    Start date: 03/01/1964    Quit date: 03/01/1970    Years since quitting: 50.2  . Smokeless tobacco: Never Used  Vaping Use  . Vaping Use: Never used  Substance and Sexual Activity  . Alcohol use: Yes    Alcohol/week: 0.0 standard drinks    Comment: occasional  . Drug use: No  . Sexual activity: Never    Birth control/protection: Post-menopausal  Other Topics Concern  . Not on file  Social History Narrative  . Not on file   Social Determinants of Health   Financial Resource Strain: Not on file  Food Insecurity: Not on file  Transportation Needs: Not on file  Physical Activity: Not on file  Stress: Not on file  Social Connections: Not on file  Intimate Partner Violence: Not on file    Outpatient Medications Prior to Visit  Medication Sig  Dispense Refill  . aspirin EC 81 MG tablet Take 81 mg by mouth daily.    . Flaxseed, Linseed, (FLAX SEEDS PO) Take 30 mLs by mouth daily.    . folic acid (FOLVITE) 771 MCG tablet Take 400 mcg by mouth daily.    Marland Kitchen GINSENG PO Take by mouth daily.    Marland Kitchen ibuprofen (ADVIL) 800 MG tablet Take 1 tablet (800 mg total) by mouth every 8 (eight) hours as needed. 60 tablet 2  . levothyroxine (SYNTHROID, LEVOTHROID) 25 MCG tablet Take 1 tablet by mouth daily.  11  . Multiple Vitamin (MULTIVITAMIN) tablet Take 1 tablet by mouth daily.    . sodium fluoride (FLUORISHIELD) 1.1 % GEL dental gel Place onto teeth.    . solifenacin (VESICARE) 5 MG tablet Take 1 tablet (5 mg total) by mouth daily. 30 tablet 11  . tolterodine (DETROL LA) 4 MG 24 hr capsule Take 1 capsule (4 mg total) by mouth daily. 30 capsule 11  . TURMERIC PO Take by mouth daily.    . Vitamin D, Ergocalciferol, (DRISDOL) 1.25 MG (50000 UNIT) CAPS capsule Take one tablet wkly 12 capsule 1  . oxybutynin (DITROPAN-XL) 10 MG 24 hr tablet Take 1 tablet (10 mg total) by mouth daily. 30 tablet 11  . Probiotic Product (PROBIOTIC FORMULA PO) Take 1 capsule by mouth daily. New chapter All-Flora. (Patient not taking: Reported on 05/26/2020)     Facility-Administered Medications Prior to Visit  Medication Dose Route Frequency Provider Last Rate Last Admin  . bupivacaine (MARCAINE) 0.5 % injection 2 mL  2 mL Infiltration Once Opalski, Deborah, DO      . lidocaine (XYLOCAINE) 2 % (with pres) injection 20 mg  1 mL Other Once Opalski, Deborah, DO      . methylPREDNISolone acetate (DEPO-MEDROL) injection 40 mg  40 mg Intra-articular Once Opalski, Deborah, DO        No Known Allergies  Review of Systems  A fourteen system review of systems was performed and found to be positive as per HPI.    Objective:    Physical Exam General:  Well Developed, well nourished, in no acute distress Neuro:  Alert and oriented,  extra-ocular muscles intact, CN II-XII grossly  intact  HEENT:  Normocephalic, atraumatic, no adenopathy, neck supple, no carotid bruits appreciated  Skin:  no gross rash, warm, pink. Cardiac:  RRR, S1 S2 wnl's Respiratory:  ECTA B/L w/o wheezing, crackles or rales, Not using accessory muscles, speaking in full sentences- unlabored. Vascular:  Ext warm, no cyanosis apprec.; cap RF less 2 sec. Psych:  No HI/SI, judgement and insight good, Euthymic mood. Full Affect.   BP 119/61   Pulse 78   Temp 97.9 F (36.6 C)   Ht $R'5\' 3"'uk$  (1.6 m)   Wt 167 lb 8 oz (76 kg)   SpO2 97%   BMI 29.67 kg/m  Wt Readings from Last 3 Encounters:  05/26/20 167 lb 8 oz (76 kg)  04/11/20 159 lb (72.1 kg)  12/03/19 159 lb 3.2 oz (72.2 kg)    Health Maintenance Due  Topic Date Due  . COVID-19 Vaccine (1) Never done  . PNA vac Low Risk Adult (2 of 2 - PCV13) 06/23/2014    There are no preventive care reminders to display for this patient.   Lab Results  Component Value Date   TSH 1.050 05/26/2020   Lab Results  Component Value Date   WBC 6.0 05/26/2020   HGB 12.6 05/26/2020   HCT 37.3 05/26/2020   MCV 93 05/26/2020   PLT 278 05/26/2020   Lab Results  Component Value Date   NA 142 05/26/2020   K 4.5 05/26/2020   CO2 19 (L) 05/26/2020   GLUCOSE 100 (H) 05/26/2020   BUN 12 05/26/2020   CREATININE 0.73 05/26/2020   BILITOT 0.4 05/26/2020   ALKPHOS 86 05/26/2020   AST 17 05/26/2020   ALT 21 05/26/2020   PROT 6.7 05/26/2020   ALBUMIN 4.3 05/26/2020   CALCIUM 9.2 05/26/2020   GFR 76.50 07/29/2011   Lab Results  Component Value Date   CHOL 207 (H) 11/26/2019   Lab Results  Component Value Date   HDL 73 11/26/2019   Lab Results  Component Value Date   LDLCALC 122 (H) 11/26/2019   Lab Results  Component Value Date   TRIG 66 11/26/2019   Lab Results  Component Value Date   CHOLHDL 2.8 11/26/2019   Lab Results  Component Value Date   HGBA1C CANCELED 11/20/2018       Assessment & Plan:   Problem List Items Addressed This  Visit      Other   Vitamin D insufficiency   Relevant Orders   VITAMIN D 25 Hydroxy (Vit-D Deficiency, Fractures) (Completed)    Other Visit Diagnoses    Fatigue, unspecified type    -  Primary   Relevant Orders   Comp Met (CMET) (Completed)   CBC w/Diff (Completed)   TSH (Completed)   Sedimentation rate (Completed)   C-reactive protein (Completed)   Antinuclear Antib (ANA) (Completed)   Rheumatoid Factor (Completed)   Lyme Disease, IgM, Early Test w/ Rflx (Completed)   Rocky mtn spotted fvr ab, IgG-blood (Completed)   Polyarthralgia       Relevant Orders   Sedimentation rate (Completed)   C-reactive protein (Completed)   Antinuclear Antib (ANA) (Completed)   Rheumatoid Factor (Completed)   Lyme Disease, IgM, Early Test w/ Rflx (Completed)   Rocky mtn spotted fvr ab, IgG-blood (Completed)     Fatigue, Polyarthralgia: -Etiology unclear. Will place lab orders for further evaluation and assess for potential etiologies such as tick-borne illness sequela, vitamin deficiency, autoimmune, metabolic, hematologic, or endocrine cause. Patient inquired about fibromyalgia and discussed it is a diagnosis of exclusion.  -Pending lab results will determine additional steps. Discussed with patient possible referral to rheumatology and will wait pending labs.    No orders of the defined types were placed in this encounter.    Lorrene Reid, PA-C

## 2020-05-26 NOTE — Patient Instructions (Signed)

## 2020-05-27 LAB — COMPREHENSIVE METABOLIC PANEL
ALT: 21 IU/L (ref 0–32)
AST: 17 IU/L (ref 0–40)
Albumin/Globulin Ratio: 1.8 (ref 1.2–2.2)
Albumin: 4.3 g/dL (ref 3.7–4.7)
Alkaline Phosphatase: 86 IU/L (ref 44–121)
BUN/Creatinine Ratio: 16 (ref 12–28)
BUN: 12 mg/dL (ref 8–27)
Bilirubin Total: 0.4 mg/dL (ref 0.0–1.2)
CO2: 19 mmol/L — ABNORMAL LOW (ref 20–29)
Calcium: 9.2 mg/dL (ref 8.7–10.3)
Chloride: 106 mmol/L (ref 96–106)
Creatinine, Ser: 0.73 mg/dL (ref 0.57–1.00)
Globulin, Total: 2.4 g/dL (ref 1.5–4.5)
Glucose: 100 mg/dL — ABNORMAL HIGH (ref 65–99)
Potassium: 4.5 mmol/L (ref 3.5–5.2)
Sodium: 142 mmol/L (ref 134–144)
Total Protein: 6.7 g/dL (ref 6.0–8.5)
eGFR: 87 mL/min/{1.73_m2} (ref >59)

## 2020-05-27 LAB — CBC WITH DIFFERENTIAL/PLATELET
Basophils Absolute: 0 10*3/uL (ref 0.0–0.2)
Basos: 1 %
EOS (ABSOLUTE): 0.2 10*3/uL (ref 0.0–0.4)
Eos: 4 %
Hematocrit: 37.3 % (ref 34.0–46.6)
Hemoglobin: 12.6 g/dL (ref 11.1–15.9)
Immature Grans (Abs): 0 10*3/uL (ref 0.0–0.1)
Immature Granulocytes: 0 %
Lymphocytes Absolute: 1.7 10*3/uL (ref 0.7–3.1)
Lymphs: 29 %
MCH: 31.3 pg (ref 26.6–33.0)
MCHC: 33.8 g/dL (ref 31.5–35.7)
MCV: 93 fL (ref 79–97)
Monocytes Absolute: 0.7 10*3/uL (ref 0.1–0.9)
Monocytes: 12 %
Neutrophils Absolute: 3.3 10*3/uL (ref 1.4–7.0)
Neutrophils: 54 %
Platelets: 278 10*3/uL (ref 150–450)
RBC: 4.03 x10E6/uL (ref 3.77–5.28)
RDW: 11.9 % (ref 11.7–15.4)
WBC: 6 10*3/uL (ref 3.4–10.8)

## 2020-05-27 LAB — RHEUMATOID FACTOR: Rheumatoid fact SerPl-aCnc: 10.2 IU/mL (ref <14.0)

## 2020-05-27 LAB — TSH: TSH: 1.05 u[IU]/mL (ref 0.450–4.500)

## 2020-05-27 LAB — ANA: Anti Nuclear Antibody (ANA): NEGATIVE

## 2020-05-27 LAB — C-REACTIVE PROTEIN: CRP: <1 mg/L (ref 0–10)

## 2020-05-27 LAB — VITAMIN D 25 HYDROXY (VIT D DEFICIENCY, FRACTURES): Vit D, 25-Hydroxy: 50.3 ng/mL (ref 30.0–100.0)

## 2020-05-27 LAB — SEDIMENTATION RATE: Sed Rate: 13 mm/hr (ref 0–40)

## 2020-05-28 LAB — LYME, IGM, EARLY TEST/REFLEX: LYME DISEASE AB, QUANT, IGM: <0.8 index (ref 0.00–0.79)

## 2020-05-28 LAB — ROCKY MTN SPOTTED FVR AB, IGG-BLOOD: RMSF IgG: NEGATIVE

## 2020-05-30 ENCOUNTER — Telehealth: Payer: Self-pay | Admitting: Physician Assistant

## 2020-05-30 DIAGNOSIS — E559 Vitamin D deficiency, unspecified: Secondary | ICD-10-CM

## 2020-05-30 MED ORDER — VITAMIN D (ERGOCALCIFEROL) 1.25 MG (50000 UNIT) PO CAPS: ORAL_CAPSULE | ORAL | 1 refills | Status: DC

## 2020-05-30 NOTE — Telephone Encounter (Signed)
Refill sent to requested pharmacy. AS, CMA 

## 2020-06-04 ENCOUNTER — Ambulatory Visit (INDEPENDENT_AMBULATORY_CARE_PROVIDER_SITE_OTHER): Payer: Medicare Other | Admitting: Physician Assistant

## 2020-06-04 ENCOUNTER — Encounter: Payer: Self-pay | Admitting: Physician Assistant

## 2020-06-04 ENCOUNTER — Other Ambulatory Visit: Payer: Self-pay

## 2020-06-04 VITALS — BP 120/73 | HR 89 | Temp 97.6°F | Ht 63.0 in | Wt 171.4 lb

## 2020-06-04 DIAGNOSIS — M797 Fibromyalgia: Secondary | ICD-10-CM

## 2020-06-04 DIAGNOSIS — R32 Unspecified urinary incontinence: Secondary | ICD-10-CM | POA: Diagnosis not present

## 2020-06-04 MED ORDER — AMITRIPTYLINE HCL 10 MG PO TABS: 10.0000 mg | ORAL_TABLET | Freq: Every day | ORAL | 2 refills | Status: DC

## 2020-06-04 NOTE — Patient Instructions (Signed)
Myofascial Pain Syndrome and Fibromyalgia Myofascial pain syndrome and fibromyalgia are both pain disorders. This pain may be felt mainly in your muscles.  Myofascial pain syndrome: ? Always has tender points in the muscle that will cause pain when pressed (trigger points). The pain may come and go. ? Usually affects your neck, upper back, and shoulder areas. The pain often radiates into your arms and hands.  Fibromyalgia: ? Has muscle pains and tenderness that come and go. ? Is often associated with fatigue and sleep problems. ? Has trigger points. ? Tends to be long-lasting (chronic), but is not life-threatening. Fibromyalgia and myofascial pain syndrome are not the same. However, they often occur together. If you have both conditions, each can make the other worse. Both are common and can cause enough pain and fatigue to make day-to-day activities difficult. Both can be hard to diagnose because their symptoms are common in many other conditions. What are the causes? The exact causes of these conditions are not known. What increases the risk? You are more likely to develop this condition if:  You have a family history of the condition.  You have certain triggers, such as: ? Spine disorders. ? An injury (trauma) or other physical stressors. ? Being under a lot of stress. ? Medical conditions such as osteoarthritis, rheumatoid arthritis, or lupus. What are the signs or symptoms? Fibromyalgia The main symptom of fibromyalgia is widespread pain and tenderness in your muscles. Pain is sometimes described as stabbing, shooting, or burning. You may also have:  Tingling or numbness.  Sleep problems and fatigue.  Problems with attention and concentration (fibro fog). Other symptoms may include:  Bowel and bladder problems.  Headaches.  Visual problems.  Problems with odors and noises.  Depression or mood changes.  Painful menstrual periods (dysmenorrhea).  Dry skin or  eyes. These symptoms can vary over time. Myofascial pain syndrome Symptoms of myofascial pain syndrome include:  Tight, ropy bands of muscle.  Uncomfortable sensations in muscle areas. These may include aching, cramping, burning, numbness, tingling, and weakness.  Difficulty moving certain parts of the body freely (poor range of motion). How is this diagnosed? This condition may be diagnosed by your symptoms and medical history. You will also have a physical exam. In general:  Fibromyalgia is diagnosed if you have pain, fatigue, and other symptoms for more than 3 months, and symptoms cannot be explained by another condition.  Myofascial pain syndrome is diagnosed if you have trigger points in your muscles, and those trigger points are tender and cause pain elsewhere in your body (referred pain). How is this treated? Treatment for these conditions depends on the type that you have.  For fibromyalgia: ? Pain medicines, such as NSAIDs. ? Medicines for treating depression. ? Medicines for treating seizures. ? Medicines that relax the muscles.  For myofascial pain: ? Pain medicines, such as NSAIDs. ? Cooling and stretching of muscles. ? Trigger point injections. ? Sound wave (ultrasound) treatments to stimulate muscles. Treating these conditions often requires a team of health care providers. These may include:  Your primary care provider.  Physical therapist.  Complementary health care providers, such as massage therapists or acupuncturists.  Psychiatrist for cognitive behavioral therapy.   Follow these instructions at home: Medicines  Take over-the-counter and prescription medicines only as told by your health care provider.  Do not drive or use heavy machinery while taking prescription pain medicine.  If you are taking prescription pain medicine, take actions to prevent or treat constipation. Your  health care provider may recommend that you: ? Drink enough fluid to keep  your urine pale yellow. ? Eat foods that are high in fiber, such as fresh fruits and vegetables, whole grains, and beans. ? Limit foods that are high in fat and processed sugars, such as fried or sweet foods. ? Take an over-the-counter or prescription medicine for constipation. Lifestyle  Exercise as directed by your health care provider or physical therapist.  Practice relaxation techniques to control your stress. You may want to try: ? Biofeedback. ? Visual imagery. ? Hypnosis. ? Muscle relaxation. ? Yoga. ? Meditation.  Maintain a healthy lifestyle. This includes eating a healthy diet and getting enough sleep.  Do not use any products that contain nicotine or tobacco, such as cigarettes and e-cigarettes. If you need help quitting, ask your health care provider.   General instructions  Talk to your health care provider about complementary treatments, such as acupuncture or massage.  Consider joining a support group with others who are diagnosed with this condition.  Do not do activities that stress or strain your muscles. This includes repetitive motions and heavy lifting.  Keep all follow-up visits as told by your health care provider. This is important. Where to find more information  National Fibromyalgia Association: www.fmaware.[place]: www.arthritis.org  American Chronic Pain Association: www.theacpa.org Contact a health care provider if:  You have new symptoms.  Your symptoms get worse or your pain is severe.  You have side effects from your medicines.  You have trouble sleeping.  Your condition is causing depression or anxiety. Summary  Myofascial pain syndrome and fibromyalgia are pain disorders.  Myofascial pain syndrome has tender points in the muscle that will cause pain when pressed (trigger points). Fibromyalgia also has muscle pains and tenderness that come and go, but this condition is often associated with fatigue and sleep  disturbances.  Fibromyalgia and myofascial pain syndrome are not the same but often occur together, causing pain and fatigue that make day-to-day activities difficult.  Treatment for fibromyalgia includes taking medicines to relax the muscles and medicines for pain, depression, or seizures. Treatment for myofascial pain syndrome includes taking medicines for pain, cooling and stretching of muscles, and injecting medicines into trigger points.  Follow your health care provider's instructions for taking medicines and maintaining a healthy lifestyle. This information is not intended to replace advice given to you by your health care provider. Make sure you discuss any questions you have with your health care provider. Document Revised: 06/09/2018 Document Reviewed: 03/02/2017 Elsevier Patient Education  2021 [employer].

## 2020-06-04 NOTE — Progress Notes (Signed)
Established Patient Office Visit  Subjective:  Patient ID: Lindsay [name], female    DOB: 1945/03/25  Age: 75 y.o. MRN: [id number]  CC:  Chief Complaint  Patient presents with  . Follow-up    HPI Euphemia Lingerfelt Efaw presents for follow up on polyarthralgia. Discussed with patient labs recently obtained were unremarkable. Patient states symptoms of fatigue and diffuse joint and muscle pain (hands, forearms, shoulders, back, legs, hip and neck) are about the same. Reports new symptom of muscle weakness of lower extremity which was a symptom that was present prior but had improved and resolved until recently. Sometimes has trouble with sleep disturbance but not usually. States has arthritis of her spine and her hip MRI is scheduled for May.   Past Medical History:  Diagnosis Date  . Arthritis   . Chest pain   . Heart murmur   . Herpes   . History of hepatitis    and mononucleosis  . Hypothyroidism   . Mononucleosis 1967  . Multinodular goiter   . Osteopenia   . Palpitations   . S/P AV nodal ablation 08/05/2011  . SVT (supraventricular tachycardia) ([place])   . Thyroid disease    Phreesia 11/23/2019  . Thyroid nodule     Past Surgical History:  Procedure Laterality Date  . BREAST CYST ASPIRATION Bilateral 12/15/2000  . CATARACT EXTRACTION W/ INTRAOCULAR LENS IMPLANT Bilateral 2014  . paragard iud     8/90  . SUPRAVENTRICULAR TACHYCARDIA ABLATION N/A 08/05/2011   Procedure: SUPRAVENTRICULAR TACHYCARDIA ABLATION;  Surgeon: Evans Lance, MD;  Location: [facility] CATH LAB;  Service: Cardiovascular;  Laterality: N/A;  . TONSILLECTOMY      Family History  Problem Relation Age of Onset  . Dementia Mother 66       alive  . Transient ischemic attack Mother        hx of; has a pacemaker  . Hypothyroidism Mother   . Other Father 6       old age. Develop CHF the last 4 months  . Other Sister 63       died of thymoma  . Hypothyroidism Sister   . Other Maternal Aunt        nasal tumor  .  Heart attack Maternal Grandfather   . Hypothyroidism Maternal Aunt   . Stroke Paternal Grandfather   . Colon cancer Neg Hx   . Esophageal cancer Neg Hx   . Pancreatic cancer Neg Hx   . Prostate cancer Neg Hx   . Rectal cancer Neg Hx   . Stomach cancer Neg Hx     Social History   Socioeconomic History  . Marital status: Divorced    Spouse name: Not on file  . Number of children: 2  . Years of education: Not on file  . Highest education level: Not on file  Occupational History  . Not on file  Tobacco Use  . Smoking status: Former Smoker    Packs/day: 2.00    Years: 2.00    Pack years: 4.00    Types: Cigarettes    Start date: 03/01/1964    Quit date: 03/01/1970    Years since quitting: 50.2  . Smokeless tobacco: Never Used  Vaping Use  . Vaping Use: Never used  Substance and Sexual Activity  . Alcohol use: Yes    Alcohol/week: 0.0 standard drinks    Comment: occasional  . Drug use: No  . Sexual activity: Never    Birth control/protection: Post-menopausal  Other  Topics Concern  . Not on file  Social History Narrative  . Not on file   Social Determinants of Health   Financial Resource Strain: Not on file  Food Insecurity: Not on file  Transportation Needs: Not on file  Physical Activity: Not on file  Stress: Not on file  Social Connections: Not on file  Intimate Partner Violence: Not on file    Outpatient Medications Prior to Visit  Medication Sig Dispense Refill  . aspirin EC 81 MG tablet Take 81 mg by mouth daily.    . Flaxseed, Linseed, (FLAX SEEDS PO) Take 30 mLs by mouth daily.    . folic acid (FOLVITE) 314 MCG tablet Take 400 mcg by mouth daily.    Marland Kitchen GINSENG PO Take by mouth daily.    Marland Kitchen ibuprofen (ADVIL) 800 MG tablet Take 1 tablet (800 mg total) by mouth every 8 (eight) hours as needed. 60 tablet 2  . levothyroxine (SYNTHROID, LEVOTHROID) 25 MCG tablet Take 1 tablet by mouth daily.  11  . Multiple Vitamin (MULTIVITAMIN) tablet Take 1 tablet by mouth  daily.    . Probiotic Product (PROBIOTIC FORMULA PO) Take 1 capsule by mouth daily. New chapter All-Flora.    . sodium fluoride (FLUORISHIELD) 1.1 % GEL dental gel Place onto teeth.    . tolterodine (DETROL LA) 4 MG 24 hr capsule Take 1 capsule (4 mg total) by mouth daily. 30 capsule 11  . TURMERIC PO Take by mouth daily.    . Vitamin D, Ergocalciferol, (DRISDOL) 1.25 MG (50000 UNIT) CAPS capsule Take one tablet wkly 12 capsule 1  . oxybutynin (DITROPAN-XL) 10 MG 24 hr tablet Take 1 tablet (10 mg total) by mouth daily. 30 tablet 11  . solifenacin (VESICARE) 5 MG tablet Take 1 tablet (5 mg total) by mouth daily. (Patient not taking: Reported on 06/04/2020) 30 tablet 11   Facility-Administered Medications Prior to Visit  Medication Dose Route Frequency Provider Last Rate Last Admin  . bupivacaine (MARCAINE) 0.5 % injection 2 mL  2 mL Infiltration Once Opalski, Deborah, DO      . lidocaine (XYLOCAINE) 2 % (with pres) injection 20 mg  1 mL Other Once Opalski, Deborah, DO      . methylPREDNISolone acetate (DEPO-MEDROL) injection 40 mg  40 mg Intra-articular Once Opalski, Deborah, DO        No Known Allergies  ROS Review of Systems  A fourteen system review of systems was performed and found to be positive as per HPI.   Objective:    Physical Exam General:  Well Developed, well nourished, appropriate for stated age.  Neuro:  Alert and oriented,  extra-ocular muscles intact  HEENT:  Normocephalic, atraumatic, neck supple Skin:  no gross rash, warm, pink. Cardiac:  RRR, S1 S2 Respiratory:  ECTA B/L and A/P, Not using accessory muscles, speaking in full sentences- unlabored. MSK: Good ROM and strength, mild tenderness of left forearm, bilateral shoulders, triceps area.  Vascular:  Ext warm, no cyanosis apprec.; cap RF less 2 sec. Psych:  No HI/SI, judgement and insight good, Euthymic mood. Full Affect.   BP 120/73   Pulse 89   Temp 97.6 F (36.4 C)   Ht 5\' 3"  (1.6 m)   Wt 171 lb 6.4 oz  (77.7 kg)   SpO2 96%   BMI 30.36 kg/m  Wt Readings from Last 3 Encounters:  06/04/20 171 lb 6.4 oz (77.7 kg)  05/26/20 167 lb 8 oz (76 kg)  04/11/20 159 lb (72.1 kg)  Health Maintenance Due  Topic Date Due  . COVID-19 Vaccine (1) Never done  . PNA vac Low Risk Adult (2 of 2 - PCV13) 06/23/2014    There are no preventive care reminders to display for this patient.  Lab Results  Component Value Date   TSH 1.050 05/26/2020   Lab Results  Component Value Date   WBC 6.0 05/26/2020   HGB 12.6 05/26/2020   HCT 37.3 05/26/2020   MCV 93 05/26/2020   PLT 278 05/26/2020   Lab Results  Component Value Date   NA 142 05/26/2020   K 4.5 05/26/2020   CO2 19 (L) 05/26/2020   GLUCOSE 100 (H) 05/26/2020   BUN 12 05/26/2020   CREATININE 0.73 05/26/2020   BILITOT 0.4 05/26/2020   ALKPHOS 86 05/26/2020   AST 17 05/26/2020   ALT 21 05/26/2020   PROT 6.7 05/26/2020   ALBUMIN 4.3 05/26/2020   CALCIUM 9.2 05/26/2020   GFR 76.50 07/29/2011   Lab Results  Component Value Date   CHOL 207 (H) 11/26/2019   Lab Results  Component Value Date   HDL 73 11/26/2019   Lab Results  Component Value Date   LDLCALC 122 (H) 11/26/2019   Lab Results  Component Value Date   TRIG 66 11/26/2019   Lab Results  Component Value Date   CHOLHDL 2.8 11/26/2019   Lab Results  Component Value Date   HGBA1C CANCELED 11/20/2018      Assessment & Plan:   Problem List Items Addressed This Visit      Other   Urinary incontinence   Relevant Orders   Ambulatory referral to Urology    Other Visit Diagnoses    Fibromyalgia    -  Primary   Relevant Medications   amitriptyline (ELAVIL) 10 MG tablet     Fibromyalgia: -Discussed with patient meets clinical diagnosis for fibromyalgia (widespread pain and fatigue) and lab work up negative. Discussed pathophysiology and management options including medications and potential side effects. Patient is agreeable to trial low dose amitriptyline so  will send rx for 10 mg. Advised if unable to tolerate medication or dose then to let me know and we can try 5 mg (take 0.5 tablet of 10 mg). Patient verbalized understanding.   -Recommend to continue to stay active, aerobic exercise/yoga and stretches.  -Follow up in 2-3 months to reassess symptoms and medication therapy.   Urinary incontinence: -Will place new referral to Urogynecologist Dr. Maryland Pink with [facility] ordered this encounter  Medications  . amitriptyline (ELAVIL) 10 MG tablet    Sig: Take 1 tablet (10 mg total) by mouth at bedtime.    Dispense:  30 tablet    Refill:  2    Order Specific Question:   Supervising Provider    Answer:   Beatrice Lecher D [[id number]]    Follow-up: Return for Fibromyalgia in 2-3 months .    Lorrene Reid, PA-C

## 2020-06-11 ENCOUNTER — Ambulatory Visit: Payer: Medicare Other | Admitting: Physician Assistant

## 2020-06-23 ENCOUNTER — Other Ambulatory Visit: Payer: Self-pay | Admitting: Orthopaedic Surgery

## 2020-06-23 ENCOUNTER — Telehealth: Payer: Self-pay | Admitting: Physician Assistant

## 2020-06-23 ENCOUNTER — Telehealth: Payer: Self-pay | Admitting: Orthopaedic Surgery

## 2020-06-23 MED ORDER — DIAZEPAM 5 MG PO TABS: 5.0000 mg | ORAL_TABLET | Freq: Once | ORAL | 0 refills | Status: DC | PRN

## 2020-06-23 NOTE — Telephone Encounter (Signed)
In regards to the medication for anxiety during MRI, patient should reach out to ordering providers office to request, or she can schedule a telehealth or in person apt to discuss this with provider in our office.   In regards to Urology referral, please have patient reach out to their office if she has not answered their phone calls.  Referral was sent to     Dr. Maryland Pink with [facility]

## 2020-06-23 NOTE — Telephone Encounter (Signed)
Patient contacted and made aware. 

## 2020-06-23 NOTE — Telephone Encounter (Signed)
Patient called requesting a prescription for valuim for upcoming appt. She is asking for low dosage in mg just to take the end off her nerves. Please send to pharmacy on file. Please call patient at [phone].

## 2020-06-23 NOTE — Telephone Encounter (Signed)
Patient has an MRI coming up on May 2 ND and would like something to help her relax during the MRI. Patient has been using Fibromyalgia and it is not helping much. Some days it is better than others as far as that medication goes. Patient had been referred to a urologist and would like to know if she needs a new referral, she is not sure if they have called. Patient states she does answer calls unless she knows the number.Please have urologist office leave message stating who they are. Please advise, thanks.

## 2020-06-23 NOTE — Telephone Encounter (Signed)
Please advise 

## 2020-06-30 ENCOUNTER — Ambulatory Visit
Admission: RE | Admit: 2020-06-30 | Discharge: 2020-06-30 | Disposition: A | Discharge status: Home / Self Care | Payer: Medicare Other | Source: Ambulatory Visit | Attending: Orthopaedic Surgery | Admitting: Orthopaedic Surgery

## 2020-06-30 ENCOUNTER — Other Ambulatory Visit: Payer: Self-pay

## 2020-06-30 DIAGNOSIS — M25552 Pain in left hip: Secondary | ICD-10-CM

## 2020-06-30 DIAGNOSIS — S73192A Other sprain of left hip, initial encounter: Secondary | ICD-10-CM | POA: Diagnosis not present

## 2020-06-30 DIAGNOSIS — G8929 Other chronic pain: Secondary | ICD-10-CM | POA: Diagnosis not present

## 2020-06-30 DIAGNOSIS — M1612 Unilateral primary osteoarthritis, left hip: Secondary | ICD-10-CM | POA: Diagnosis not present

## 2020-06-30 DIAGNOSIS — S76312A Strain of muscle, fascia and tendon of the posterior muscle group at thigh level, left thigh, initial encounter: Secondary | ICD-10-CM | POA: Diagnosis not present

## 2020-07-07 ENCOUNTER — Encounter: Payer: Self-pay | Admitting: Physician Assistant

## 2020-07-07 ENCOUNTER — Ambulatory Visit (INDEPENDENT_AMBULATORY_CARE_PROVIDER_SITE_OTHER): Payer: Medicare Other | Admitting: Physician Assistant

## 2020-07-07 DIAGNOSIS — M1612 Unilateral primary osteoarthritis, left hip: Secondary | ICD-10-CM

## 2020-07-07 NOTE — Progress Notes (Signed)
HPI: Mrs. Preciado returns today for follow-up of her left hip status post MRI.  She continues to have pain in the left hip.  She states her pain is constant.  She has trouble transitioning from seated to standing positions standing to seated position.  She notes she limps.  She uses no assistive devices.  She has had intra-articular injections in the hip but despite this continues to have significant pain.  She feels that her hip pain is becoming worse.  MRI left hip without contrast dated 07/01/2020 showed severe end-stage arthritis left hip.  There periarticular osteophytes and patchy areas of subchondral bone marrow edema within the left femoral head.  Small joint effusion.  MRI images were reviewed with her.  Review of systems: Denies any fevers chills shortness of breath chest pain.  General: Well-developed well-nourished female no acute distress mood and affect appropriate.  Ambulates without any assistive device.  Has difficulty transitioning from seated to standing position.  Impression: End-stage osteoarthritis left hip  Plan: Due to that the patient is failed conservative treatment which is included time, activity modification and injections.  Along with the severe arthritis seen on MRI.  Recommend left total hip arthroplasty.  Risk benefits of surgery discussed with patient.  Risk include but there not limited to nerve vessel injury, infection, DVT/PE, wound healing problems, prolonged pain and worsening pain.  Hip prosthesis components shown to the patient.  Discussed the postoperative protocol.  Questions were encouraged and answered at length.  She would like to discuss this with her daughter and will call Samella Parr once she is ready to schedule surgery.  We will see her back 2 weeks postop.

## 2020-07-15 ENCOUNTER — Telehealth: Payer: Self-pay

## 2020-07-15 NOTE — Telephone Encounter (Signed)
Patient is being scheduled for left THA on 08/29/20.  She has some questions about surgery and post op. Please call [phone]

## 2020-07-16 ENCOUNTER — Telehealth: Payer: Self-pay

## 2020-07-16 NOTE — Telephone Encounter (Signed)
Patient called she is returning a phone call she stated if it was Dr.Blackman that called he can try reaching her on Friday due to her being home all day call back:[phone]

## 2020-07-31 ENCOUNTER — Other Ambulatory Visit: Payer: Self-pay

## 2020-08-11 ENCOUNTER — Encounter: Payer: Self-pay | Admitting: Physician Assistant

## 2020-08-11 ENCOUNTER — Other Ambulatory Visit: Payer: Self-pay

## 2020-08-11 ENCOUNTER — Ambulatory Visit (INDEPENDENT_AMBULATORY_CARE_PROVIDER_SITE_OTHER): Payer: Medicare Other | Admitting: Physician Assistant

## 2020-08-11 VITALS — BP 135/74 | HR 76 | Temp 97.5°F | Ht 63.0 in | Wt 161.1 lb

## 2020-08-11 DIAGNOSIS — R29898 Other symptoms and signs involving the musculoskeletal system: Secondary | ICD-10-CM

## 2020-08-11 DIAGNOSIS — M797 Fibromyalgia: Secondary | ICD-10-CM

## 2020-08-11 MED ORDER — AMITRIPTYLINE HCL 10 MG PO TABS: 10.0000 mg | ORAL_TABLET | Freq: Every day | ORAL | 2 refills | Status: DC

## 2020-08-11 NOTE — Progress Notes (Signed)
Established Patient Office Visit  Subjective:  Patient ID: Lindsay [name], female    DOB: [birth year]  Age: 75 y.o. MRN: [id number]  CC:  Chief Complaint  Patient presents with   Medication Refill   Follow-up    HPI Lindsay [name] presents for follow up on medication management. Patient states taking amitriptyline  as directed. Reports having trouble with waking up in the mornings, otherwise no significant side effects. States unsure if medication has been effective, has not noticed a significant difference with fatigue or myalgia/arthralgia. Patient recently had MRI of left hip which revealed severe osteoarthritis and is planning to have left hip replacement. States knee injections help with her knee pain and is trying to avoid knee surgery as a last resort. Patient reports a history of random infrequent episodes where she feels she is going to collapse (transient lower extremity weakness) and has been evaluate by cardiology in the past and her work-up was negative. She did not get to perform stress test due to the pandemic. Reports since starting amitriptyline has noticed her urinary urgency has mildly improved.   Past Medical History:  Diagnosis Date   Arthritis    Chest pain    Heart murmur    Herpes    History of hepatitis    and mononucleosis   Hypothyroidism    Mononucleosis 1967   Multinodular goiter    Osteopenia    Palpitations    S/P AV nodal ablation 08/05/2011   SVT (supraventricular tachycardia) ([facility])    Thyroid disease    Phreesia 11/23/2019   Thyroid nodule     Past Surgical History:  Procedure Laterality Date   BREAST CYST ASPIRATION Bilateral 12/15/2000   CATARACT EXTRACTION W/ INTRAOCULAR LENS IMPLANT Bilateral 2014   paragard iud     8/90   SUPRAVENTRICULAR TACHYCARDIA ABLATION N/A 08/05/2011   Procedure: SUPRAVENTRICULAR TACHYCARDIA ABLATION;  Surgeon: Evans Lance, MD;  Location: [facility] CATH LAB;  Service: Cardiovascular;  Laterality: N/A;   TONSILLECTOMY       Family History  Problem Relation Age of Onset   Dementia Mother 57       alive   Transient ischemic attack Mother        hx of; has a pacemaker   Hypothyroidism Mother    Other Father 6       old age. Develop CHF the last 4 months   Other Sister 9       died of thymoma   Hypothyroidism Sister    Other Maternal Aunt        nasal tumor   Heart attack Maternal Grandfather    Hypothyroidism Maternal Aunt    Stroke Paternal Grandfather    Colon cancer Neg Hx    Esophageal cancer Neg Hx    Pancreatic cancer Neg Hx    Prostate cancer Neg Hx    Rectal cancer Neg Hx    Stomach cancer Neg Hx     Social History   Socioeconomic History   Marital status: Divorced    Spouse name: Not on file   Number of children: 2   Years of education: Not on file   Highest education level: Not on file  Occupational History   Not on file  Tobacco Use   Smoking status: Former    Packs/day: 2.00    Years: 2.00    Pack years: 4.00    Types: Cigarettes    Start date: 03/01/1964    Quit date: 03/01/1970  Years since quitting: 50.4   Smokeless tobacco: Never  Vaping Use   Vaping Use: Never used  Substance and Sexual Activity   Alcohol use: Yes    Alcohol/week: 0.0 standard drinks    Comment: occasional   Drug use: No   Sexual activity: Never    Birth control/protection: Post-menopausal  Other Topics Concern   Not on file  Social History Narrative   Not on file   Social Determinants of Health   Financial Resource Strain: Not on file  Food Insecurity: Not on file  Transportation Needs: Not on file  Physical Activity: Not on file  Stress: Not on file  Social Connections: Not on file  Intimate Partner Violence: Not on file    Outpatient Medications Prior to Visit  Medication Sig Dispense Refill   aspirin EC 81 MG tablet Take 81 mg by mouth daily.     diazepam (VALIUM) 5 MG tablet Take 1 tablet (5 mg total) by mouth once as needed for up to 1 dose for anxiety. 1 tablet 0    Flaxseed, Linseed, (FLAX SEEDS PO) Take 30 mLs by mouth daily.     folic acid (FOLVITE) 389 MCG tablet Take 400 mcg by mouth daily.     GINSENG PO Take by mouth daily.     levothyroxine (SYNTHROID, LEVOTHROID) 25 MCG tablet Take 1 tablet by mouth daily.  11   Multiple Vitamin (MULTIVITAMIN) tablet Take 1 tablet by mouth daily.     Probiotic Product (PROBIOTIC FORMULA PO) Take 1 capsule by mouth daily. New chapter All-Flora.     sodium fluoride (FLUORISHIELD) 1.1 % GEL dental gel Place onto teeth.     tolterodine (DETROL LA) 4 MG 24 hr capsule Take 1 capsule (4 mg total) by mouth daily. 30 capsule 11   TURMERIC PO Take by mouth daily.     Vitamin D, Ergocalciferol, (DRISDOL) 1.25 MG (50000 UNIT) CAPS capsule Take one tablet wkly 12 capsule 1   amitriptyline (ELAVIL) 10 MG tablet Take 1 tablet (10 mg total) by mouth at bedtime. 30 tablet 2   ibuprofen (ADVIL) 800 MG tablet Take 1 tablet (800 mg total) by mouth every 8 (eight) hours as needed. (Patient not taking: Reported on 08/11/2020) 60 tablet 2   oxybutynin (DITROPAN-XL) 10 MG 24 hr tablet Take 1 tablet (10 mg total) by mouth daily. (Patient not taking: Reported on 08/11/2020) 30 tablet 11   solifenacin (VESICARE) 5 MG tablet Take 1 tablet (5 mg total) by mouth daily. (Patient not taking: Reported on 08/11/2020) 30 tablet 11   Facility-Administered Medications Prior to Visit  Medication Dose Route Frequency Provider Last Rate Last Admin   bupivacaine (MARCAINE) 0.5 % injection 2 mL  2 mL Infiltration Once Opalski, Deborah, DO       lidocaine (XYLOCAINE) 2 % (with pres) injection 20 mg  1 mL Other Once Opalski, Deborah, DO       methylPREDNISolone acetate (DEPO-MEDROL) injection 40 mg  40 mg Intra-articular Once Opalski, Deborah, DO        No Known Allergies  ROS Review of Systems Review of Systems:  A fourteen system review of systems was performed and found to be positive as per HPI.  Objective:    Physical Exam General:  Well  Developed, well nourished, appropriate for stated age.  Neuro:  Alert and oriented,  extra-ocular muscles intact  HEENT:  Normocephalic, atraumatic, neck supple Skin:  no gross rash, warm, pink. Cardiac:  RRR Respiratory:  ECTA B/L w/o  wheezing, crackles or rales, Not using accessory muscles, speaking in full sentences- unlabored. Vascular:  Ext warm, no cyanosis apprec.; cap RF less 2 sec. Psych:  No HI/SI, judgement and insight good, Euthymic mood. Full Affect.  BP 135/74   Pulse 76   Temp (!) 97.5 F (36.4 C)   Ht $R'5\' 3"'TT$  (1.6 m)   Wt 161 lb 1.6 oz (73.1 kg)   SpO2 98%   BMI 28.54 kg/m  Wt Readings from Last 3 Encounters:  08/11/20 161 lb 1.6 oz (73.1 kg)  06/04/20 171 lb 6.4 oz (77.7 kg)  05/26/20 167 lb 8 oz (76 kg)     Health Maintenance Due  Topic Date Due   Zoster Vaccines- Shingrix (1 of 2) Never done   COVID-19 Vaccine (4 - Booster) 03/24/2020    There are no preventive care reminders to display for this patient.  Lab Results  Component Value Date   TSH 1.050 05/26/2020   Lab Results  Component Value Date   WBC 6.0 05/26/2020   HGB 12.6 05/26/2020   HCT 37.3 05/26/2020   MCV 93 05/26/2020   PLT 278 05/26/2020   Lab Results  Component Value Date   NA 142 05/26/2020   K 4.5 05/26/2020   CO2 19 (L) 05/26/2020   GLUCOSE 100 (H) 05/26/2020   BUN 12 05/26/2020   CREATININE 0.73 05/26/2020   BILITOT 0.4 05/26/2020   ALKPHOS 86 05/26/2020   AST 17 05/26/2020   ALT 21 05/26/2020   PROT 6.7 05/26/2020   ALBUMIN 4.3 05/26/2020   CALCIUM 9.2 05/26/2020   EGFR 87 05/26/2020   GFR 76.50 07/29/2011   Lab Results  Component Value Date   CHOL 207 (H) 11/26/2019   Lab Results  Component Value Date   HDL 73 11/26/2019   Lab Results  Component Value Date   LDLCALC 122 (H) 11/26/2019   Lab Results  Component Value Date   TRIG 66 11/26/2019   Lab Results  Component Value Date   CHOLHDL 2.8 11/26/2019   Lab Results  Component Value Date   HGBA1C  CANCELED 11/20/2018      Assessment & Plan:   Problem List Items Addressed This Visit       Musculoskeletal and Integument   Complaints of transient weakness of bilateral lower extremity (Chronic)   Other Visit Diagnoses     Fibromyalgia    -  Primary   Relevant Medications   amitriptyline (ELAVIL) 10 MG tablet      Fibromyalgia: -Discussed with patient reasonable to trial weaning off medication to evaluate for efficacy. Patient will continue medication for now given she has an upcoming surgery and after her recovery will let me know if she would to try discontinuing medication. Advised medication should be gradually tapered.  -Will continue to monitor.   Complaints of transient weakness of bilateral lower extremity: -Patient has been evaluated by cardiology (reviewed 11/07/2017 consult- [facility] cardiology) and discussed referral to rheumatology and/or neurology for further evaluation and/or additional labs such as CK. Patient is scheduled for left hip arthroplasty and will monitor symptoms for now and reassess at follow up visit. Possibly severe left hip OA partly contributing to intermittent symptoms.     Meds ordered this encounter  Medications   amitriptyline (ELAVIL) 10 MG tablet    Sig: Take 1 tablet (10 mg total) by mouth at bedtime.    Dispense:  30 tablet    Refill:  2    Order Specific Question:  Supervising Provider    Answer:   Beatrice Lecher D [[id number]]    Follow-up: Return in about 3 months (around 11/11/2020) for [id number] and [place].   Note:  This note was prepared with assistance of Dragon voice recognition software. Occasional wrong-word or sound-a-like substitutions may have occurred due to the inherent limitations of voice recognition software.   Lorrene Reid, PA-C

## 2020-08-13 ENCOUNTER — Encounter: Payer: Self-pay | Admitting: Physician Assistant

## 2020-08-13 ENCOUNTER — Ambulatory Visit: Payer: Medicare Other | Admitting: Physician Assistant

## 2020-08-13 DIAGNOSIS — M25562 Pain in left knee: Secondary | ICD-10-CM

## 2020-08-13 DIAGNOSIS — G8929 Other chronic pain: Secondary | ICD-10-CM

## 2020-08-13 NOTE — Progress Notes (Signed)
Office Visit Note   Patient: Lindsay [name]           Date of Birth: Jun 15, 1945           MRN: [id number] Visit Date: 08/13/2020              Requested by: Lorrene Reid, PA-C [address],  [place] [address] PCP: Lorrene Reid, PA-C  Chief Complaint  Patient presents with  . Left Knee - Pain      HPI: Patient presents today planning a left total hip replacement with Dr. Ninfa Linden she is asking if she can get an injection into her knee.  She has several questions about her hip replacement  Assessment & Plan: Visit Diagnoses: No diagnosis found.  Plan: Patient may follow-up as needed I have told her I would defer most of her questions to Dr. Ninfa Linden and his PA.  Follow-Up Instructions: No follow-ups on file.   Ortho Exam  Patient is alert, oriented, no adenopathy, well-dressed, normal affect, normal respiratory effort. Left knee no effusion no cellulitis no signs of infection. She has crepitus with range of motion tenderness over the joint line Imaging: No results found. No images are attached to the encounter.  Labs: Lab Results  Component Value Date   HGBA1C CANCELED 11/20/2018   HGBA1C 5.5 09/03/2016   ESRSEDRATE 13 05/26/2020   CRP <1 05/26/2020   REPTSTATUS 08/21/2013 FINAL 08/20/2013   CULT  08/20/2013    Multiple bacterial morphotypes present, none predominant. Suggest appropriate recollection if clinically indicated. Performed at [facility]     Lab Results  Component Value Date   ALBUMIN 4.3 05/26/2020   ALBUMIN 4.2 11/26/2019   ALBUMIN 3.9 11/20/2018    No results found for: MG Lab Results  Component Value Date   VD25OH 50.3 05/26/2020   VD25OH 46.0 11/26/2019   VD25OH 49.5 05/11/2019    No results found for: PREALBUMIN CBC EXTENDED Latest Ref Rng & Units 05/26/2020 11/26/2019 11/20/2018  WBC 3.4 - 10.8 x10E3/uL 6.0 5.8 5.5  RBC 3.77 - 5.28 x10E6/uL 4.03 4.12 3.98  HGB 11.1 - 15.9 g/dL 12.6 13.1 12.6  HCT  34.0 - 46.6 % 37.3 39.0 37.3  PLT 150 - 450 x10E3/uL 278 261 256  NEUTROABS 1.4 - 7.0 x10E3/uL 3.3 3.4 2.7  LYMPHSABS 0.7 - 3.1 x10E3/uL 1.7 1.4 1.8     There is no height or weight on file to calculate BMI.  Orders:  No orders of the defined types were placed in this encounter.  No orders of the defined types were placed in this encounter.    Procedures: Large Joint Inj: L knee on 08/13/2020 3:54 PM Indications: pain and diagnostic evaluation Details: 22 G 1.5 in needle, anteromedial approach  Arthrogram: No  Medications: 40 mg methylPREDNISolone acetate 40 MG/ML; 5 mL lidocaine 1 % Outcome: tolerated well, no immediate complications Procedure, treatment alternatives, risks and benefits explained, specific risks discussed. Consent was given by the patient.     Clinical Data: No additional findings.  ROS:  All other systems negative, except as noted in the HPI. Review of Systems  Objective: Vital Signs: There were no vitals taken for this visit.  Specialty Comments:  No specialty comments available.  PMFS History: Patient Active Problem List   Diagnosis Date Noted  . High serum low-density lipoprotein (LDL) 11/30/2018  . Vitamin D insufficiency 11/30/2018  . High serum high density lipoprotein (HDL) 11/30/2018  . newer onset Exercise intolerance *  4-6 mo 08/15/2017  . Complaints of transient weakness of bilateral lower extremity 08/15/2017  . Transient episodes of numbness of both lower extremities 08/15/2017  . Atypical nevus of back 08/07/2017  . Reflux esophagitis 08/02/2017  . Persistent dry cough 08/02/2017  . Muscle spasm of lower bilateral back 08/02/2017  . Muscle spasms of neck 08/02/2017  . Hoarseness or changing voice 08/02/2017  . Left knee pain 06/30/2017  . Osteoarthritis of knee 06/20/2017  . Cough in adult 03/07/2017  . Acute maxillary sinusitis 03/07/2017  . Chronic pain of left knee 10/25/2016  . Primary osteoarthritis of both knees  10/18/2016  . Urinary incontinence 10/08/2016  . Chronic pain of both knees 10/08/2016  . Generalized osteoarthritis of multiple sites 10/08/2016  . Vegan diet 08/27/2016  . Thyroid goiter 08/27/2016  . h/o Near syncope 08/20/2013  . h/o chronic Dizziness 08/20/2013  . Pain in joint, shoulder region 09/26/2012  . Biceps tendinopathy 09/26/2012  . Right wrist pain 09/26/2012  . S/P AV nodal ablation 08/05/2011  . h/o Paroxysmal SVT (supraventricular tachycardia) ([place]) 10/06/2010  . Palpitations 08/26/2010  . Obesity 08/26/2010   Past Medical History:  Diagnosis Date  . Arthritis   . Chest pain   . Heart murmur   . Herpes   . History of hepatitis    and mononucleosis  . Hypothyroidism   . Mononucleosis 1967  . Multinodular goiter   . Osteopenia   . Palpitations   . S/P AV nodal ablation 08/05/2011  . SVT (supraventricular tachycardia) ([place])   . Thyroid disease    Phreesia 11/23/2019  . Thyroid nodule     Family History  Problem Relation Age of Onset  . Dementia Mother 32       alive  . Transient ischemic attack Mother        hx of; has a pacemaker  . Hypothyroidism Mother   . Other Father 48       old age. Develop CHF the last 4 months  . Other Sister 43       died of thymoma  . Hypothyroidism Sister   . Other Maternal Aunt        nasal tumor  . Heart attack Maternal Grandfather   . Hypothyroidism Maternal Aunt   . Stroke Paternal Grandfather   . Colon cancer Neg Hx   . Esophageal cancer Neg Hx   . Pancreatic cancer Neg Hx   . Prostate cancer Neg Hx   . Rectal cancer Neg Hx   . Stomach cancer Neg Hx     Past Surgical History:  Procedure Laterality Date  . BREAST CYST ASPIRATION Bilateral 12/15/2000  . CATARACT EXTRACTION W/ INTRAOCULAR LENS IMPLANT Bilateral 2014  . paragard iud     8/90  . SUPRAVENTRICULAR TACHYCARDIA ABLATION N/A 08/05/2011   Procedure: SUPRAVENTRICULAR TACHYCARDIA ABLATION;  Surgeon: Evans Lance, MD;  Location: [facility] CATH LAB;  Service:  Cardiovascular;  Laterality: N/A;  . TONSILLECTOMY     Social History   Occupational History  . Not on file  Tobacco Use  . Smoking status: Former    Packs/day: 2.00    Years: 2.00    Pack years: 4.00    Types: Cigarettes    Start date: 03/01/1964    Quit date: 03/01/1970    Years since quitting: 50.4  . Smokeless tobacco: Never  Vaping Use  . Vaping Use: Never used  Substance and Sexual Activity  . Alcohol use: Yes    Alcohol/week: 0.0 standard  drinks    Comment: occasional  . Drug use: No  . Sexual activity: Never    Birth control/protection: Post-menopausal

## 2020-08-14 MED ORDER — LIDOCAINE HCL 1 % IJ SOLN
5.0000 mL | INTRAMUSCULAR | Status: AC | PRN
  Administered 2020-08-13: 5 mL

## 2020-08-14 MED ORDER — METHYLPREDNISOLONE ACETATE 40 MG/ML IJ SUSP
40.0000 mg | INTRAMUSCULAR | Status: AC | PRN
  Administered 2020-08-13: 40 mg via INTRA_ARTICULAR

## 2020-08-18 ENCOUNTER — Other Ambulatory Visit: Payer: Self-pay | Admitting: Physician Assistant

## 2020-08-22 NOTE — Patient Instructions (Addendum)
DUE TO COVID-19 ONLY ONE VISITOR IS ALLOWED TO COME WITH YOU AND STAY IN THE WAITING ROOM ONLY DURING PRE OP AND PROCEDURE DAY OF SURGERY. THE 2 VISITORS  MAY VISIT WITH YOU AFTER SURGERY IN YOUR PRIVATE ROOM DURING VISITING HOURS ONLY!  YOU NEED TO HAVE A COVID 19 TEST ON    6/29_______ @__2 :55pm_____, THIS TEST MUST BE DONE BEFORE SURGERY,  COVID TESTING SITE [place] [address], IT IS ON THE RIGHT GOING OUT [address] APPROXIMATELY  2 MINUTES PAST [employer] ON THE RIGHT. ONCE YOUR COVID TEST IS COMPLETED,  PLEASE BEGIN THE QUARANTINE INSTRUCTIONS AS OUTLINED IN YOUR HANDOUT.                Lindsay [name]     Your procedure is scheduled on: 08/29/20   Report to [facility] Main  Entrance   Report to admitting at  8:30 AM     Call this number if you have problems the morning of surgery [phone]  . BRUSH YOUR TEETH MORNING OF SURGERY AND RINSE YOUR MOUTH OUT, NO CHEWING GUM CANDY OR MINTS.   No food after midnight.    You may have clear liquid until 8:00 AM.    At 7:30 AM drink pre surgery drink.    Nothing by mouth after 8:00 AM.   Take these medicines the morning of surgery with A SIP OF WATER: Levothyroxine, Tolterodine                                You may not have any metal on your body including hair pins and              piercings  Do not wear jewelry, make-up, lotions, powders or perfumes, deodorant             Do not wear nail polish on your fingernails.  Do not shave  48 hours prior to surgery.               Do not bring valuables to the hospital. [place].  Contacts, dentures or bridgework may not be worn into surgery.                   Please read over the following fact sheets you were given: _____________________________________________________________________             [facility] - Preparing for Surgery Before surgery, you can play an important role.   Because skin is not sterile, your skin needs to be as free of germs as possible.  You can reduce the number of germs on your skin by washing with CHG (chlorahexidine gluconate) soap before surgery.  CHG is an antiseptic cleaner which kills germs and bonds with the skin to continue killing germs even after washing. Please DO NOT use if you have an allergy to CHG or antibacterial soaps.  If your skin becomes reddened/irritated stop using the CHG and inform your nurse when you arrive at Short Stay. Do not shave (including legs and underarms) for at least 48 hours prior to the first CHG shower.   Please follow these instructions carefully:  1.  Shower with CHG Soap the night before surgery and the  morning of Surgery.  2.  If you choose to wash your  hair, wash your hair first as usual with your  normal  shampoo.  3.  After you shampoo, rinse your hair and body thoroughly to remove the  shampoo.                                        4.  Use CHG as you would any other liquid soap.  You can apply chg directly  to the skin and wash                       Gently with a scrungie or clean washcloth.  5.  Apply the CHG Soap to your body ONLY FROM THE NECK DOWN.   Do not use on face/ open                           Wound or open sores. Avoid contact with eyes, ears mouth and genitals (private parts).                       Wash face,  Genitals (private parts) with your normal soap.             6.  Wash thoroughly, paying special attention to the area where your surgery  will be performed.  7.  Thoroughly rinse your body with warm water from the neck down.  8.  DO NOT shower/wash with your normal soap after using and rinsing off  the CHG Soap.             9.  Pat yourself dry with a clean towel.            10.  Wear clean pajamas.            11.  Place clean sheets on your bed the night of your first shower and do not  sleep with pets. Day of Surgery : Do not apply any lotions/deodorants the morning of surgery.   Please wear clean clothes to the hospital/surgery center.  FAILURE TO FOLLOW THESE INSTRUCTIONS MAY RESULT IN THE CANCELLATION OF YOUR SURGERY PATIENT SIGNATURE_________________________________  NURSE SIGNATURE__________________________________  ________________________________________________________________________   Adam Phenix  An incentive spirometer is a tool that can help keep your lungs clear and active. This tool measures how well you are filling your lungs with each breath. Taking long deep breaths may help reverse or decrease the chance of developing breathing (pulmonary) problems (especially infection) following: A long period of time when you are unable to move or be active. BEFORE THE PROCEDURE  If the spirometer includes an indicator to show your best effort, your nurse or respiratory therapist will set it to a desired goal. If possible, sit up straight or lean slightly forward. Try not to slouch. Hold the incentive spirometer in an upright position. INSTRUCTIONS FOR USE  Sit on the edge of your bed if possible, or sit up as far as you can in bed or on a chair. Hold the incentive spirometer in an upright position. Breathe out normally. Place the mouthpiece in your mouth and seal your lips tightly around it. Breathe in slowly and as deeply as possible, raising the piston or the ball toward the top of the column. Hold your breath for 3-5 seconds or for as long as possible. Allow the piston or ball to fall to the bottom of the  column. Remove the mouthpiece from your mouth and breathe out normally. Rest for a few seconds and repeat Steps 1 through 7 at least 10 times every 1-2 hours when you are awake. Take your time and take a few normal breaths between deep breaths. The spirometer may include an indicator to show your best effort. Use the indicator as a goal to work toward during each repetition. After each set of 10 deep breaths, practice coughing to be sure your  lungs are clear. If you have an incision (the cut made at the time of surgery), support your incision when coughing by placing a pillow or rolled up towels firmly against it. Once you are able to get out of bed, walk around indoors and cough well. You may stop using the incentive spirometer when instructed by your caregiver.  RISKS AND COMPLICATIONS Take your time so you do not get dizzy or light-headed. If you are in pain, you may need to take or ask for pain medication before doing incentive spirometry. It is harder to take a deep breath if you are having pain. AFTER USE Rest and breathe slowly and easily. It can be helpful to keep track of a log of your progress. Your caregiver can provide you with a simple table to help with this. If you are using the spirometer at home, follow these instructions: [place] IF:  You are having difficultly using the spirometer. You have trouble using the spirometer as often as instructed. Your pain medication is not giving enough relief while using the spirometer. You develop fever of 100.5 F (38.1 C) or higher. SEEK IMMEDIATE MEDICAL CARE IF:  You cough up bloody sputum that had not been present before. You develop fever of 102 F (38.9 C) or greater. You develop worsening pain at or near the incision site. MAKE SURE YOU:  Understand these instructions. Will watch your condition. Will get help right away if you are not doing well or get worse. Document Released: 06/28/2006 Document Revised: 05/10/2011 Document Reviewed: 08/29/2006 [facility] Patient Information 2014 [place], [place].   ________________________________________________________________________

## 2020-08-25 ENCOUNTER — Encounter (HOSPITAL_COMMUNITY): Payer: Self-pay

## 2020-08-25 ENCOUNTER — Other Ambulatory Visit: Payer: Self-pay

## 2020-08-25 ENCOUNTER — Encounter (HOSPITAL_COMMUNITY)
Admission: RE | Admit: 2020-08-25 | Discharge: 2020-08-25 | Disposition: A | Discharge status: Home / Self Care | Payer: Medicare Other | Source: Ambulatory Visit | Attending: Orthopaedic Surgery | Admitting: Orthopaedic Surgery

## 2020-08-25 ENCOUNTER — Telehealth: Payer: Self-pay | Admitting: Orthopaedic Surgery

## 2020-08-25 DIAGNOSIS — Z01818 Encounter for other preprocedural examination: Secondary | ICD-10-CM | POA: Diagnosis not present

## 2020-08-25 HISTORY — DX: Fibromyalgia: M79.7

## 2020-08-25 LAB — SURGICAL PCR SCREEN
MRSA, PCR: NEGATIVE
Staphylococcus aureus: NEGATIVE

## 2020-08-25 LAB — CBC
HCT: 37.7 % (ref 36.0–46.0)
Hemoglobin: 12.1 g/dL (ref 12.0–15.0)
MCH: 31.3 pg (ref 26.0–34.0)
MCHC: 32.1 g/dL (ref 30.0–36.0)
MCV: 97.7 fL (ref 80.0–100.0)
Platelets: 274 10*3/uL (ref 150–400)
RBC: 3.86 MIL/uL — ABNORMAL LOW (ref 3.87–5.11)
RDW: 12.9 % (ref 11.5–15.5)
WBC: 7.2 10*3/uL (ref 4.0–10.5)
nRBC: 0 % (ref 0.0–0.2)

## 2020-08-25 LAB — BASIC METABOLIC PANEL
Anion gap: 6 (ref 5–15)
BUN: 14 mg/dL (ref 8–23)
CO2: 28 mmol/L (ref 22–32)
Calcium: 9.1 mg/dL (ref 8.9–10.3)
Chloride: 105 mmol/L (ref 98–111)
Creatinine, Ser: 0.68 mg/dL (ref 0.44–1.00)
GFR, Estimated: >60 mL/min (ref >60)
Glucose, Bld: 100 mg/dL — ABNORMAL HIGH (ref 70–99)
Potassium: 4 mmol/L (ref 3.5–5.1)
Sodium: 139 mmol/L (ref 135–145)

## 2020-08-25 NOTE — Telephone Encounter (Signed)
Pt called stating she's having surgery on 08/29/20 and she will be doing her recovery in [place] so she would like to have any rxs that were going to be called in, sent in early so she can pick it up and then go to [facility] after surgery. Pt ask that the latest this be sent in is on Thursday and she would like a CB when it has been sent.   [phone]

## 2020-08-25 NOTE — Progress Notes (Signed)
COVID Vaccine Completed:Yes Date COVID Vaccine completed:05/19/19-boosters 12/17/19, 08/16/20 COVID vaccine manufacturer: Pfizer -boosters   Moderna  ist 2 shots   PCP - Lorrene Reid LOV 05/26/20-epic Cardiologist - Dr. Christen Butter. LOV and ECHO requested  Chest x-ray - no EKG - 08/25/20-chart Stress Test - no ECHO - 2019 or 2018 Cardiac Cath - no Pacemaker/ICD device last checked:NA  Sleep Study - no CPAP -   Fasting Blood Sugar - NA Checks Blood Sugar _____ times a day  Blood Thinner Instructions:ASA 81/ M. Abonza Aspirin Instructions:none Last Dose:  Anesthesia review: yes  Patient denies shortness of breath, fever, cough and chest pain at PAT appointment Yes. Pt reports no SOB with any activities.  Patient verbalized understanding of instructions that were given to them at the PAT appointment. Patient was also instructed that they will need to review over the PAT instructions again at home before surgery. yes

## 2020-08-26 ENCOUNTER — Other Ambulatory Visit: Payer: Self-pay | Admitting: Physician Assistant

## 2020-08-26 MED ORDER — OXYCODONE-ACETAMINOPHEN 5-325 MG PO TABS: 1.0000 | ORAL_TABLET | ORAL | 0 refills | Status: DC | PRN

## 2020-08-26 MED ORDER — METHOCARBAMOL 500 MG PO TABS: 500.0000 mg | ORAL_TABLET | Freq: Three times a day (TID) | ORAL | 1 refills | Status: DC

## 2020-08-26 MED ORDER — ASPIRIN EC 325 MG PO TBEC: 325.0000 mg | DELAYED_RELEASE_TABLET | Freq: Two times a day (BID) | ORAL | 0 refills | Status: DC

## 2020-08-26 NOTE — Telephone Encounter (Signed)
I called pt. She wants them sent to her [facility] in chart

## 2020-08-27 ENCOUNTER — Other Ambulatory Visit (HOSPITAL_COMMUNITY)
Admission: RE | Admit: 2020-08-27 | Discharge: 2020-08-27 | Disposition: A | Discharge status: Home / Self Care | Payer: Medicare Other | Source: Ambulatory Visit | Attending: Orthopaedic Surgery | Admitting: Orthopaedic Surgery

## 2020-08-27 DIAGNOSIS — Z20822 Contact with and (suspected) exposure to covid-19: Secondary | ICD-10-CM | POA: Diagnosis not present

## 2020-08-27 DIAGNOSIS — Z01812 Encounter for preprocedural laboratory examination: Secondary | ICD-10-CM | POA: Diagnosis not present

## 2020-08-27 LAB — SARS CORONAVIRUS 2 (TAT 6-24 HRS): SARS Coronavirus 2: NEGATIVE

## 2020-08-28 ENCOUNTER — Telehealth: Payer: Self-pay

## 2020-08-28 ENCOUNTER — Other Ambulatory Visit: Payer: Self-pay | Admitting: Orthopaedic Surgery

## 2020-08-28 DIAGNOSIS — M1612 Unilateral primary osteoarthritis, left hip: Secondary | ICD-10-CM

## 2020-08-28 MED ORDER — ONDANSETRON HCL 4 MG PO TABS: 4.0000 mg | ORAL_TABLET | Freq: Three times a day (TID) | ORAL | 0 refills | Status: DC | PRN

## 2020-08-28 NOTE — H&P (Signed)
TOTAL HIP ADMISSION H&P  Patient is admitted for left total hip arthroplasty.  Subjective:  Chief Complaint: left hip pain  HPI: Lindsay [name], 75 y.o. female, has a history of pain and functional disability in the left hip(s) due to arthritis and patient has failed non-surgical conservative treatments for greater than 12 weeks to include NSAID's and/or analgesics, corticosteriod injections, flexibility and strengthening excercises, use of assistive devices, and activity modification.  Onset of symptoms was gradual starting 1 years ago with gradually worsening course since that time.The patient noted no past surgery on the left hip(s).  Patient currently rates pain in the left hip at 10 out of 10 with activity. Patient has night pain, worsening of pain with activity and weight bearing, pain that interfers with activities of daily living, and pain with passive range of motion. Patient has evidence of subchondral sclerosis, periarticular osteophytes, and joint space narrowing by imaging studies. This condition presents safety issues increasing the risk of falls.  There is no current active infection.  Patient Active Problem List   Diagnosis Date Noted   Unilateral primary osteoarthritis, left hip 08/28/2020   High serum low-density lipoprotein (LDL) 11/30/2018   Vitamin D insufficiency 11/30/2018   High serum high density lipoprotein (HDL) 11/30/2018   newer onset Exercise intolerance * 4-6 mo 08/15/2017   Complaints of transient weakness of bilateral lower extremity 08/15/2017   Transient episodes of numbness of both lower extremities 08/15/2017   Atypical nevus of back 08/07/2017   Reflux esophagitis 08/02/2017   Persistent dry cough 08/02/2017   Muscle spasm of lower bilateral back 08/02/2017   Muscle spasms of neck 08/02/2017   Hoarseness or changing voice 08/02/2017   Left knee pain 06/30/2017   Osteoarthritis of knee 06/20/2017   Cough in adult 03/07/2017   Acute maxillary sinusitis  03/07/2017   Chronic pain of left knee 10/25/2016   Primary osteoarthritis of both knees 10/18/2016   Urinary incontinence 10/08/2016   Chronic pain of both knees 10/08/2016   Generalized osteoarthritis of multiple sites 10/08/2016   Vegan diet 08/27/2016   Thyroid goiter 08/27/2016   h/o Near syncope 08/20/2013   h/o chronic Dizziness 08/20/2013   Pain in joint, shoulder region 09/26/2012   Biceps tendinopathy 09/26/2012   Right wrist pain 09/26/2012   S/P AV nodal ablation 08/05/2011   h/o Paroxysmal SVT (supraventricular tachycardia) ([place]) 10/06/2010   Palpitations 08/26/2010   Obesity 08/26/2010   Past Medical History:  Diagnosis Date   Arthritis    Dysrhythmia 2014   SVT. ablation done. no problems since   Fibromyalgia    Heart murmur 2019   Hepatitis 1967   in college with mononucleosis   Herpes 1984   Hypothyroidism    Multinodular goiter    Osteopenia    S/P AV nodal ablation 08/05/2011   SVT (supraventricular tachycardia) ([facility])    Thyroid disease    thyroid nodule being monitored by Dr. Chalmers Cater    Past Surgical History:  Procedure Laterality Date   BREAST CYST ASPIRATION Bilateral 12/15/2000   CATARACT EXTRACTION W/ INTRAOCULAR LENS IMPLANT Bilateral 2014   paragard iud     8/90   SUPRAVENTRICULAR TACHYCARDIA ABLATION N/A 08/05/2011   Procedure: SUPRAVENTRICULAR TACHYCARDIA ABLATION;  Surgeon: Evans Lance, MD;  Location: [facility] CATH LAB;  Service: Cardiovascular;  Laterality: N/A;   TONSILLECTOMY  1950    Current Facility-Administered Medications  Medication Dose Route Frequency Provider Last Rate Last Admin   bupivacaine (MARCAINE) 0.5 % injection 2 mL  2 mL Infiltration Once Opalski, Deborah, DO       lidocaine (XYLOCAINE) 2 % (with pres) injection 20 mg  1 mL Other Once Opalski, Deborah, DO       methylPREDNISolone acetate (DEPO-MEDROL) injection 40 mg  40 mg Intra-articular Once Mellody Dance, DO       Current Outpatient Medications  Medication Sig  Dispense Refill Last Dose   ondansetron (ZOFRAN) 4 MG tablet Take 1 tablet (4 mg total) by mouth every 8 (eight) hours as needed for nausea or vomiting. 20 tablet 0    amitriptyline (ELAVIL) 10 MG tablet Take 1 tablet (10 mg total) by mouth at bedtime. 30 tablet 2    Ascorbic Acid (VITAMIN C) 1000 MG tablet Take 1,000 mg by mouth at bedtime.      aspirin EC 325 MG tablet Take 1 tablet (325 mg total) by mouth in the morning and at bedtime. 35 tablet 0    diazepam (VALIUM) 5 MG tablet Take 1 tablet (5 mg total) by mouth once as needed for up to 1 dose for anxiety. (Patient not taking: No sig reported) 1 tablet 0    ELDERBERRY PO Take 5 mLs by mouth at bedtime.      Flaxseed, Linseed, (FLAX SEEDS PO) Take 30 mLs by mouth daily.      folic acid (FOLVITE) 101 MCG tablet Take 800 mcg by mouth daily.      GINSENG PO Take 25 drops by mouth daily.      ibuprofen (ADVIL) 800 MG tablet Take 1 tablet (800 mg total) by mouth every 8 (eight) hours as needed. 60 tablet 2    levothyroxine (SYNTHROID, LEVOTHROID) 25 MCG tablet Take 25 mcg by mouth daily.  11    methocarbamol (ROBAXIN) 500 MG tablet Take 1 tablet (500 mg total) by mouth 3 (three) times daily. 40 tablet 1    Multiple Vitamin (MULTIVITAMIN) tablet Take 1 tablet by mouth daily.      Multiple Vitamins-Minerals (ZINC PO) Take 1 tablet by mouth daily.      Nutritional Supplements (WELLNESS ESSENTIALS PO) Take 5 mLs by mouth daily.      oxyCODONE-acetaminophen (PERCOCET/ROXICET) 5-325 MG tablet Take 1-2 tablets by mouth every 4 (four) hours as needed for severe pain. 30 tablet 0    Probiotic Product (PROBIOTIC FORMULA PO) Take 1 capsule by mouth daily. New chapter All-Flora.      sodium fluoride (FLUORISHIELD) 1.1 % GEL dental gel Place 1 application onto teeth at bedtime.      tolterodine (DETROL LA) 4 MG 24 hr capsule Take 1 capsule (4 mg total) by mouth daily. 30 capsule 11    TURMERIC PO Take 1 tablet by mouth daily.      Vitamin D, Ergocalciferol,  (DRISDOL) 1.25 MG (50000 UNIT) CAPS capsule Take one tablet wkly (Patient taking differently: Take 50,000 Units by mouth every 7 (seven) days.) 12 capsule 1    Allergies  Allergen Reactions   Nickel Rash    Social History   Tobacco Use   Smoking status: Former    Packs/day: 2.00    Years: 2.00    Pack years: 4.00    Types: Cigarettes    Start date: 03/01/1964    Quit date: 03/01/1970    Years since quitting: 50.5   Smokeless tobacco: Never  Substance Use Topics   Alcohol use: Yes    Alcohol/week: 0.0 standard drinks    Comment: occasional    Family History  Problem Relation Age of  Onset   Dementia Mother 19       alive   Transient ischemic attack Mother        hx of; has a pacemaker   Hypothyroidism Mother    Other Father 65       old age. Develop CHF the last 4 months   Other Sister 62       died of thymoma   Hypothyroidism Sister    Other Maternal Aunt        nasal tumor   Heart attack Maternal Grandfather    Hypothyroidism Maternal Aunt    Stroke Paternal Grandfather    Colon cancer Neg Hx    Esophageal cancer Neg Hx    Pancreatic cancer Neg Hx    Prostate cancer Neg Hx    Rectal cancer Neg Hx    Stomach cancer Neg Hx      Review of Systems  Musculoskeletal:  Positive for gait problem.  All other systems reviewed and are negative.  Objective:  Physical Exam Vitals reviewed.  Constitutional:      Appearance: Normal appearance.  HENT:     Head: Normocephalic and atraumatic.  Eyes:     Extraocular Movements: Extraocular movements intact.     Pupils: Pupils are equal, round, and reactive to light.  Cardiovascular:     Rate and Rhythm: Normal rate and regular rhythm.  Pulmonary:     Effort: Pulmonary effort is normal.     Breath sounds: Normal breath sounds.  Abdominal:     Palpations: Abdomen is soft.  Musculoskeletal:     Cervical back: Normal range of motion and neck supple.     Left hip: Tenderness and bony tenderness present. Decreased range of  motion. Decreased strength.  Neurological:     Mental Status: She is alert and oriented to person, place, and time.  Psychiatric:        Behavior: Behavior normal.    Vital signs in last 24 hours:    Labs:   Estimated body mass index is 27.99 kg/m as calculated from the following:   Height as of 08/25/20: 5\' 3"  (1.6 m).   Weight as of 08/25/20: 71.7 kg.   Imaging Review Plain radiographs demonstrate severe degenerative joint disease of the left hip(s). The bone quality appears to be excellent for age and reported activity level.      Assessment/Plan:  End stage arthritis, left hip(s)  The patient history, physical examination, clinical judgement of the provider and imaging studies are consistent with end stage degenerative joint disease of the left hip(s) and total hip arthroplasty is deemed medically necessary. The treatment options including medical management, injection therapy, arthroscopy and arthroplasty were discussed at length. The risks and benefits of total hip arthroplasty were presented and reviewed. The risks due to aseptic loosening, infection, stiffness, dislocation/subluxation,  thromboembolic complications and other imponderables were discussed.  The patient acknowledged the explanation, agreed to proceed with the plan and consent was signed. Patient is being admitted for inpatient treatment for surgery, pain control, PT, OT, prophylactic antibiotics, VTE prophylaxis, progressive ambulation and ADL's and discharge planning.The patient is planning to be discharged home with home health services

## 2020-08-28 NOTE — Telephone Encounter (Signed)
Patient called she is requesting a rx for nausea to be sent to pharmacy on file call [phone]

## 2020-08-29 ENCOUNTER — Observation Stay (HOSPITAL_COMMUNITY)
Admission: RE | Admit: 2020-08-29 | Discharge: 2020-08-31 | Disposition: A | Discharge status: Home / Self Care | Payer: Medicare Other | Attending: Orthopaedic Surgery | Admitting: Orthopaedic Surgery

## 2020-08-29 ENCOUNTER — Other Ambulatory Visit: Payer: Self-pay

## 2020-08-29 ENCOUNTER — Encounter (HOSPITAL_COMMUNITY)
Admission: RE | Disposition: A | Discharge status: Home / Self Care | Payer: Self-pay | Source: Home / Self Care | Attending: Orthopaedic Surgery

## 2020-08-29 ENCOUNTER — Observation Stay (HOSPITAL_COMMUNITY): Payer: Medicare Other

## 2020-08-29 ENCOUNTER — Ambulatory Visit (HOSPITAL_COMMUNITY): Payer: Medicare Other

## 2020-08-29 ENCOUNTER — Encounter (HOSPITAL_COMMUNITY): Payer: Self-pay | Admitting: Orthopaedic Surgery

## 2020-08-29 ENCOUNTER — Ambulatory Visit (HOSPITAL_COMMUNITY): Payer: Medicare Other | Admitting: Anesthesiology

## 2020-08-29 DIAGNOSIS — Z79899 Other long term (current) drug therapy: Secondary | ICD-10-CM | POA: Diagnosis not present

## 2020-08-29 DIAGNOSIS — K2100 Gastro-esophageal reflux disease with esophagitis, without bleeding: Secondary | ICD-10-CM | POA: Diagnosis not present

## 2020-08-29 DIAGNOSIS — Z7982 Long term (current) use of aspirin: Secondary | ICD-10-CM | POA: Diagnosis not present

## 2020-08-29 DIAGNOSIS — Z96642 Presence of left artificial hip joint: Secondary | ICD-10-CM

## 2020-08-29 DIAGNOSIS — Z9889 Other specified postprocedural states: Secondary | ICD-10-CM | POA: Diagnosis not present

## 2020-08-29 DIAGNOSIS — M1612 Unilateral primary osteoarthritis, left hip: Secondary | ICD-10-CM | POA: Diagnosis not present

## 2020-08-29 DIAGNOSIS — R600 Localized edema: Secondary | ICD-10-CM | POA: Diagnosis not present

## 2020-08-29 DIAGNOSIS — Z419 Encounter for procedure for purposes other than remedying health state, unspecified: Secondary | ICD-10-CM

## 2020-08-29 DIAGNOSIS — E039 Hypothyroidism, unspecified: Secondary | ICD-10-CM | POA: Diagnosis not present

## 2020-08-29 DIAGNOSIS — Z87891 Personal history of nicotine dependence: Secondary | ICD-10-CM | POA: Diagnosis not present

## 2020-08-29 DIAGNOSIS — E559 Vitamin D deficiency, unspecified: Secondary | ICD-10-CM | POA: Diagnosis not present

## 2020-08-29 DIAGNOSIS — Z471 Aftercare following joint replacement surgery: Secondary | ICD-10-CM | POA: Diagnosis not present

## 2020-08-29 HISTORY — PX: TOTAL HIP ARTHROPLASTY: SHX124

## 2020-08-29 LAB — TYPE AND SCREEN
ABO/RH(D): O POS
Antibody Screen: NEGATIVE

## 2020-08-29 LAB — ABO/RH: ABO/RH(D): O POS

## 2020-08-29 SURGERY — ARTHROPLASTY, HIP, TOTAL, ANTERIOR APPROACH
Anesthesia: Spinal | Site: Hip | Laterality: Left

## 2020-08-29 MED ORDER — ADULT MULTIVITAMIN W/MINERALS CH
1.0000 | ORAL_TABLET | Freq: Every day | ORAL | Status: DC
  Administered 2020-08-30 – 2020-08-31 (×2): 1 via ORAL
  Filled 2020-08-29 (×2): qty 1

## 2020-08-29 MED ORDER — ONDANSETRON HCL 4 MG/2ML IJ SOLN
INTRAMUSCULAR | Status: AC
  Filled 2020-08-29: qty 2

## 2020-08-29 MED ORDER — HYDROMORPHONE HCL 1 MG/ML IJ SOLN: 0.5000 mg | INTRAMUSCULAR | Status: DC | PRN

## 2020-08-29 MED ORDER — DIPHENHYDRAMINE HCL 12.5 MG/5ML PO ELIX
12.5000 mg | ORAL_SOLUTION | ORAL | Status: DC | PRN
Start: 2020-08-29 — End: 2020-08-31

## 2020-08-29 MED ORDER — 0.9 % SODIUM CHLORIDE (POUR BTL) OPTIME
TOPICAL | Status: DC | PRN
Start: 2020-08-29 — End: 2020-08-29
  Administered 2020-08-29: 1000 mL

## 2020-08-29 MED ORDER — MIDAZOLAM HCL 2 MG/2ML IJ SOLN
INTRAMUSCULAR | Status: AC
  Filled 2020-08-29: qty 2

## 2020-08-29 MED ORDER — ALUM & MAG HYDROXIDE-SIMETH 200-200-20 MG/5ML PO SUSP: 30.0000 mL | ORAL | Status: DC | PRN

## 2020-08-29 MED ORDER — CEFAZOLIN SODIUM-DEXTROSE 2-4 GM/100ML-% IV SOLN
2.0000 g | INTRAVENOUS | Status: AC
  Administered 2020-08-29: 2 g via INTRAVENOUS
  Filled 2020-08-29: qty 100

## 2020-08-29 MED ORDER — TRANEXAMIC ACID-NACL 1000-0.7 MG/100ML-% IV SOLN
1000.0000 mg | INTRAVENOUS | Status: AC
  Administered 2020-08-29: 1000 mg via INTRAVENOUS
  Filled 2020-08-29: qty 100

## 2020-08-29 MED ORDER — ACETAMINOPHEN 325 MG PO TABS
325.0000 mg | ORAL_TABLET | Freq: Four times a day (QID) | ORAL | Status: DC | PRN
Start: 2020-08-30 — End: 2020-08-31

## 2020-08-29 MED ORDER — PHENOL 1.4 % MT LIQD: 1.0000 | OROMUCOSAL | Status: DC | PRN

## 2020-08-29 MED ORDER — FESOTERODINE FUMARATE ER 8 MG PO TB24
8.0000 mg | ORAL_TABLET | Freq: Every day | ORAL | Status: DC
  Administered 2020-08-30 – 2020-08-31 (×2): 8 mg via ORAL
  Filled 2020-08-29 (×2): qty 1

## 2020-08-29 MED ORDER — FENTANYL CITRATE (PF) 100 MCG/2ML IJ SOLN
INTRAMUSCULAR | Status: AC
  Filled 2020-08-29: qty 2

## 2020-08-29 MED ORDER — LEVOTHYROXINE SODIUM 25 MCG PO TABS
25.0000 ug | ORAL_TABLET | Freq: Every day | ORAL | Status: DC
  Administered 2020-08-30 – 2020-08-31 (×2): 25 ug via ORAL
  Filled 2020-08-29 (×2): qty 1

## 2020-08-29 MED ORDER — METOCLOPRAMIDE HCL 5 MG PO TABS
5.0000 mg | ORAL_TABLET | Freq: Three times a day (TID) | ORAL | Status: DC | PRN
Start: 2020-08-29 — End: 2020-08-31

## 2020-08-29 MED ORDER — PROPOFOL 500 MG/50ML IV EMUL
INTRAVENOUS | Status: AC
  Filled 2020-08-29: qty 50

## 2020-08-29 MED ORDER — OXYCODONE HCL 5 MG PO TABS
5.0000 mg | ORAL_TABLET | ORAL | Status: DC | PRN
  Administered 2020-08-29 – 2020-08-31 (×4): 10 mg via ORAL
  Filled 2020-08-29 (×4): qty 2

## 2020-08-29 MED ORDER — SODIUM CHLORIDE 0.9 % IR SOLN
Status: DC | PRN
  Administered 2020-08-29: 1000 mL

## 2020-08-29 MED ORDER — ONDANSETRON HCL 4 MG PO TABS: 4.0000 mg | ORAL_TABLET | Freq: Four times a day (QID) | ORAL | Status: DC | PRN

## 2020-08-29 MED ORDER — SODIUM CHLORIDE 0.9 % IV SOLN: INTRAVENOUS | Status: DC

## 2020-08-29 MED ORDER — ASCORBIC ACID 500 MG PO TABS
1000.0000 mg | ORAL_TABLET | Freq: Every day | ORAL | Status: DC
  Administered 2020-08-29 – 2020-08-30 (×2): 1000 mg via ORAL
  Filled 2020-08-29 (×2): qty 2

## 2020-08-29 MED ORDER — FOLIC ACID 1 MG PO TABS
1.0000 mg | ORAL_TABLET | Freq: Every day | ORAL | Status: DC
  Administered 2020-08-30 – 2020-08-31 (×2): 1 mg via ORAL
  Filled 2020-08-29 (×2): qty 1

## 2020-08-29 MED ORDER — AMITRIPTYLINE HCL 10 MG PO TABS
10.0000 mg | ORAL_TABLET | Freq: Every day | ORAL | Status: DC
  Administered 2020-08-29 – 2020-08-30 (×2): 10 mg via ORAL
  Filled 2020-08-29 (×2): qty 1

## 2020-08-29 MED ORDER — ONDANSETRON HCL 4 MG/2ML IJ SOLN: 4.0000 mg | Freq: Once | INTRAMUSCULAR | Status: DC | PRN

## 2020-08-29 MED ORDER — METHOCARBAMOL 500 MG PO TABS
500.0000 mg | ORAL_TABLET | Freq: Four times a day (QID) | ORAL | Status: DC | PRN
  Administered 2020-08-29 – 2020-08-31 (×4): 500 mg via ORAL
  Filled 2020-08-29 (×4): qty 1

## 2020-08-29 MED ORDER — PHENYLEPHRINE HCL-NACL 10-0.9 MG/250ML-% IV SOLN
INTRAVENOUS | Status: DC | PRN
  Administered 2020-08-29: 25 ug/min via INTRAVENOUS

## 2020-08-29 MED ORDER — PROPOFOL 10 MG/ML IV BOLUS
INTRAVENOUS | Status: DC | PRN
  Administered 2020-08-29: 10 mg via INTRAVENOUS

## 2020-08-29 MED ORDER — CHLORHEXIDINE GLUCONATE 0.12 % MT SOLN
15.0000 mL | Freq: Once | OROMUCOSAL | Status: AC
  Administered 2020-08-29: 15 mL via OROMUCOSAL

## 2020-08-29 MED ORDER — METOCLOPRAMIDE HCL 5 MG/ML IJ SOLN: 5.0000 mg | Freq: Three times a day (TID) | INTRAMUSCULAR | Status: DC | PRN

## 2020-08-29 MED ORDER — DEXAMETHASONE SODIUM PHOSPHATE 10 MG/ML IJ SOLN
INTRAMUSCULAR | Status: AC
  Filled 2020-08-29: qty 1

## 2020-08-29 MED ORDER — ORAL CARE MOUTH RINSE: 15.0000 mL | Freq: Once | OROMUCOSAL | Status: AC

## 2020-08-29 MED ORDER — MENTHOL 3 MG MT LOZG: 1.0000 | LOZENGE | OROMUCOSAL | Status: DC | PRN

## 2020-08-29 MED ORDER — METHOCARBAMOL 500 MG IVPB - SIMPLE MED
500.0000 mg | Freq: Four times a day (QID) | INTRAVENOUS | Status: DC | PRN
  Filled 2020-08-29: qty 50

## 2020-08-29 MED ORDER — PANTOPRAZOLE SODIUM 40 MG PO TBEC
40.0000 mg | DELAYED_RELEASE_TABLET | Freq: Every day | ORAL | Status: DC
  Administered 2020-08-30 – 2020-08-31 (×2): 40 mg via ORAL
  Filled 2020-08-29 (×2): qty 1

## 2020-08-29 MED ORDER — ONE-DAILY MULTI VITAMINS PO TABS: 1.0000 | ORAL_TABLET | Freq: Every day | ORAL | Status: DC

## 2020-08-29 MED ORDER — OXYCODONE HCL 5 MG PO TABS
10.0000 mg | ORAL_TABLET | ORAL | Status: DC | PRN
  Administered 2020-08-30: 15 mg via ORAL
  Administered 2020-08-30: 10 mg via ORAL
  Filled 2020-08-29: qty 2
  Filled 2020-08-29: qty 3

## 2020-08-29 MED ORDER — DEXAMETHASONE SODIUM PHOSPHATE 10 MG/ML IJ SOLN
INTRAMUSCULAR | Status: DC | PRN
  Administered 2020-08-29: 10 mg via INTRAVENOUS

## 2020-08-29 MED ORDER — OXYCODONE HCL 5 MG PO TABS: 5.0000 mg | ORAL_TABLET | Freq: Once | ORAL | Status: DC | PRN

## 2020-08-29 MED ORDER — ASPIRIN EC 325 MG PO TBEC
325.0000 mg | DELAYED_RELEASE_TABLET | Freq: Two times a day (BID) | ORAL | Status: DC
  Administered 2020-08-30 – 2020-08-31 (×3): 325 mg via ORAL
  Filled 2020-08-29 (×3): qty 1

## 2020-08-29 MED ORDER — FOLIC ACID 800 MCG PO TABS: 800.0000 ug | ORAL_TABLET | Freq: Every day | ORAL | Status: DC

## 2020-08-29 MED ORDER — ONDANSETRON HCL 4 MG/2ML IJ SOLN
INTRAMUSCULAR | Status: DC | PRN
  Administered 2020-08-29: 4 mg via INTRAVENOUS

## 2020-08-29 MED ORDER — AMISULPRIDE (ANTIEMETIC) 5 MG/2ML IV SOLN: 10.0000 mg | Freq: Once | INTRAVENOUS | Status: DC | PRN

## 2020-08-29 MED ORDER — OXYCODONE HCL 5 MG/5ML PO SOLN: 5.0000 mg | Freq: Once | ORAL | Status: DC | PRN

## 2020-08-29 MED ORDER — POLYETHYLENE GLYCOL 3350 17 G PO PACK
17.0000 g | PACK | Freq: Every day | ORAL | Status: DC | PRN
  Administered 2020-08-30: 17 g via ORAL
  Filled 2020-08-29: qty 1

## 2020-08-29 MED ORDER — ASCORBIC ACID 500 MG PO TABS: 1000.0000 mg | ORAL_TABLET | Freq: Every day | ORAL | Status: DC

## 2020-08-29 MED ORDER — FENTANYL CITRATE (PF) 100 MCG/2ML IJ SOLN
INTRAMUSCULAR | Status: DC | PRN
  Administered 2020-08-29: 100 ug via INTRAVENOUS

## 2020-08-29 MED ORDER — DOCUSATE SODIUM 100 MG PO CAPS
100.0000 mg | ORAL_CAPSULE | Freq: Two times a day (BID) | ORAL | Status: DC
  Administered 2020-08-29 – 2020-08-31 (×4): 100 mg via ORAL
  Filled 2020-08-29 (×4): qty 1

## 2020-08-29 MED ORDER — POVIDONE-IODINE 10 % EX SWAB
2.0000 "application " | Freq: Once | CUTANEOUS | Status: AC
  Administered 2020-08-29: 2 via TOPICAL

## 2020-08-29 MED ORDER — ONDANSETRON HCL 4 MG/2ML IJ SOLN
4.0000 mg | Freq: Four times a day (QID) | INTRAMUSCULAR | Status: DC | PRN
  Administered 2020-08-29: 4 mg via INTRAVENOUS
  Filled 2020-08-29: qty 2

## 2020-08-29 MED ORDER — LACTATED RINGERS IV SOLN: INTRAVENOUS | Status: DC

## 2020-08-29 MED ORDER — MIDAZOLAM HCL 5 MG/5ML IJ SOLN
INTRAMUSCULAR | Status: DC | PRN
  Administered 2020-08-29: 2 mg via INTRAVENOUS

## 2020-08-29 MED ORDER — FENTANYL CITRATE (PF) 100 MCG/2ML IJ SOLN: 25.0000 ug | INTRAMUSCULAR | Status: DC | PRN

## 2020-08-29 MED ORDER — PROPOFOL 500 MG/50ML IV EMUL
INTRAVENOUS | Status: DC | PRN
  Administered 2020-08-29: 50 ug/kg/min via INTRAVENOUS

## 2020-08-29 MED ORDER — BUPIVACAINE IN DEXTROSE 0.75-8.25 % IT SOLN
INTRATHECAL | Status: DC | PRN
  Administered 2020-08-29: 1.8 mL via INTRATHECAL

## 2020-08-29 SURGICAL SUPPLY — 43 items
APL SKNCLS STERI-STRIP NONHPOA (GAUZE/BANDAGES/DRESSINGS)
BAG COUNTER SPONGE SURGICOUNT (BAG) ×2 IMPLANT
BAG SPEC THK2 15X12 ZIP CLS (MISCELLANEOUS) ×1
BAG SPNG CNTER NS LX DISP (BAG) ×1
BAG ZIPLOCK 12X15 (MISCELLANEOUS) ×1 IMPLANT
BENZOIN TINCTURE PRP APPL 2/3 (GAUZE/BANDAGES/DRESSINGS) IMPLANT
BLADE SAW SGTL 18X1.27X75 (BLADE) ×2 IMPLANT
COVER PERINEAL POST (MISCELLANEOUS) ×2 IMPLANT
COVER SURGICAL LIGHT HANDLE (MISCELLANEOUS) ×2 IMPLANT
CUP SECTOR GRIPTON 50MM (Cup) ×1 IMPLANT
DRAPE FOOT SWITCH (DRAPES) ×2 IMPLANT
DRAPE STERI IOBAN 125X83 (DRAPES) ×2 IMPLANT
DRAPE U-SHAPE 47X51 STRL (DRAPES) ×4 IMPLANT
DRSG AQUACEL AG ADV 3.5X10 (GAUZE/BANDAGES/DRESSINGS) ×2 IMPLANT
DURAPREP 26ML APPLICATOR (WOUND CARE) ×2 IMPLANT
ELECT REM PT RETURN 15FT ADLT (MISCELLANEOUS) ×2 IMPLANT
GAUZE XEROFORM 1X8 LF (GAUZE/BANDAGES/DRESSINGS) ×1 IMPLANT
GLOVE SRG 8 PF TXTR STRL LF DI (GLOVE) ×2 IMPLANT
GLOVE SURG ENC MOIS LTX SZ7.5 (GLOVE) ×2 IMPLANT
GLOVE SURG LTX SZ8 (GLOVE) ×2 IMPLANT
GLOVE SURG UNDER POLY LF SZ8 (GLOVE) ×4
GOWN STRL REUS W/TWL XL LVL3 (GOWN DISPOSABLE) ×4 IMPLANT
HANDPIECE INTERPULSE COAX TIP (DISPOSABLE) ×2
HEAD FEMORAL 32 CERAMIC (Hips) ×1 IMPLANT
HOLDER FOLEY CATH W/STRAP (MISCELLANEOUS) ×2 IMPLANT
KIT TURNOVER KIT A (KITS) ×2 IMPLANT
LINER ACETABULAR 32X50 (Liner) ×1 IMPLANT
PACK ANTERIOR HIP CUSTOM (KITS) ×2 IMPLANT
PENCIL SMOKE EVACUATOR (MISCELLANEOUS) IMPLANT
SET HNDPC FAN SPRY TIP SCT (DISPOSABLE) ×1 IMPLANT
SPONGE T-LAP 18X18 ~~LOC~~+RFID (SPONGE) ×3 IMPLANT
STAPLER VISISTAT 35W (STAPLE) ×1 IMPLANT
STEM CORAIL KLA10 (Stem) ×1 IMPLANT
STRIP CLOSURE SKIN 1/2X4 (GAUZE/BANDAGES/DRESSINGS) IMPLANT
SUT ETHIBOND NAB CT1 #1 30IN (SUTURE) ×2 IMPLANT
SUT ETHILON 2 0 PS N (SUTURE) IMPLANT
SUT MNCRL AB 4-0 PS2 18 (SUTURE) IMPLANT
SUT VIC AB 0 CT1 36 (SUTURE) ×2 IMPLANT
SUT VIC AB 1 CT1 36 (SUTURE) ×2 IMPLANT
SUT VIC AB 2-0 CT1 27 (SUTURE) ×4
SUT VIC AB 2-0 CT1 TAPERPNT 27 (SUTURE) ×2 IMPLANT
TRAY FOLEY MTR SLVR 14FR STAT (SET/KITS/TRAYS/PACK) ×1 IMPLANT
TRAY FOLEY MTR SLVR 16FR STAT (SET/KITS/TRAYS/PACK) IMPLANT

## 2020-08-29 NOTE — Brief Op Note (Signed)
08/29/2020  1:39 PM  PATIENT:  Lindsay [name]  75 y.o. female  PRE-OPERATIVE DIAGNOSIS:  Left Hip Osteoarthritis  POST-OPERATIVE DIAGNOSIS:  Left Hip Osteoarthritis  PROCEDURE:  Procedure(s): LEFT TOTAL HIP ARTHROPLASTY ANTERIOR APPROACH (Left)  SURGEON:  Surgeon(s) and Role:    Mcarthur Rossetti, MD - Primary  PHYSICIAN ASSISTANT: Benita Stabile, PA-C  ANESTHESIA:   spinal  EBL:  150 mL   COUNTS:  YES  DICTATION: .Other Dictation: Dictation Number [id number]  PLAN OF CARE: Admit for overnight observation  PATIENT DISPOSITION:  PACU - hemodynamically stable.   Delay start of Pharmacological VTE agent (>24hrs) due to surgical blood loss or risk of bleeding: no

## 2020-08-29 NOTE — Interval H&P Note (Signed)
History and Physical Interval Note: The patient is here today for a left total hip replacement to treat her left hip osteoarthritis.  There has been no interval or acute change in her medical status.  She understands why we have recommended the surgery and does wish to proceed with it given the severity of her left hip pain and the arthritis on clinical exam and x-rays.  See recent H&P.  The risks and benefits of surgery been explained in detail and informed consent is obtained.  The left hip has been marked.  08/29/2020 10:37 AM  Lindsay [name]  has presented today for surgery, with the diagnosis of Left Hip Osteoarthritis.  The various methods of treatment have been discussed with the patient and family. After consideration of risks, benefits and other options for treatment, the patient has consented to  Procedure(s): LEFT TOTAL HIP ARTHROPLASTY ANTERIOR APPROACH (Left) as a surgical intervention.  The patient's history has been reviewed, patient examined, no change in status, stable for surgery.  I have reviewed the patient's chart and labs.  Questions were answered to the patient's satisfaction.     Mcarthur Rossetti

## 2020-08-29 NOTE — Anesthesia Procedure Notes (Signed)
Spinal  Patient location during procedure: OR Reason for block: surgical anesthesia Staffing Performed: anesthesiologist  Anesthesiologist: Lidia Collum, MD Resident/CRNA: Sharlette Dense, CRNA Preanesthetic Checklist Completed: patient identified, IV checked, risks and benefits discussed, surgical consent, monitors and equipment checked, pre-op evaluation and timeout performed Spinal Block Patient position: sitting Prep: DuraPrep and site prepped and draped Patient monitoring: continuous pulse ox, blood pressure and heart rate Approach: midline Location: L3-4 Injection technique: single-shot Needle Needle type: Pencan  Needle gauge: 24 G Needle length: 9 cm Assessment Events: CSF return and second provider Additional Notes Functioning IV was confirmed and monitors were applied. Sterile prep and drape, including hand hygiene and sterile gloves were used. The patient was positioned and the spine was prepped. The skin was anesthetized with lidocaine.  First attempt by CRNA, second attempt by MD. Free flow of clear CSF was obtained prior to injecting local anesthetic into the CSF. The needle was carefully withdrawn. The patient tolerated the procedure well.

## 2020-08-29 NOTE — Anesthesia Postprocedure Evaluation (Signed)
Anesthesia Post Note  Patient: Lindsay [name]  Procedure(s) Performed: LEFT TOTAL HIP ARTHROPLASTY ANTERIOR APPROACH (Left: Hip)     Patient location during evaluation: PACU Anesthesia Type: Spinal Level of consciousness: oriented and awake and alert Pain management: pain level controlled Vital Signs Assessment: post-procedure vital signs reviewed and stable Respiratory status: spontaneous breathing, respiratory function stable and nonlabored ventilation Cardiovascular status: blood pressure returned to baseline and stable Postop Assessment: no headache, no backache, no apparent nausea or vomiting and spinal receding Anesthetic complications: no   No notable events documented.  Last Vitals:  Vitals:   08/29/20 1430 08/29/20 1445  BP: (!) 101/57 (!) 110/58  Pulse: (!) 59 (!) 58  Resp: (!) 22 11  Temp:    SpO2: 100% 99%    Last Pain:  Vitals:   08/29/20 1430  TempSrc:   PainSc: 0-No pain                 Lidia Collum

## 2020-08-29 NOTE — Anesthesia Preprocedure Evaluation (Signed)
Anesthesia Evaluation  Patient identified by MRN, date of birth, ID band Patient awake    Reviewed: Allergy & Precautions, NPO status , Patient's Chart, lab work & pertinent test results  History of Anesthesia Complications Negative for: history of anesthetic complications  Airway Mallampati: II  TM Distance: >3 FB Neck ROM: Full    Dental   Pulmonary neg pulmonary ROS, former smoker,    Pulmonary exam normal        Cardiovascular Normal cardiovascular exam+ dysrhythmias (s/p ablation) Supra Ventricular Tachycardia      Neuro/Psych negative neurological ROS     GI/Hepatic negative GI ROS, Neg liver ROS,   Endo/Other  Hypothyroidism   Renal/GU negative Renal ROS  negative genitourinary   Musculoskeletal  (+) Arthritis , Fibromyalgia -  Abdominal   Peds  Hematology negative hematology ROS (+)   Anesthesia Other Findings   Reproductive/Obstetrics                            Anesthesia Physical Anesthesia Plan  ASA: 2  Anesthesia Plan: Spinal   Post-op Pain Management:    Induction:   PONV Risk Score and Plan: 2 and Propofol infusion, Treatment may vary due to age or medical condition, Ondansetron and TIVA  Airway Management Planned: Nasal Cannula and Simple Face Mask  Additional Equipment: None  Intra-op Plan:   Post-operative Plan:   Informed Consent: I have reviewed the patients History and Physical, chart, labs and discussed the procedure including the risks, benefits and alternatives for the proposed anesthesia with the patient or authorized representative who has indicated his/her understanding and acceptance.       Plan Discussed with:   Anesthesia Plan Comments:         Anesthesia Quick Evaluation

## 2020-08-29 NOTE — Transfer of Care (Signed)
Immediate Anesthesia Transfer of Care Note  Patient: Lindsay [name]  Procedure(s) Performed: LEFT TOTAL HIP ARTHROPLASTY ANTERIOR APPROACH (Left: Hip)  Patient Location: PACU  Anesthesia Type:Spinal  Level of Consciousness: awake and alert   Airway & Oxygen Therapy: Patient Spontanous Breathing and Patient connected to face mask oxygen  Post-op Assessment: Report given to RN and Post -op Vital signs reviewed and stable  Post vital signs: Reviewed and stable  Last Vitals:  Vitals Value Taken Time  BP 110/60 08/29/20 1400  Temp    Pulse 59 08/29/20 1402  Resp 17 08/29/20 1402  SpO2 100 % 08/29/20 1402  Vitals shown include unvalidated device data.  Last Pain:  Vitals:   08/29/20 0925  TempSrc: Oral      Patients Stated Pain Goal: 4 (62/26/33 3545)  Complications: No notable events documented.

## 2020-08-29 NOTE — Op Note (Signed)
NAMEAARON, BOEH MEDICAL RECORD NO: [id number] ACCOUNT NO: [id number] DATE OF BIRTH: 08-09-1945 FACILITY: [facility] LOCATION: [id number] PHYSICIAN: Lind Guest. Ninfa Linden, MD  Operative Report   DATE OF PROCEDURE: 08/29/2020  PREOPERATIVE DIAGNOSES:  Primary osteoarthritis and degenerative joint disease, left hip.  POSTOPERATIVE DIAGNOSES:  Primary osteoarthritis and degenerative joint disease, left hip.  PROCEDURE:  Left total hip arthroplasty, direct anterior approach.  IMPLANTS:  DePuy Sector Gription acetabular component size 50, size 32+0 neutral polyethylene liner, size 10 Corail femoral component with a varus offset, size 32+1 ceramic hip ball.  SURGEON:  Lind Guest. Ninfa Linden, MD  ASSISTANT: Erskine Emery, PA-C  ANESTHESIA:  Spinal.  ANTIBIOTICS:  2 g IV Ancef.  BLOOD LOSS:  150 mL.  COMPLICATIONS:  None.  INDICATIONS:  The patient is a 75 year old female well known to me.  She has developed debilitating arthritis involving her left hip and at this point, it is detrimentally affecting her activities of daily living, her quality of life and her mobility.   Her pain is daily and at this point, she does wish to proceed with a total hip arthroplasty and we agreed with this as well.  We described to her the risk of acute blood loss anemia, nerve and vessel injury, fracture, infection, DVT, implant failure and  skin and soft tissue issues as well as dislocation.  She understands our goals are to decrease pain, improve mobility and overall improve quality of life.  DESCRIPTION OF PROCEDURE:  After informed consent was obtained, appropriate left hip was marked.  She was brought to the operating room and sat up on the stretcher where spinal anesthesia was obtained.  She was laid in the supine position on the  stretcher.  Foley catheter was placed.  I assessed her leg lengths and found them to be equal.  Traction boots were placed on both her feet and she was placed supine on the Hana  fracture table, the perineal post in place and both legs in line skeletal  traction device and no traction applied.  Her left operative hip was prepped and draped with DuraPrep and sterile drapes.  A timeout was called and she was identified correct patient, correct left hip.  We then made an incision just inferior and  posterior to the anterior superior iliac spine and carried this distally slightly obliquely down the leg.  We dissected down tensor fascia lata muscle.  Tensor fascia was then divided longitudinally to proceed with direct anterior approach to the hip.   We identified and cauterized circumflex vessels and then identified the hip capsule, opened the hip capsule in L-type format finding a moderate joint effusion.  We placed Cobra retractors in the medial or lateral joint capsule and made our femoral neck  cut with an oscillating saw just proximal to the lesser trochanter and completed this with an osteotome.  We placed a corkscrew guide in the femoral head and removed the femoral head in its entirety and found a wide area with significant cartilage loss.   We then placed a bent Hohmann over the medial acetabular rim and removed remnants of acetabular labrum and other debris.  We then began reaming under direct visualization from a size 43 reamer in stepwise increments up to a size 49, with all reamers  placed under direct visualization, the last reamer was placed under direct fluoroscopy, so we could obtain our depth of reaming, our inclination and anteversion.  I then placed a real DePuy Sector Gription acetabular component size 50 and  a 32+0 neutral  polyethylene liner for that size 50 acetabular component.  Attention was then turned to the femur. With the leg externally rotated to 120 degrees, extended and adducted, we are to place a Mueller retractor medially and Hohmann retractor behind the  greater trochanter.  We released lateral joint capsule and used a box cutting osteotome to enter the  femoral canal and a rongeur to lateralize, then began broaching using the Corail broaching system from a size 8, going up to a size 10. With the size 10  in place, we trialed a standard offset femoral neck and a 32+1 hip ball.  We reduced this in the acetabulum, it was very stable on exam, but we definitely needed to go with a shorter leg length based on our clinical exam and trialing.  We dislocated the  hip, and we decided to go with the real femoral component, but we went with a varus offset femoral component size 10.  We placed that down in the acetabulum and went with a 32+1 ceramic hip ball and reduced this in the acetabulum.  At that point, we were  pleased with our leg lengths, offset, range of motion and stability, assessed mechanically and radiographically.  We then irrigated the soft tissue with normal saline solution using pulsatile lavage.  We closed the joint capsule with interrupted #1  Ethibond suture followed by #1 Vicryl to close the tensor fascia, 0 Vicryl to close the deep tissue and 2-0 Vicryl to close the subcutaneous tissue.  The skin was closed with staples.  A well-padded sterile dressing was applied.  She was taken to  recovery room in stable condition with all final counts being correct.  No complications noted.  Of note, Benita Stabile, PA-C, did assist during the entire case and assistance was crucial for facilitating all aspects of this case.   SHW D: 08/29/2020 1:37:43 pm T: 08/29/2020 11:17:00 pm  JOB: [id number]/ [id number]

## 2020-08-30 DIAGNOSIS — M1612 Unilateral primary osteoarthritis, left hip: Secondary | ICD-10-CM | POA: Diagnosis not present

## 2020-08-30 DIAGNOSIS — E039 Hypothyroidism, unspecified: Secondary | ICD-10-CM | POA: Diagnosis not present

## 2020-08-30 DIAGNOSIS — Z7982 Long term (current) use of aspirin: Secondary | ICD-10-CM | POA: Diagnosis not present

## 2020-08-30 DIAGNOSIS — Z87891 Personal history of nicotine dependence: Secondary | ICD-10-CM | POA: Diagnosis not present

## 2020-08-30 DIAGNOSIS — Z79899 Other long term (current) drug therapy: Secondary | ICD-10-CM | POA: Diagnosis not present

## 2020-08-30 LAB — BASIC METABOLIC PANEL
Anion gap: 4 — ABNORMAL LOW (ref 5–15)
BUN: 16 mg/dL (ref 8–23)
CO2: 27 mmol/L (ref 22–32)
Calcium: 8.5 mg/dL — ABNORMAL LOW (ref 8.9–10.3)
Chloride: 105 mmol/L (ref 98–111)
Creatinine, Ser: 0.69 mg/dL (ref 0.44–1.00)
GFR, Estimated: >60 mL/min (ref >60)
Glucose, Bld: 125 mg/dL — ABNORMAL HIGH (ref 70–99)
Potassium: 4.5 mmol/L (ref 3.5–5.1)
Sodium: 136 mmol/L (ref 135–145)

## 2020-08-30 LAB — CBC
HCT: 31.7 % — ABNORMAL LOW (ref 36.0–46.0)
Hemoglobin: 10.2 g/dL — ABNORMAL LOW (ref 12.0–15.0)
MCH: 31.4 pg (ref 26.0–34.0)
MCHC: 32.2 g/dL (ref 30.0–36.0)
MCV: 97.5 fL (ref 80.0–100.0)
Platelets: 224 10*3/uL (ref 150–400)
RBC: 3.25 MIL/uL — ABNORMAL LOW (ref 3.87–5.11)
RDW: 12.7 % (ref 11.5–15.5)
WBC: 12.3 10*3/uL — ABNORMAL HIGH (ref 4.0–10.5)
nRBC: 0 % (ref 0.0–0.2)

## 2020-08-30 NOTE — Discharge Instructions (Signed)

## 2020-08-30 NOTE — Progress Notes (Signed)
Physical Therapy Treatment Patient Details Name: Lindsay [name] MRN: [id number] DOB: [birth year] Today's Date: 08/30/2020    History of Present Illness 75 yo female s/p L THA-DA 08/29/20.    PT Comments    Progressing with mobility. Pt is planning to d/c to her son's home in [place]. Pt stated she hasn't had an opportunity to speak with a CM yet concerning d/c plans/follow up PT. Pt is unsure if she will be able to have HHPT arranged. Will issue her a HEP to follow in case f/u PT setup is an issue.     Follow Up Recommendations  Home health PT vs HEP?     Equipment Recommendations  None recommended by PT    Recommendations for Other Services       Precautions / Restrictions Precautions Precautions: Fall Restrictions Weight Bearing Restrictions: No Other Position/Activity Restrictions: WBAT    Mobility  Bed Mobility Overal bed mobility: Needs Assistance Bed Mobility: Sit to Supine     Supine to sit: Min assist;HOB elevated Sit to supine: Min assist;HOB elevated   General bed mobility comments: Assist for L LE. Increased time. Cues for safety, technique. Had pt practice using gait belt as leg lifter.    Transfers Overall transfer level: Needs assistance Equipment used: Rolling walker (2 wheeled) Transfers: Sit to/from Stand Sit to Stand: Min assist         General transfer comment: Assist to power up, steady, control descent. Cues for safety, technique, hand placement.  Ambulation/Gait Ambulation/Gait assistance: Min assist Gait Distance (Feet): 75 Feet Assistive device: Rolling walker (2 wheeled) Gait Pattern/deviations: Step-to pattern;Step-through pattern;Decreased stride length     General Gait Details: Assist to steady intermittently. Pt tolerated increased distance well.   Stairs             Wheelchair Mobility    Modified Rankin (Stroke Patients Only)       Balance Overall balance assessment: Needs assistance         Standing balance  support: Bilateral upper extremity supported Standing balance-Leahy Scale: Poor                              Cognition Arousal/Alertness: Awake/alert Behavior During Therapy: WFL for tasks assessed/performed Overall Cognitive Status: Within Functional Limits for tasks assessed                                        Exercises Total Joint Exercises Ankle Circles/Pumps: AROM;Both;10 reps Quad Sets: AROM;Both;10 reps Heel Slides: AAROM;Left;10 reps Hip ABduction/ADduction: AAROM;Left;10 reps    General Comments        Pertinent Vitals/Pain Pain Assessment: 0-10 Pain Score: 6  Pain Location: L hip/thigh Pain Descriptors / Indicators: Discomfort;Sore;Grimacing Pain Intervention(s): Limited activity within patient's tolerance;Monitored during session;Repositioned    Home Living Family/patient expects to be discharged to:: Private residence Living Arrangements:  (planning to d/c to son's home in [place] while recovering) Available Help at Discharge: Family Type of Home: House Home Access: Stairs to enter;Ramped entrance   Home Layout: One level Home Equipment: Environmental consultant - 2 wheels;Cane - single point;Bedside commode      Prior Function Level of Independence: Independent          PT Goals (current goals can now be found in the care plan section) Acute Rehab PT Goals Patient Stated Goal: regain PLOf/independence PT Goal Formulation:  With patient Time For Goal Achievement: 09/13/20 Potential to Achieve Goals: Good Progress towards PT goals: Progressing toward goals    Frequency    7X/week      PT Plan Current plan remains appropriate    Co-evaluation              AM-PAC PT "6 Clicks" Mobility   Outcome Measure  Help needed turning from your back to your side while in a flat bed without using bedrails?: A Little Help needed moving from lying on your back to sitting on the side of a flat bed without using bedrails?: A Little Help  needed moving to and from a bed to a chair (including a wheelchair)?: A Little Help needed standing up from a chair using your arms (e.g., wheelchair or bedside chair)?: A Little Help needed to walk in hospital room?: A Little Help needed climbing 3-5 steps with a railing? : A Little 6 Click Score: 18    End of Session Equipment Utilized During Treatment: Gait belt Activity Tolerance: Patient tolerated treatment well Patient left: in bed;with call bell/phone within reach;with bed alarm set   PT Visit Diagnosis: Other abnormalities of gait and mobility (R26.89);Pain Pain - Right/Left: Left Pain - part of body: Hip     Time: 2878-6767 PT Time Calculation (min) (ACUTE ONLY): 17 min  Charges:  $Gait Training: 8-22 mins                         Doreatha Massed, PT Acute Rehabilitation  Office: [phone] Pager: [phone]

## 2020-08-30 NOTE — Progress Notes (Signed)
Subjective: 1 Day Post-Op Procedure(s) (LRB): LEFT TOTAL HIP ARTHROPLASTY ANTERIOR APPROACH (Left) Patient reports pain as moderate.  Reports that she is struggling with her pain and mobility.  She states that PT feels like one more day her today would be appropriate since she will be going home out of state with her son.  Objective: Vital signs in last 24 hours: Temp:  [97.4 F (36.3 C)-99.2 F (37.3 C)] 99.2 F (37.3 C) (07/02 0611) Pulse Rate:  [58-81] 81 (07/02 0948) Resp:  [11-20] 18 (07/02 0948) BP: (97-124)/(54-67) 122/60 (07/02 0948) SpO2:  [95 %-100 %] 95 % (07/02 0948)  Intake/Output from previous day: 07/01 0701 - 07/02 0700 In: 3293.5 [P.O.:900; I.V.:2193.5; IV Piggyback:200] Out: 2050 [Urine:1900; Blood:150] Intake/Output this shift: Total I/O In: 434 [P.O.:420; I.V.:14] Out: 300 [Urine:300]  Recent Labs    08/30/20 0321  HGB 10.2*   Recent Labs    08/30/20 0321  WBC 12.3*  RBC 3.25*  HCT 31.7*  PLT 224   Recent Labs    08/30/20 0321  NA 136  K 4.5  CL 105  CO2 27  BUN 16  CREATININE 0.69  GLUCOSE 125*  CALCIUM 8.5*   No results for input(s): LABPT, INR in the last 72 hours.  Sensation intact distally Intact pulses distally Dorsiflexion/Plantar flexion intact Incision: scant drainage   Assessment/Plan: 1 Day Post-Op Procedure(s) (LRB): LEFT TOTAL HIP ARTHROPLASTY ANTERIOR APPROACH (Left) Up with therapy Plan for discharge tomorrow      Mcarthur Rossetti 08/30/2020, 12:28 PM

## 2020-08-30 NOTE — Evaluation (Signed)
Physical Therapy Evaluation Patient Details Name: Lindsay [name] MRN: [id number] DOB: 11/15/1945 Today's Date: 08/30/2020   History of Present Illness  75 yo female s/p L THA-DA 08/29/20.  Clinical Impression  On eval, pt required Min A for mobility. She walked ~50 feet with a RW. Moderate pain with activity. Will follow and progress activity as tolerated.     Follow Up Recommendations Home health PT (vs HEP)    Equipment Recommendations  None recommended by PT    Recommendations for Other Services       Precautions / Restrictions Precautions Precautions: Fall Restrictions Weight Bearing Restrictions: No Other Position/Activity Restrictions: WBAT      Mobility  Bed Mobility Overal bed mobility: Needs Assistance Bed Mobility: Supine to Sit     Supine to sit: Min assist;HOB elevated     General bed mobility comments: Assist for L LE. Increased time. Cues for safety, technique.    Transfers Overall transfer level: Needs assistance Equipment used: Rolling walker (2 wheeled) Transfers: Sit to/from Stand Sit to Stand: Min assist         General transfer comment: Assist to power up, steady, control descent. Cues for safety, technique, hand placement.  Ambulation/Gait Ambulation/Gait assistance: Min assist Gait Distance (Feet): 50 Feet Assistive device: Rolling walker (2 wheeled) Gait Pattern/deviations: Step-to pattern     General Gait Details: Assist to steady intermittently. Slow gait speed. Pt c/o fatigue and discomfort from L hip and bil shoulders.  Stairs            Wheelchair Mobility    Modified Rankin (Stroke Patients Only)       Balance Overall balance assessment: Needs assistance         Standing balance support: Bilateral upper extremity supported Standing balance-Leahy Scale: Poor                               Pertinent Vitals/Pain Pain Assessment: 0-10 Pain Score: 6  Pain Location: L hip/thigh Pain Descriptors /  Indicators: Discomfort;Sore;Grimacing Pain Intervention(s): Limited activity within patient's tolerance;Monitored during session;Repositioned    Home Living Family/patient expects to be discharged to:: Private residence Living Arrangements:  (planning to d/c to son's home in [place] while recovering) Available Help at Discharge: Family Type of Home: House Home Access: Stairs to enter;Ramped entrance   Entrance Stairs-Number of Steps: 1 Home Layout: One level Home Equipment: Environmental consultant - 2 wheels;Cane - single point;Bedside commode      Prior Function Level of Independence: Independent               Hand Dominance        Extremity/Trunk Assessment   Upper Extremity Assessment Upper Extremity Assessment: Overall WFL for tasks assessed    Lower Extremity Assessment Lower Extremity Assessment: Generalized weakness    Cervical / Trunk Assessment Cervical / Trunk Assessment: Normal  Communication   Communication: No difficulties  Cognition Arousal/Alertness: Awake/alert Behavior During Therapy: WFL for tasks assessed/performed Overall Cognitive Status: Within Functional Limits for tasks assessed                                        General Comments      Exercises     Assessment/Plan    PT Assessment Patient needs continued PT services  PT Problem List Decreased strength;Decreased mobility;Decreased range of motion;Decreased activity tolerance;Decreased balance;Decreased knowledge  of use of DME;Pain       PT Treatment Interventions DME instruction;Gait training;Therapeutic exercise;Balance training;Stair training;Functional mobility training;Therapeutic activities;Patient/family education    PT Goals (Current goals can be found in the Care Plan section)  Acute Rehab PT Goals Patient Stated Goal: regain PLOf/independence PT Goal Formulation: With patient Time For Goal Achievement: 09/13/20 Potential to Achieve Goals: Good    Frequency 7X/week    Barriers to discharge        Co-evaluation               AM-PAC PT "6 Clicks" Mobility  Outcome Measure Help needed turning from your back to your side while in a flat bed without using bedrails?: A Little Help needed moving from lying on your back to sitting on the side of a flat bed without using bedrails?: A Little Help needed moving to and from a bed to a chair (including a wheelchair)?: A Little Help needed standing up from a chair using your arms (e.g., wheelchair or bedside chair)?: A Little Help needed to walk in hospital room?: A Little Help needed climbing 3-5 steps with a railing? : A Little 6 Click Score: 18    End of Session Equipment Utilized During Treatment: Gait belt Activity Tolerance: Patient tolerated treatment well Patient left: in chair;with call bell/phone within reach   PT Visit Diagnosis: Other abnormalities of gait and mobility (R26.89);Pain Pain - Right/Left: Left Pain - part of body: Hip    Time: 2924-4628 PT Time Calculation (min) (ACUTE ONLY): 17 min   Charges:   PT Evaluation $PT Eval Low Complexity: 1 Low             Doreatha Massed, PT Acute Rehabilitation  Office: [phone] Pager: [phone]

## 2020-08-31 DIAGNOSIS — M1612 Unilateral primary osteoarthritis, left hip: Secondary | ICD-10-CM | POA: Diagnosis not present

## 2020-08-31 DIAGNOSIS — E039 Hypothyroidism, unspecified: Secondary | ICD-10-CM | POA: Diagnosis not present

## 2020-08-31 DIAGNOSIS — Z79899 Other long term (current) drug therapy: Secondary | ICD-10-CM | POA: Diagnosis not present

## 2020-08-31 DIAGNOSIS — Z87891 Personal history of nicotine dependence: Secondary | ICD-10-CM | POA: Diagnosis not present

## 2020-08-31 DIAGNOSIS — Z7982 Long term (current) use of aspirin: Secondary | ICD-10-CM | POA: Diagnosis not present

## 2020-08-31 NOTE — Progress Notes (Signed)
Patient ID: Lindsay [name], female   DOB: 1946-01-24, 75 y.o.   MRN: [id number] Feeling better today and looks good overall.  Vitals stable.  Can be discharged to home today.

## 2020-08-31 NOTE — Progress Notes (Signed)
The patient is alert and oriented and has been seen by her physician. The orders for discharge were written. IV has been removed. Went over discharge instructions with patient. She is being discharged via wheelchair with all of her belongings.   

## 2020-08-31 NOTE — Plan of Care (Signed)
  Problem: Education: Goal: Knowledge of General Education information will improve Description Including pain rating scale, medication(s)/side effects and non-pharmacologic comfort measures Outcome: Progressing   Problem: Health Behavior/Discharge Planning: Goal: Ability to manage health-related needs will improve Outcome: Progressing   

## 2020-08-31 NOTE — Discharge Summary (Signed)
Patient ID: Lindsay [name] MRN: [id number] DOB/AGE: 10/25/1945 75 y.o.  Admit date: 08/29/2020 Discharge date: 08/31/2020  Admission Diagnoses:  Principal Problem:   Unilateral primary osteoarthritis, left hip Active Problems:   Status post left hip replacement   Discharge Diagnoses:  Same  Past Medical History:  Diagnosis Date   Arthritis    Dysrhythmia 2014   SVT. ablation done. no problems since   Fibromyalgia    Heart murmur 2019   Hepatitis 1967   in college with mononucleosis   Herpes 1984   Hypothyroidism    Multinodular goiter    Osteopenia    S/P AV nodal ablation 08/05/2011   SVT (supraventricular tachycardia) (HCC)    Thyroid disease    thyroid nodule being monitored by Dr. Chalmers Cater    Surgeries: Procedure(s): LEFT TOTAL HIP ARTHROPLASTY ANTERIOR APPROACH on 08/29/2020   Consultants:   Discharged Condition: Improved  Hospital Course: Lindsay [name] is an 75 y.o. female who was admitted 08/29/2020 for operative treatment ofUnilateral primary osteoarthritis, left hip. Patient has severe unremitting pain that affects sleep, daily activities, and work/hobbies. After pre-op clearance the patient was taken to the operating room on 08/29/2020 and underwent  Procedure(s): LEFT TOTAL HIP ARTHROPLASTY ANTERIOR APPROACH.    Patient was given perioperative antibiotics:  Anti-infectives (From admission, onward)    Start     Dose/Rate Route Frequency Ordered Stop   08/29/20 0930  ceFAZolin (ANCEF) IVPB 2g/100 mL premix        2 g 200 mL/hr over 30 Minutes Intravenous On call to O.R. 08/29/20 2637 08/29/20 1238        Patient was given sequential compression devices, early ambulation, and chemoprophylaxis to prevent DVT.  Patient benefited maximally from hospital stay and there were no complications.    Recent vital signs: Patient Vitals for the past 24 hrs:  BP Temp Temp src Pulse Resp SpO2  08/31/20 0650 119/71 98.5 F (36.9 C) Oral (!) 105 20 94 %  08/30/20 2104  140/72 98.4 F (36.9 C) Oral 94 18 98 %  08/30/20 1343 (!) 117/59 98.3 F (36.8 C) Oral 88 17 100 %     Recent laboratory studies:  Recent Labs    08/30/20 0321  WBC 12.3*  HGB 10.2*  HCT 31.7*  PLT 224  NA 136  K 4.5  CL 105  CO2 27  BUN 16  CREATININE 0.69  GLUCOSE 125*  CALCIUM 8.5*     Discharge Medications:   Allergies as of 08/31/2020       Reactions   Nickel Rash        Medication List     TAKE these medications    amitriptyline 10 MG tablet Commonly known as: ELAVIL Take 1 tablet (10 mg total) by mouth at bedtime.   aspirin EC 325 MG tablet Take 1 tablet (325 mg total) by mouth in the morning and at bedtime.   ELDERBERRY PO Take 5 mLs by mouth at bedtime.   folic acid 858 MCG tablet Commonly known as: FOLVITE Take 800 mcg by mouth daily.   GINSENG PO Take 25 drops by mouth daily.   levothyroxine 25 MCG tablet Commonly known as: SYNTHROID Take 25 mcg by mouth daily.   methocarbamol 500 MG tablet Commonly known as: Robaxin Take 1 tablet (500 mg total) by mouth 3 (three) times daily.   multivitamin tablet Take 1 tablet by mouth daily.   ondansetron 4 MG tablet Commonly known as: ZOFRAN Take 1 tablet (4 mg  total) by mouth every 8 (eight) hours as needed for nausea or vomiting.   oxyCODONE-acetaminophen 5-325 MG tablet Commonly known as: PERCOCET/ROXICET Take 1-2 tablets by mouth every 4 (four) hours as needed for severe pain.   PROBIOTIC FORMULA PO Take 1 capsule by mouth daily. New chapter All-Flora.   sodium fluoride 1.1 % Gel dental gel Commonly known as: FLUORISHIELD Place 1 application onto teeth at bedtime.   SYSTANE COMPLETE OP Place 1 drop into both eyes daily.   tolterodine 4 MG 24 hr capsule Commonly known as: Detrol LA Take 1 capsule (4 mg total) by mouth daily.   TURMERIC PO Take 1 tablet by mouth daily.   vitamin C 1000 MG tablet Take 1,000 mg by mouth at bedtime.   WELLNESS ESSENTIALS PO Take 2 capsules by  mouth daily.   ZINC PO Take 1 tablet by mouth daily.       ASK your doctor about these medications    ibuprofen 800 MG tablet Commonly known as: ADVIL Take 1 tablet (800 mg total) by mouth every 8 (eight) hours as needed.   Vitamin D (Ergocalciferol) 1.25 MG (50000 UNIT) Caps capsule Commonly known as: DRISDOL Take one tablet wkly               Durable Medical Equipment  (From admission, onward)           Start     Ordered   08/29/20 1621  DME 3 n 1  Once        08/29/20 1620   08/29/20 1621  DME Walker rolling  Once       Question Answer Comment  Walker: With 5 Inch Wheels   Patient needs a walker to treat with the following condition Status post left hip replacement      08/29/20 1620            Diagnostic Studies: DG Pelvis Portable  Result Date: 08/29/2020 CLINICAL DATA:  Postop. EXAM: PORTABLE PELVIS 1-2 VIEWS COMPARISON:  Preoperative exam 05/19/2020 FINDINGS: Left hip arthroplasty in expected alignment. There is no periprosthetic lucency or fracture. Recent postsurgical change includes air and edema in the soft tissues and joint space. Skin staples in place. IMPRESSION: Left hip arthroplasty without immediate postoperative complication. Electronically Signed   By: Keith Rake M.D.   On: 08/29/2020 15:07   DG C-Arm 1-60 Min-No Report  Result Date: 08/29/2020 Fluoroscopy was utilized by the requesting physician.  No radiographic interpretation.   DG HIP OPERATIVE UNILAT W OR W/O PELVIS LEFT  Result Date: 08/29/2020 CLINICAL DATA:  Intra op left hip arthroplasty. EXAM: OPERATIVE LEFT HIP (WITH PELVIS IF PERFORMED) TECHNIQUE: Fluoroscopic spot image(s) were submitted for interpretation post-operatively. COMPARISON:  None. FINDINGS: Three fluoroscopic spot views of the pelvis and left hip obtained in the operating room. Left hip arthroplasty in place. Fluoroscopy time 26 seconds. Dose 3.3 mGy. IMPRESSION: Procedural fluoroscopy for left hip arthroplasty.  Electronically Signed   By: Keith Rake M.D.   On: 08/29/2020 15:06    Disposition: Discharge disposition: 01-Home or [place]     Mcarthur Rossetti, MD Follow up in 2 week(s).   Specialty: Orthopedic Surgery Contact information: [name] [place] [address] [phone]                  Signed: Mcarthur Rossetti 08/31/2020, 11:22 AM

## 2020-08-31 NOTE — Progress Notes (Signed)
Physical Therapy Treatment Patient Details Name: Lindsay [name] MRN: [id number] DOB: 11/23/1945 Today's Date: 08/31/2020    History of Present Illness 75 yo female s/p L THA-DA 08/29/20.    PT Comments    Progressing with mobility. Reviewed/practiced exercises and gait training. Pt stated she is planning to use ramp to enter son's home. Issued HEP for pt to follow at home in case HHPT cannot be arranged.    Follow Up Recommendations  Home health PT (will issue HEP if HHPT cannot be arranged since pt is planning to go to [place])     Equipment Recommendations  None recommended by PT    Recommendations for Other Services       Precautions / Restrictions Precautions Precautions: Fall Restrictions Weight Bearing Restrictions: No Other Position/Activity Restrictions: WBAT    Mobility  Bed Mobility Overal bed mobility: Needs Assistance Bed Mobility: Supine to Sit;Sit to Supine     Supine to sit: Min guard    General bed mobility comments: Increased time. Cues for safety, technique. Had pt practice using gait belt as leg lifter.    Transfers Overall transfer level: Needs assistance Equipment used: Rolling walker (2 wheeled) Transfers: Sit to/from Stand Sit to Stand: Min guard         General transfer comment: Min guard A for safety. Cues for safety, technique, hand placement  Ambulation/Gait Ambulation/Gait assistance: Min guard Gait Distance (Feet): 120 Feet Assistive device: Rolling walker (2 wheeled) Gait Pattern/deviations: Step-through pattern;Decreased stride length;Decreased step length - right;Decreased step length - left     General Gait Details: Min guard A for safety. Pt tolerated distance well.   Stairs             Wheelchair Mobility    Modified Rankin (Stroke Patients Only)       Balance Overall balance assessment: Needs assistance         Standing balance support: Bilateral upper extremity supported Standing balance-Leahy  Scale: Poor                              Cognition Arousal/Alertness: Awake/alert Behavior During Therapy: WFL for tasks assessed/performed Overall Cognitive Status: Within Functional Limits for tasks assessed                                        Exercises Total Joint Exercises Ankle Circles/Pumps: AROM;Both;10 reps Quad Sets: AROM;Both;10 reps Heel Slides: AAROM;Left;10 reps Hip ABduction/ADduction: AAROM;Left;10 reps    General Comments        Pertinent Vitals/Pain Pain Assessment: 0-10 Pain Score: 6  Pain Location: L hip/thigh Pain Descriptors / Indicators: Discomfort;Sore;Grimacing Pain Intervention(s): Monitored during session;Repositioned    Home Living                      Prior Function            PT Goals (current goals can now be found in the care plan section) Progress towards PT goals: Progressing toward goals    Frequency    7X/week      PT Plan Current plan remains appropriate    Co-evaluation              AM-PAC PT "6 Clicks" Mobility   Outcome Measure  Help needed turning from your back to your side while in a flat bed without using  bedrails?: A Little Help needed moving from lying on your back to sitting on the side of a flat bed without using bedrails?: A Little Help needed moving to and from a bed to a chair (including a wheelchair)?: A Little Help needed standing up from a chair using your arms (e.g., wheelchair or bedside chair)?: A Little Help needed to walk in hospital room?: A Little Help needed climbing 3-5 steps with a railing? : A Little 6 Click Score: 18    End of Session Equipment Utilized During Treatment: Gait belt Activity Tolerance: Patient tolerated treatment well Patient left: in chair;with call bell/phone within reach   PT Visit Diagnosis: Other abnormalities of gait and mobility (R26.89);Pain Pain - Right/Left: Left Pain - part of body: Hip     Time: 9290-9030 PT  Time Calculation (min) (ACUTE ONLY): 26 min  Charges:  $Gait Training: 8-22 mins $Therapeutic Exercise: 8-22 mins                     Doreatha Massed, PT Acute Rehabilitation  Office: [phone] Pager: [phone]

## 2020-08-31 NOTE — Plan of Care (Signed)
  Problem: Activity: Goal: Ability to avoid complications of mobility impairment will improve Outcome: Progressing   Problem: Pain Management: Goal: Pain level will decrease with appropriate interventions Outcome: Progressing   

## 2020-09-02 ENCOUNTER — Encounter (HOSPITAL_COMMUNITY): Payer: Self-pay | Admitting: Orthopaedic Surgery

## 2020-09-05 ENCOUNTER — Telehealth: Payer: Self-pay

## 2020-09-05 NOTE — Telephone Encounter (Signed)
Patient called asking if swelling was normal. Advised her she just had major SU. To rest, ice elevate leg. She is more than welcome to send [place] picture through mychart. She will call us back if needed.

## 2020-09-11 ENCOUNTER — Encounter: Payer: Medicare Other | Admitting: Orthopaedic Surgery

## 2020-09-15 ENCOUNTER — Encounter: Payer: Self-pay | Admitting: Orthopaedic Surgery

## 2020-09-15 ENCOUNTER — Ambulatory Visit (INDEPENDENT_AMBULATORY_CARE_PROVIDER_SITE_OTHER): Payer: Medicare Other | Admitting: Orthopaedic Surgery

## 2020-09-15 VITALS — Ht 63.0 in | Wt 158.0 lb

## 2020-09-15 DIAGNOSIS — Z96642 Presence of left artificial hip joint: Secondary | ICD-10-CM

## 2020-09-15 NOTE — Progress Notes (Signed)
The patient is 2 weeks status post a left total hip arthroplasty.  She is doing well overall.  She is ambulating with just using a cane.  She is not taking any pain medications at all.  She has been on aspirin twice a day.  She was on 1 baby aspirin prior to surgery.  Her leg lengths are equal.  I did remove her staples from her left hip incision and place Steri-Strips.  There was a moderate seroma and I drained at least 70 cc of fluid off of the hip area.  There was no evidence of infection.  She does not have any calf swelling or pain in any foot and ankle swelling.  She will start increasing her activities as comfort allows.  She can drop from my standpoint.  She will go back to just 1 baby aspirin a day.  I can see her back in 4 weeks to see how she is doing overall but no x-rays are needed.  If she has a reaccumulation of her seroma and it gets uncomfortable she will call to be seen sooner.  All questions and concerns were answered and addressed.

## 2020-10-13 ENCOUNTER — Other Ambulatory Visit: Payer: Self-pay

## 2020-10-13 ENCOUNTER — Encounter: Payer: Self-pay | Admitting: Orthopaedic Surgery

## 2020-10-13 ENCOUNTER — Ambulatory Visit (INDEPENDENT_AMBULATORY_CARE_PROVIDER_SITE_OTHER): Payer: Medicare Other | Admitting: Orthopaedic Surgery

## 2020-10-13 DIAGNOSIS — Z96642 Presence of left artificial hip joint: Secondary | ICD-10-CM

## 2020-10-13 DIAGNOSIS — M25561 Pain in right knee: Secondary | ICD-10-CM

## 2020-10-13 DIAGNOSIS — G8929 Other chronic pain: Secondary | ICD-10-CM

## 2020-10-13 MED ORDER — METHYLPREDNISOLONE ACETATE 40 MG/ML IJ SUSP
40.0000 mg | INTRAMUSCULAR | Status: AC | PRN
  Administered 2020-10-13: 40 mg via INTRA_ARTICULAR

## 2020-10-13 MED ORDER — LIDOCAINE HCL 1 % IJ SOLN
3.0000 mL | INTRAMUSCULAR | Status: AC | PRN
  Administered 2020-10-13: 3 mL

## 2020-10-13 NOTE — Progress Notes (Signed)
Office Visit Note   Patient: Lindsay [name]           Date of Birth: 08-05-1945           MRN: [id number] Visit Date: 10/13/2020              Requested by: Lorrene Reid, PA-C [address],  [place] [id number] PCP: Lorrene Reid, PA-C   Assessment & Plan: Visit Diagnoses:  1. Status post left hip replacement   2. Chronic pain of right knee     Plan: At this point I do not mind providing steroid injection in her right knee today which she agreed to and tolerated well.  From a hip standpoint, we do not need to see her back for 6 months unless she is having issues.  At that visit we will have a standing low AP pelvis and lateral of her left hip.  We can always x-rayed her knees and they are hurting her quite a bit.  Follow-Up Instructions: Return in about 6 months (around 04/15/2021).   Orders:  Orders Placed This Encounter  Procedures   Large Joint Inj   No orders of the defined types were placed in this encounter.     Procedures: Large Joint Inj: R knee on 10/13/2020 1:29 PM Indications: diagnostic evaluation and pain Details: 22 G 1.5 in needle, superolateral approach  Arthrogram: No  Medications: 3 mL lidocaine 1 %; 40 mg methylPREDNISolone acetate 40 MG/ML Outcome: tolerated well, no immediate complications Procedure, treatment alternatives, risks and benefits explained, specific risks discussed. Consent was given by the patient. Immediately prior to procedure a time out was called to verify the correct patient, procedure, equipment, support staff and site/side marked as required. Patient was prepped and draped in the usual sterile fashion.      Clinical Data: No additional findings.   Subjective: Chief Complaint  Patient presents with   Left Hip - Follow-up  The patient is now 6-week status post a left total hip arthroplasty.  His left hip is doing well overall and she reports increased range of motion and strength.  She does have issue  with both her knees hurting.  She is requesting a steroid injection in her right knee today which I think is reasonable now that she is 6 weeks out from surgery.  She is walking without an assistive device and is doing well overall.  She does have a slight limp to her gait when she first gets up from a sitting position.  I have told her this is normal and will hopefully improve the further she gets out from surgery.  At her last visit we aspirated a large seroma which is since dissipated and terms of getting less to the point she does not any type of aspiration she states.  HPI  Review of Systems There is currently listed no headache, chest pain, shortness of breath, fever, chills, nausea, vomiting  Objective: Vital Signs: There were no vitals taken for this visit.  Physical Exam She is alert and orient x3 and in no acute distress Ortho Exam Examination of her left operative hip shows good range of motion of the hip.  Examination of the right knee shows some global tenderness but good range of motion. Specialty Comments:  No specialty comments available.  Imaging: No results found.   PMFS History: Patient Active Problem List   Diagnosis Date Noted   Status post left hip replacement 08/29/2020   Unilateral primary osteoarthritis,  left hip 08/28/2020   High serum low-density lipoprotein (LDL) 11/30/2018   Vitamin D insufficiency 11/30/2018   High serum high density lipoprotein (HDL) 11/30/2018   newer onset Exercise intolerance * 4-6 mo 08/15/2017   Complaints of transient weakness of bilateral lower extremity 08/15/2017   Transient episodes of numbness of both lower extremities 08/15/2017   Atypical nevus of back 08/07/2017   Reflux esophagitis 08/02/2017   Persistent dry cough 08/02/2017   Muscle spasm of lower bilateral back 08/02/2017   Muscle spasms of neck 08/02/2017   Hoarseness or changing voice 08/02/2017   Left knee pain 06/30/2017   Osteoarthritis of knee 06/20/2017    Cough in adult 03/07/2017   Acute maxillary sinusitis 03/07/2017   Chronic pain of left knee 10/25/2016   Primary osteoarthritis of both knees 10/18/2016   Urinary incontinence 10/08/2016   Chronic pain of both knees 10/08/2016   Generalized osteoarthritis of multiple sites 10/08/2016   Vegan diet 08/27/2016   Thyroid goiter 08/27/2016   h/o Near syncope 08/20/2013   h/o chronic Dizziness 08/20/2013   Pain in joint, shoulder region 09/26/2012   Biceps tendinopathy 09/26/2012   Right wrist pain 09/26/2012   S/P AV nodal ablation 08/05/2011   h/o Paroxysmal SVT (supraventricular tachycardia) ([place]) 10/06/2010   Palpitations 08/26/2010   Obesity 08/26/2010   Past Medical History:  Diagnosis Date   Arthritis    Dysrhythmia 2014   SVT. ablation done. no problems since   Fibromyalgia    Heart murmur 2019   Hepatitis 1967   in college with mononucleosis   Herpes 1984   Hypothyroidism    Multinodular goiter    Osteopenia    S/P AV nodal ablation 08/05/2011   SVT (supraventricular tachycardia) ([facility])    Thyroid disease    thyroid nodule being monitored by Dr. Chalmers Cater    Family History  Problem Relation Age of Onset   Dementia Mother 56       alive   Transient ischemic attack Mother        hx of; has a pacemaker   Hypothyroidism Mother    Other Father 72       old age. Develop CHF the last 4 months   Other Sister 29       died of thymoma   Hypothyroidism Sister    Other Maternal Aunt        nasal tumor   Heart attack Maternal Grandfather    Hypothyroidism Maternal Aunt    Stroke Paternal Grandfather    Colon cancer Neg Hx    Esophageal cancer Neg Hx    Pancreatic cancer Neg Hx    Prostate cancer Neg Hx    Rectal cancer Neg Hx    Stomach cancer Neg Hx     Past Surgical History:  Procedure Laterality Date   BREAST CYST ASPIRATION Bilateral 12/15/2000   CATARACT EXTRACTION W/ INTRAOCULAR LENS IMPLANT Bilateral 2014   paragard iud     8/90   SUPRAVENTRICULAR  TACHYCARDIA ABLATION N/A 08/05/2011   Procedure: SUPRAVENTRICULAR TACHYCARDIA ABLATION;  Surgeon: Evans Lance, MD;  Location[facility] C[facility] LAB;  Service: Cardiovascular;  Laterality: N/A;   TONSILLECTOMY  1950   TOTAL HIP ARTHROPLASTY Left 08/29/2020   Procedure: LEFT TOTAL HIP ARTHROPLASTY ANTERIOR APPROACH;  Surgeon: Mcarthur Rossetti, MD;  Location: [facility] ORS;  Service: Orthopedics;  Laterality: Left;   Social History   Occupational History   Not on file  Tobacco Use   Smoking status: Former  Packs/day: 2.00    Years: 2.00    Pack years: 4.00    Types: Cigarettes    Start date: 03/01/1964    Quit date: 03/01/1970    Years since quitting: 50.6   Smokeless tobacco: Never  Vaping Use   Vaping Use: Never used  Substance and Sexual Activity   Alcohol use: Yes    Alcohol/week: 0.0 standard drinks    Comment: occasional   Drug use: No   Sexual activity: Never    Birth control/protection: Post-menopausal

## 2020-10-27 ENCOUNTER — Telehealth: Payer: Self-pay | Admitting: Physician Assistant

## 2020-10-27 NOTE — Telephone Encounter (Signed)
Patient is aware and verbalized understanding. AS, CMA

## 2020-10-27 NOTE — Telephone Encounter (Signed)
Patient would like to come off of amitriptyline if possible. Patient has talked with Herb Grays about coming off of this. Please advise, thanks.

## 2020-10-31 DIAGNOSIS — Z20822 Contact with and (suspected) exposure to covid-19: Secondary | ICD-10-CM | POA: Diagnosis not present

## 2020-11-14 ENCOUNTER — Ambulatory Visit: Payer: Medicare Other | Admitting: Physician Assistant

## 2020-11-20 ENCOUNTER — Other Ambulatory Visit: Payer: Self-pay

## 2020-11-20 DIAGNOSIS — Z0000 Encounter for general adult medical examination without abnormal findings: Secondary | ICD-10-CM

## 2020-11-21 ENCOUNTER — Other Ambulatory Visit: Payer: Self-pay

## 2020-11-21 ENCOUNTER — Other Ambulatory Visit: Payer: Medicare Other

## 2020-11-21 DIAGNOSIS — Z0000 Encounter for general adult medical examination without abnormal findings: Secondary | ICD-10-CM

## 2020-11-21 DIAGNOSIS — E559 Vitamin D deficiency, unspecified: Secondary | ICD-10-CM | POA: Diagnosis not present

## 2020-11-22 LAB — CBC WITH DIFFERENTIAL/PLATELET
Basophils Absolute: 0 10*3/uL (ref 0.0–0.2)
Basos: 1 %
EOS (ABSOLUTE): 0.2 10*3/uL (ref 0.0–0.4)
Eos: 4 %
Hematocrit: 36.4 % (ref 34.0–46.6)
Hemoglobin: 12.3 g/dL (ref 11.1–15.9)
Immature Grans (Abs): 0 10*3/uL (ref 0.0–0.1)
Immature Granulocytes: 0 %
Lymphocytes Absolute: 1.7 10*3/uL (ref 0.7–3.1)
Lymphs: 33 %
MCH: 30.6 pg (ref 26.6–33.0)
MCHC: 33.8 g/dL (ref 31.5–35.7)
MCV: 91 fL (ref 79–97)
Monocytes Absolute: 0.7 10*3/uL (ref 0.1–0.9)
Monocytes: 13 %
Neutrophils Absolute: 2.5 10*3/uL (ref 1.4–7.0)
Neutrophils: 49 %
Platelets: 267 10*3/uL (ref 150–450)
RBC: 4.02 x10E6/uL (ref 3.77–5.28)
RDW: 12.6 % (ref 11.7–15.4)
WBC: 5.1 10*3/uL (ref 3.4–10.8)

## 2020-11-22 LAB — COMPREHENSIVE METABOLIC PANEL
ALT: 19 IU/L (ref 0–32)
AST: 22 IU/L (ref 0–40)
Albumin/Globulin Ratio: 1.6 (ref 1.2–2.2)
Albumin: 3.9 g/dL (ref 3.7–4.7)
Alkaline Phosphatase: 99 IU/L (ref 44–121)
BUN/Creatinine Ratio: 21 (ref 12–28)
BUN: 15 mg/dL (ref 8–27)
Bilirubin Total: 0.6 mg/dL (ref 0.0–1.2)
CO2: 23 mmol/L (ref 20–29)
Calcium: 9.2 mg/dL (ref 8.7–10.3)
Chloride: 102 mmol/L (ref 96–106)
Creatinine, Ser: 0.72 mg/dL (ref 0.57–1.00)
Globulin, Total: 2.5 g/dL (ref 1.5–4.5)
Glucose: 83 mg/dL (ref 65–99)
Potassium: 3.9 mmol/L (ref 3.5–5.2)
Sodium: 140 mmol/L (ref 134–144)
Total Protein: 6.4 g/dL (ref 6.0–8.5)
eGFR: 88 mL/min/{1.73_m2} (ref >59)

## 2020-11-22 LAB — LIPID PANEL
Chol/HDL Ratio: 2.8 ratio (ref 0.0–4.4)
Cholesterol, Total: 214 mg/dL — ABNORMAL HIGH (ref 100–199)
HDL: 77 mg/dL (ref >39)
LDL Chol Calc (NIH): 125 mg/dL — ABNORMAL HIGH (ref 0–99)
Triglycerides: 70 mg/dL (ref 0–149)
VLDL Cholesterol Cal: 12 mg/dL (ref 5–40)

## 2020-11-22 LAB — HEMOGLOBIN A1C
Est. average glucose Bld gHb Est-mCnc: 120 mg/dL
Hgb A1c MFr Bld: 5.8 % — ABNORMAL HIGH (ref 4.8–5.6)

## 2020-11-22 LAB — TSH: TSH: 1.44 u[IU]/mL (ref 0.450–4.500)

## 2020-11-24 ENCOUNTER — Encounter: Payer: Self-pay | Admitting: Urology

## 2020-11-24 ENCOUNTER — Encounter: Payer: Self-pay | Admitting: Physician Assistant

## 2020-11-24 ENCOUNTER — Ambulatory Visit (INDEPENDENT_AMBULATORY_CARE_PROVIDER_SITE_OTHER): Payer: Medicare Other | Admitting: Physician Assistant

## 2020-11-24 ENCOUNTER — Other Ambulatory Visit: Payer: Self-pay

## 2020-11-24 ENCOUNTER — Ambulatory Visit: Payer: Medicare Other | Admitting: Urology

## 2020-11-24 VITALS — BP 148/80 | HR 78 | Ht 63.0 in | Wt 158.0 lb

## 2020-11-24 VITALS — BP 121/67 | HR 74 | Temp 98.3°F | Ht 63.0 in | Wt 163.4 lb

## 2020-11-24 DIAGNOSIS — E559 Vitamin D deficiency, unspecified: Secondary | ICD-10-CM | POA: Diagnosis not present

## 2020-11-24 DIAGNOSIS — N3946 Mixed incontinence: Secondary | ICD-10-CM | POA: Diagnosis not present

## 2020-11-24 DIAGNOSIS — Z23 Encounter for immunization: Secondary | ICD-10-CM | POA: Diagnosis not present

## 2020-11-24 DIAGNOSIS — Z0000 Encounter for general adult medical examination without abnormal findings: Secondary | ICD-10-CM | POA: Diagnosis not present

## 2020-11-24 DIAGNOSIS — R7303 Prediabetes: Secondary | ICD-10-CM

## 2020-11-24 DIAGNOSIS — E7800 Pure hypercholesterolemia, unspecified: Secondary | ICD-10-CM | POA: Diagnosis not present

## 2020-11-24 MED ORDER — TOLTERODINE TARTRATE ER 4 MG PO CP24: 4.0000 mg | ORAL_CAPSULE | Freq: Every day | ORAL | 11 refills | Status: DC

## 2020-11-24 NOTE — Progress Notes (Signed)
Subjective:   Lindsay [name] is a 75 y.o. female who presents for Medicare Annual (Subsequent) preventive examination.  Review of Systems    General:   No F/C, wt loss Pulm:   No DIB, SOB, pleuritic chest pain Card:  No CP, palpitations Abd:  No n/v/d or pain Ext:  No inc edema from baseline  Objective:    Today's Vitals   11/24/20 1520  BP: 121/67  Pulse: 74  Temp: 98.3 F (36.8 C)  SpO2: 99%  Weight: 163 lb 6.4 oz (74.1 kg)  Height: 5\' 3"  (1.6 m)   Body mass index is 28.95 kg/m.  Advanced Directives 08/29/2020 08/25/2020 05/04/2019 12/17/2016 08/27/2016 08/05/2011 08/05/2011  Does Patient Have a Medical Advance Directive? No No No No No Patient does not have advance directive Patient does not have advance directive  Would patient like information on creating a medical advance directive? No - Patient declined No - Patient declined No - Patient declined - No - Patient declined - -  Pre-existing out of facility DNR order (yellow form or pink MOST form) - - - - - No -    Current Medications (verified) Outpatient Encounter Medications as of 11/24/2020  Medication Sig   Ascorbic Acid (VITAMIN C) 1000 MG tablet Take 1,000 mg by mouth at bedtime.   ELDERBERRY PO Take 5 mLs by mouth at bedtime.   folic acid (FOLVITE) 497 MCG tablet Take 800 mcg by mouth daily.   GINSENG PO Take 25 drops by mouth daily.   ibuprofen (ADVIL) 800 MG tablet Take 1 tablet (800 mg total) by mouth every 8 (eight) hours as needed. (Patient taking differently: Take 800 mg by mouth every 8 (eight) hours as needed for mild pain.)   levothyroxine (SYNTHROID, LEVOTHROID) 25 MCG tablet Take 25 mcg by mouth daily.   Multiple Vitamin (MULTIVITAMIN) tablet Take 1 tablet by mouth daily.   Multiple Vitamins-Minerals (ZINC PO) Take 1 tablet by mouth daily.   Nutritional Supplements (WELLNESS ESSENTIALS PO) Take 2 capsules by mouth daily.   Probiotic Product (PROBIOTIC FORMULA PO) Take 1 capsule by mouth daily. New chapter  All-Flora.   Propylene Glycol (SYSTANE COMPLETE OP) Place 1 drop into both eyes daily.   sodium fluoride (FLUORISHIELD) 1.1 % GEL dental gel Place 1 application onto teeth at bedtime.   tolterodine (DETROL LA) 4 MG 24 hr capsule Take 1 capsule (4 mg total) by mouth daily.   TURMERIC PO Take 1 tablet by mouth daily.   Vitamin D, Ergocalciferol, (DRISDOL) 1.25 MG (50000 UNIT) CAPS capsule Take one tablet wkly (Patient taking differently: Take 50,000 Units by mouth every 7 (seven) days.)   [DISCONTINUED] amitriptyline (ELAVIL) 10 MG tablet Take 1 tablet (10 mg total) by mouth at bedtime.   [DISCONTINUED] tolterodine (DETROL LA) 4 MG 24 hr capsule Take 1 capsule (4 mg total) by mouth daily.   Facility-Administered Encounter Medications as of 11/24/2020  Medication   bupivacaine (MARCAINE) 0.5 % injection 2 mL   lidocaine (XYLOCAINE) 2 % (with pres) injection 20 mg   methylPREDNISolone acetate (DEPO-MEDROL) injection 40 mg    Allergies (verified) Nick[place]   History: Past Medical History:  Diagnosis Date   Arthritis    Dysrhythmia 2014   SVT. ablation done. no problems since   Fibromyalgia    Heart murmur 2019   Hepatitis 1967   in college with mononucleosis   Herpes 1984   Hypothyroidism    Multinodular goiter    Osteopenia    S/P  AV nodal ablation 08/05/2011   SVT (supraventricular tachycardia) ([facility])    Thyroid disease    thyroid nodule being monitored by Dr. Chalmers Cater   Past Surgical History:  Procedure Laterality Date   BREAST CYST ASPIRATION Bilateral 12/15/2000   CATARACT EXTRACTION W/ INTRAOCULAR LENS IMPLANT Bilateral 2014   paragard iud     8/90   SUPRAVENTRICULAR TACHYCARDIA ABLATION N/A 08/05/2011   Procedure: SUPRAVENTRICULAR TACHYCARDIA ABLATION;  Surgeon: Evans Lance, MD;  Location: [facility] CATH LAB;  Service: Cardiovascular;  Laterality: N/A;   TONSILLECTOMY  1950   TOTAL HIP ARTHROPLASTY Left 08/29/2020   Procedure: LEFT TOTAL HIP ARTHROPLASTY ANTERIOR APPROACH;   Surgeon: Mcarthur Rossetti, MD;  Location: [facility] ORS;  Service: Orthopedics;  Laterality: Left;   Family History  Problem Relation Age of Onset   Dementia Mother 24       alive   Transient ischemic attack Mother        hx of; has a pacemaker   Hypothyroidism Mother    Other Father 11       old age. Develop CHF the last 4 months   Other Sister 68       died of thymoma   Hypothyroidism Sister    Other Maternal Aunt        nasal tumor   Heart attack Maternal Grandfather    Hypothyroidism Maternal Aunt    Stroke Paternal Grandfather    Colon cancer Neg Hx    Esophageal cancer Neg Hx    Pancreatic cancer Neg Hx    Prostate cancer Neg Hx    Rectal cancer Neg Hx    Stomach cancer Neg Hx    Social History   Socioeconomic History   Marital status: Divorced    Spouse name: Not on file   Number of children: 2   Years of education: Not on file   Highest education level: Not on file  Occupational History   Not on file  Tobacco Use   Smoking status: Former    Packs/day: 2.00    Years: 2.00    Pack years: 4.00    Types: Cigarettes    Start date: 03/01/1964    Quit date: 03/01/1970    Years since quitting: 50.7   Smokeless tobacco: Never  Vaping Use   Vaping Use: Never used  Substance and Sexual Activity   Alcohol use: Yes    Alcohol/week: 0.0 standard drinks    Comment: occasional   Drug use: No   Sexual activity: Never    Birth control/protection: Post-menopausal  Other Topics Concern   Not on file  Social History Narrative   Not on file   Social Determinants of Health   Financial Resource Strain: Not on file  Food Insecurity: Not on file  Transportation Needs: Not on file  Physical Activity: Not on file  Stress: Not on file  Social Connections: Not on file    Tobacco Counseling Counseling given: Not Answered    Diabetic?no    Activities of Daily Living In your present state of health, do you have any difficulty performing the following activities:  11/24/2020 08/29/2020  Hearing? N N  Vision? Y N  Difficulty concentrating or making decisions? N N  Walking or climbing stairs? Y Y  Dressing or bathing? Y N  Doing errands, shopping? N N  Some recent data might be hidden    Patient Care Team: Lorrene Reid, PA-C as PCP - General Adrian Prows, MD as Consulting Physician (Cardiology) Cristopher Peru  Viona Gilmore, MD as Consulting Physician (Cardiology) Juanita Craver, MD as Consulting Physician (Gastroenterology) Jacelyn Pi, MD as Consulting Physician (Endocrinology) Macarthur Critchley, OD as Referring Physician (Optometry) Darleen Crocker, MD as Consulting Physician (Ophthalmology) Linton Rump, PT as Physical Therapist (Physical Therapy) Megan Salon, MD as Consulting Physician (Gynecology)  Indicate any recent Medical Services you may have received from other than [facility] providers in the past year (date may be approximate).     Assessment:   This is a routine wellness examination for Iana.  Hearing/Vision screen No results found.  Dietary issues and exercise activities discussed: -Patient follows a vegetarian diet low in carbohydrates and fat. Plans to resume walking regimen as her left hip continues to heal.    Goals Addressed   None   Depression Screen PHQ 2/9 Scores 11/24/2020 08/11/2020 06/04/2020 11/26/2019 09/24/2019 12/07/2018 11/30/2018  PHQ - 2 Score 1 0 0 0 0 0 0  PHQ- 9 Score 2 0 - 2 1 1 1     Fall Risk Fall Risk  11/24/2020 08/11/2020 06/04/2020 05/26/2020 11/26/2019  Falls in the past year? 0 1 1 1 1   Number falls in past yr: 0 0 - - 0  Injury with Fall? 0 1 - - 1  Risk for fall due to : No Fall Risks History of fall(s) - - -  Follow up Falls evaluation completed Falls evaluation completed Falls evaluation completed Falls evaluation completed Falls evaluation completed    [place]:  Any stairs in or around the home? Yes  If so, are there any without handrails? No  Home free of loose throw rugs in  walkways, pet beds, electrical cords, etc? No  Adequate lighting in your home to reduce risk of falls? Yes   ASSISTIVE DEVICES UTILIZED TO PREVENT FALLS:  Life alert? No  Use of a cane, walker or w/c? No  Grab bars in the bathroom? No  Shower chair or bench in shower? No  Elevated toilet seat or a handicapped toilet? Yes   TIMED UP AND GO:  Was the test performed? Yes .  Length of time to ambulate 10 feet: 12 sec.   Gait slow and steady without use of assistive device  Cognitive Function: stable     6CIT Screen 11/24/2020 11/26/2019 11/20/2018 08/29/2017  What Year? 0 points 0 points 0 points 0 points  What month? 0 points 0 points 0 points 0 points  What time? 0 points 0 points 0 points 0 points  Count back from 20 2 points 2 points 0 points 0 points  Months in reverse 0 points 0 points 0 points 0 points  Repeat phrase 0 points 0 points 0 points 0 points  Total Score 2 2 0 0    Immunizations Immunization History  Administered Date(s) Administered   DTaP 03/02/2015   Fluad Quad(high Dose 65+) 11/24/2020   Influenza, Quadrivalent, Recombinant, Inj, Pf 12/10/2016, 12/09/2017, 12/15/2018, 01/04/2020   Moderna Sars-Covid-2 Vaccination 03/23/2019, 04/20/2019   PFIZER(Purple Top)SARS-COV-2 Vaccination 12/23/2019, 08/16/2020   Pneumococcal Conjugate-13 06/14/2015   Pneumococcal Polysaccharide-23 06/22/2013   Tdap 06/28/2007    TDAP status: Up to date  Flu Vaccine status: Completed at today's visit  Pneumococcal vaccine status: Up to date  Covid-19 vaccine status: Completed vaccines  Qualifies for Shingles Vaccine? Yes   Zostavax completed Yes   Shingrix Completed?: No.    Education has been provided regarding the importance of this vaccine. Patient has been advised to call insurance company to determine out  of pocket expense if they have not yet received this vaccine. Advised may also receive vaccine at local pharmacy or Health Dept. Verbalized acceptance and  understanding.  Screening Tests Health Maintenance  Topic Date Due   Zoster Vaccines- Shingrix (1 of 2) Never done   Hepatitis C Screening  08/26/2028 (Originally 07/07/1963)   COVID-19 Vaccine (5 - Booster) 12/16/2020   MAMMOGRAM  05/17/2022   TETANUS/TDAP  04/01/2025   COLONOSCOPY (Pts 45-4yrs Insurance coverage will need to be confirmed)  12/28/2026   INFLUENZA VACCINE  Completed   DEXA SCAN  Completed   HPV VACCINES  Aged Out    Health Maintenance  Health Maintenance Due  Topic Date Due   Zoster Vaccines- Shingrix (1 of 2) Never done    Colorectal cancer screening: Type of screening: Colonoscopy. Completed 12/27/16. Repeat every 10 years. Repeat is not needed due to age.   Mammogram status: Completed 05/19/2020. Repeat every year  Bone Density status: Completed 06/08/2019. Results reflect: Bone density results: OSTEOPENIA. Repeat every 2 years.  Lung Cancer Screening: (Low Dose CT Chest recommended if Age 79-80 years, 30 pack-year currently smoking OR have quit w/in 15years.) does not qualify.   Lung Cancer Screening Referral: n/a  Additional Screening:  Hepatitis C Screening: does not qualify; Completed pt declined  Vision Screening: Recommended annual ophthalmology exams for early detection of glaucoma and other disorders of the eye. Is the patient up to date with their annual eye exam?  Yes  Who is the provider or what is the name of the office in which the patient attends annual eye exams? Dr. Macarthur Critchley  If pt is not established with a provider, would they like to be referred to a provider to establish care? No .   Dental Screening: Recommended annual dental exams for proper oral hygiene  Community Resource Referral / Chronic Care Management: CRR required this visit?  No   CCM required this visit?  No      Plan:  -Discussed most recent labs which are essentially within normal limits or stable from prior. Patient managing HLD with diet and lifestyle.   -Recovering well from left hip replacement. Recommend to increase physical activity as tolerated. -Continue to follow up with various specialists.   -Will contact [place] and add Vitamin D lab. Pending results will send additional refills of Vitamin D or adjust treatment plan. -Follow up in 1 year for [facility] and [facility] or sooner if needed    I have personally reviewed and noted the following in the patient's chart:   Medical and social history Use of alcohol, tobacco or illicit drugs  Current medications and supplements including opioid prescriptions.  Functional ability and status Nutritional status Physical activity Advanced directives List of other physicians Hospitalizations, surgeries, and ER visits in previous 12 months Vitals Screenings to include cognitive, depression, and falls Referrals and appointments  In addition, I have reviewed and discussed with patient certain preventive protocols, quality metrics, and best practice recommendations. A written personalized care plan for preventive services as well as general preventive health recommendations were provided to patient.     Lorrene Reid, PA-C   11/24/2020

## 2020-11-24 NOTE — Patient Instructions (Signed)
Preventive Care 40 Years and Older, Female Preventive care refers to lifestyle choices and visits with your health care provider that can promote health and wellness. This includes: A yearly physical exam. This is also called an annual wellness visit. Regular dental and eye exams. Immunizations. Screening for certain conditions. Healthy lifestyle choices, such as: Eating a healthy diet. Getting regular exercise. Not using drugs or products that contain nicotine and tobacco. Limiting alcohol use. What can I expect for my preventive care visit? Physical exam Your health care provider will check your: Height and weight. These may be used to calculate your BMI (body mass index). BMI is a measurement that tells if you are at a healthy weight. Heart rate and blood pressure. Body temperature. Skin for abnormal spots. Counseling Your health care provider may ask you questions about your: Past medical problems. Family's medical history. Alcohol, tobacco, and drug use. Emotional well-being. Home life and relationship well-being. Sexual activity. Diet, exercise, and sleep habits. History of falls. Memory and ability to understand (cognition). Work and work Statistician. Pregnancy and menstrual history. Access to firearms. What immunizations do I need? Vaccines are usually given at various ages, according to a schedule. Your health care provider will recommend vaccines for you based on your age, medical history, and lifestyle or other factors, such as travel or where you work. What tests do I need? Blood tests Lipid and cholesterol levels. These may be checked every 5 years, or more often depending on your overall health. Hepatitis C test. Hepatitis B test. Screening Lung cancer screening. You may have this screening every year starting at age 33 if you have a 30-pack-year history of smoking and currently smoke or have quit within the past 15 years. Colorectal cancer screening. All  adults should have this screening starting at age 73 and continuing until age 9. Your health care provider may recommend screening at age 31 if you are at increased risk. You will have tests every 1-10 years, depending on your results and the type of screening test. Diabetes screening. This is done by checking your blood sugar (glucose) after you have not eaten for a while (fasting). You may have this done every 1-3 years. Mammogram. This may be done every 1-2 years. Talk with your health care provider about how often you should have regular mammograms. Abdominal aortic aneurysm (AAA) screening. You may need this if you are a current or former smoker. BRCA-related cancer screening. This may be done if you have a family history of breast, ovarian, tubal, or peritoneal cancers. Other tests STD (sexually transmitted disease) testing, if you are at risk. Bone density scan. This is done to screen for osteoporosis. You may have this done starting at age 43. Talk with your health care provider about your test results, treatment options, and if necessary, the need for more tests. Follow these instructions at home: Eating and drinking  Eat a diet that includes fresh fruits and vegetables, whole grains, lean protein, and low-fat dairy products. Limit your intake of foods with high amounts of sugar, saturated fats, and salt. Take vitamin and mineral supplements as recommended by your health care provider. Do not drink alcohol if your health care provider tells you not to drink. If you drink alcohol: Limit how much you have to 0-1 drink a day. Be aware of how much alcohol is in your drink. In the [place] one drink equals one 12 oz bottle of beer (355 mL), one 5 oz glass of wine (148 mL), or one  1 oz glass of hard liquor (44 mL). Lifestyle Take daily care of your teeth and gums. Brush your teeth every morning and night with fluoride toothpaste. Floss one time each day. Stay active. Exercise for at least  30 minutes 5 or more days each week. Do not use any products that contain nicotine or tobacco, such as cigarettes, e-cigarettes, and chewing tobacco. If you need help quitting, ask your health care provider. Do not use drugs. If you are sexually active, practice safe sex. Use a condom or other form of protection in order to prevent STIs (sexually transmitted infections). Talk with your health care provider about taking a low-dose aspirin or statin. Find healthy ways to cope with stress, such as: Meditation, yoga, or listening to music. Journaling. Talking to a trusted person. Spending time with friends and family. Safety Always wear your seat belt while driving or riding in a vehicle. Do not drive: If you have been drinking alcohol. Do not ride with someone who has been drinking. When you are tired or distracted. While texting. Wear a helmet and other protective equipment during sports activities. If you have firearms in your house, make sure you follow all gun safety procedures. What's next? Visit your health care provider once a year for an annual wellness visit. Ask your health care provider how often you should have your eyes and teeth checked. Stay up to date on all vaccines. This information is not intended to replace advice given to you by your health care provider. Make sure you discuss any questions you have with your health care provider. Document Revised: 04/25/2020 Document Reviewed: 02/09/2018 Elsevier Patient Education  2022 [employer].

## 2020-11-24 NOTE — Progress Notes (Signed)
11/24/2020 11:42 AM   Lindsay [name] 1945-09-11 [id number]  Referring provider: Lorrene Reid, PA-C [place],  [place] [address]  Chief Complaint  Patient presents with   Urinary Incontinence    HPI: Patient has urgency incontinence worsening over many months.  She wears a pad for confidence and leaks that she is out in public.  She is bathroom mapping and again does not always leak.  No stress incontinence or bedwetting.  Voids every 30 to 60 minutes and cannot hold it for 2 hours.  Gets up 3-4 times a night   Narrow introitus.  Small grade 1 cystocele larger near the trigone but did not descend.  It was asymptomatic.  Mild hypermobility the bladder neck and negative cough test   Patient has mild overactive bladder and urgency incontinence.  Medical behavioral therapy and physical therapy discussed if we reach her treatment goal I likely would try to stop the medication in the future   Failed Myrbetriq and physical therapy but was not that compliant     Oxybutynin has helped minimally. She never filled the Vesicare because it might be expensive. Clinically not infected. Still has uncommon urge incontinence    I sent in Detrol LA 30 tabs 11 refills and hand-delivered Vesicare prescription.   Today Frequency stable.  Still has an urge and bladder.  Good days and bad days.  She has not reached her treatment goal but stayed on the Detrol did not try the other medicines.  She would like more benefit.  Clinically not infected.  Went over percutaneous tibial nerve stimulation and Botox in full detail.  I mention InterStim but I thought it was too invasive for severity of symptoms.  She does not leak every day and she agreed   PMH: Past Medical History:  Diagnosis Date   Arthritis    Dysrhythmia 2014   SVT. ablation done. no problems since   Fibromyalgia    Heart murmur 2019   Hepatitis 1967   in college with mononucleosis   Herpes 1984    Hypothyroidism    Multinodular goiter    Osteopenia    S/P AV nodal ablation 08/05/2011   SVT (supraventricular tachycardia) (HCC)    Thyroid disease    thyroid nodule being monitored by Dr. Chalmers Cater    Surgical History: Past Surgical History:  Procedure Laterality Date   BREAST CYST ASPIRATION Bilateral 12/15/2000   CATARACT EXTRACTION W/ INTRAOCULAR LENS IMPLANT Bilateral 2014   paragard iud     8/90   SUPRAVENTRICULAR TACHYCARDIA ABLATION N/A 08/05/2011   Procedure: SUPRAVENTRICULAR TACHYCARDIA ABLATION;  Surgeon: Evans Lance, MD;  Location: [facility] CATH LAB;  Service: Cardiovascular;  Laterality: N/A;   TONSILLECTOMY  1950   TOTAL HIP ARTHROPLASTY Left 08/29/2020   Procedure: LEFT TOTAL HIP ARTHROPLASTY ANTERIOR APPROACH;  Surgeon: Mcarthur Rossetti, MD;  Location: [facility];  Service: Orthopedics;  Laterality: Left;    Home Medications:  Allergies as of 11/24/2020       Reactions   Nickel Rash        Medication List        Accurate as of November 24, 2020 11:42 AM. If you have any questions, ask your nurse or doctor.          STOP taking these medications    aspirin EC 325 MG tablet Stopped by: Reece Packer, MD   methocarbamol 500 MG tablet Commonly known as: Robaxin Stopped by: Reece Packer, MD  ondansetron 4 MG tablet Commonly known as: ZOFRAN Stopped by: Reece Packer, MD   oxyCODONE-acetaminophen 5-325 MG tablet Commonly known as: PERCOCET/ROXICET Stopped by: Reece Packer, MD       TAKE these medications    amitriptyline 10 MG tablet Commonly known as: ELAVIL Take 1 tablet (10 mg total) by mouth at bedtime.   ELDERBERRY PO Take 5 mLs by mouth at bedtime.   folic acid 856 MCG tablet Commonly known as: FOLVITE Take 800 mcg by mouth daily.   GINSENG PO Take 25 drops by mouth daily.   ibuprofen 800 MG tablet Commonly known as: ADVIL Take 1 tablet (800 mg total) by mouth every 8 (eight) hours as needed. What  changed: reasons to take this   levothyroxine 25 MCG tablet Commonly known as: SYNTHROID Take 25 mcg by mouth daily.   multivitamin tablet Take 1 tablet by mouth daily.   PROBIOTIC FORMULA PO Take 1 capsule by mouth daily. New chapter All-Flora.   sodium fluoride 1.1 % Gel dental gel Commonly known as: FLUORISHIELD Place 1 application onto teeth at bedtime.   SYSTANE COMPLETE OP Place 1 drop into both eyes daily.   tolterodine 4 MG 24 hr capsule Commonly known as: Detrol LA Take 1 capsule (4 mg total) by mouth daily.   TURMERIC PO Take 1 tablet by mouth daily.   vitamin C 1000 MG tablet Take 1,000 mg by mouth at bedtime.   Vitamin D (Ergocalciferol) 1.25 MG (50000 UNIT) Caps capsule Commonly known as: DRISDOL Take one tablet wkly What changed:  how much to take how to take this when to take this additional instructions   WELLNESS ESSENTIALS PO Take 2 capsules by mouth daily.   ZINC PO Take 1 tablet by mouth daily.        Allergies:  Allergies  Allergen Reactions   Nickel Rash    Family History: Family History  Problem Relation Age of Onset   Dementia Mother 79       alive   Transient ischemic attack Mother        hx of; has a pacemaker   Hypothyroidism Mother    Other Father 62       old age. Develop CHF the last 4 months   Other Sister 56       died of thymoma   Hypothyroidism Sister    Other Maternal Aunt        nasal tumor   Heart attack Maternal Grandfather    Hypothyroidism Maternal Aunt    Stroke Paternal Grandfather    Colon cancer Neg Hx    Esophageal cancer Neg Hx    Pancreatic cancer Neg Hx    Prostate cancer Neg Hx    Rectal cancer Neg Hx    Stomach cancer Neg Hx     Social History:  reports that she quit smoking about 50 years ago. Her smoking use included cigarettes. She started smoking about 56 years ago. She has a 4.00 pack-year smoking history. She has never used smokeless tobacco. She reports current alcohol use. She  reports that she does not use drugs.  ROS:                                        Physical Exam: BP (!) 148/80   Pulse 78   Ht 5\' 3"  (1.6 m)   Wt 71.7 kg  BMI 27.99 kg/m   Constitutional:  Alert and oriented, No acute distress. HEENT: [place] AT, moist mucus membranes.  Trachea midline, no masses.   Laboratory Data: Lab Results  Component Value Date   WBC 5.1 11/21/2020   HGB 12.3 11/21/2020   HCT 36.4 11/21/2020   MCV 91 11/21/2020   PLT 267 11/21/2020    Lab Results  Component Value Date   CREATININE 0.72 11/21/2020    No results found for: PSA  No results found for: TESTOSTERONE  Lab Results  Component Value Date   HGBA1C 5.8 (H) 11/21/2020    Urinalysis    Component Value Date/Time   COLORURINE YELLOW 08/20/2013 1030   APPEARANCEUR Clear 04/02/2019 1336   LABSPEC 1.020 08/20/2013 1030   PHURINE 6.5 08/20/2013 1030   GLUCOSEU Negative 04/02/2019 1336   HGBUR NEGATIVE 08/20/2013 1030   BILIRUBINUR Negative 04/02/2019 1336   KETONESUR 15 (A) 08/20/2013 1030   PROTEINUR Negative 04/02/2019 1336   PROTEINUR NEGATIVE 08/20/2013 1030   UROBILINOGEN 0.2 08/20/2013 1030   NITRITE Negative 04/02/2019 1336   NITRITE NEGATIVE 08/20/2013 1030   LEUKOCYTESUR Negative 04/02/2019 1336    Pertinent Imaging:   Assessment & Plan: Renew Detrol 3x11.  2 handouts given.  She will call and proceed with one of the 2 treatments lately.  There are no diagnoses linked to this encounter.  No follow-ups on file.  Reece Packer, MD  [facility] [address], [place], [place] [address] [phone]

## 2020-11-27 ENCOUNTER — Other Ambulatory Visit: Payer: Self-pay | Admitting: Physician Assistant

## 2020-11-27 DIAGNOSIS — E559 Vitamin D deficiency, unspecified: Secondary | ICD-10-CM

## 2020-12-01 ENCOUNTER — Other Ambulatory Visit: Payer: Self-pay | Admitting: Physician Assistant

## 2020-12-01 DIAGNOSIS — E559 Vitamin D deficiency, unspecified: Secondary | ICD-10-CM

## 2020-12-01 LAB — VITAMIN D 25 HYDROXY (VIT D DEFICIENCY, FRACTURES): Vit D, 25-Hydroxy: 49.4 ng/mL (ref 30.0–100.0)

## 2020-12-01 LAB — SPECIMEN STATUS REPORT

## 2020-12-01 MED ORDER — VITAMIN D (ERGOCALCIFEROL) 1.25 MG (50000 UNIT) PO CAPS: ORAL_CAPSULE | ORAL | 1 refills | Status: DC

## 2020-12-08 DIAGNOSIS — H5213 Myopia, bilateral: Secondary | ICD-10-CM | POA: Diagnosis not present

## 2021-01-02 DIAGNOSIS — E049 Nontoxic goiter, unspecified: Secondary | ICD-10-CM | POA: Diagnosis not present

## 2021-01-07 DIAGNOSIS — E049 Nontoxic goiter, unspecified: Secondary | ICD-10-CM | POA: Diagnosis not present

## 2021-03-19 DIAGNOSIS — N3281 Overactive bladder: Secondary | ICD-10-CM | POA: Diagnosis not present

## 2021-03-19 DIAGNOSIS — N3941 Urge incontinence: Secondary | ICD-10-CM | POA: Diagnosis not present

## 2021-03-23 ENCOUNTER — Encounter: Payer: Self-pay | Admitting: Orthopaedic Surgery

## 2021-03-23 ENCOUNTER — Other Ambulatory Visit: Payer: Self-pay

## 2021-03-23 ENCOUNTER — Ambulatory Visit (INDEPENDENT_AMBULATORY_CARE_PROVIDER_SITE_OTHER): Payer: Medicare Other | Admitting: Orthopaedic Surgery

## 2021-03-23 ENCOUNTER — Ambulatory Visit: Payer: Self-pay

## 2021-03-23 DIAGNOSIS — M79672 Pain in left foot: Secondary | ICD-10-CM

## 2021-03-23 DIAGNOSIS — M25562 Pain in left knee: Secondary | ICD-10-CM | POA: Diagnosis not present

## 2021-03-23 DIAGNOSIS — G8929 Other chronic pain: Secondary | ICD-10-CM

## 2021-03-23 DIAGNOSIS — M25561 Pain in right knee: Secondary | ICD-10-CM | POA: Diagnosis not present

## 2021-03-23 MED ORDER — LIDOCAINE HCL 1 % IJ SOLN
3.0000 mL | INTRAMUSCULAR | Status: AC | PRN
  Administered 2021-03-23: 3 mL

## 2021-03-23 MED ORDER — METHYLPREDNISOLONE ACETATE 40 MG/ML IJ SUSP
40.0000 mg | INTRAMUSCULAR | Status: AC | PRN
  Administered 2021-03-23: 40 mg via INTRA_ARTICULAR

## 2021-03-23 NOTE — Progress Notes (Signed)
Office Visit Note   Patient: Lindsay [name]           Date of Birth: February 14, 1946           MRN: [id number] Visit Date: 03/23/2021              Requested by: Lorrene Reid, PA-C [place],  [place] [id number] PCP: Lorrene Reid, PA-C   Assessment & Plan: Visit Diagnoses:  1. Left foot pain   2. Chronic pain of right knee   3. Chronic pain of left knee     Plan: I would like to see her back in 3 months.  At that visit we will have a standing AP pelvis and lateral of her left operative hip.  We will also have a standing AP and lateral of both knees.  I have recommended she see a foot specialist given her severe flatfoot deformity and continued pain with her left foot.  Follow-Up Instructions: Return in about 3 months (around 06/21/2021).   Orders:  Orders Placed This Encounter  Procedures   XR Foot Complete Left   No orders of the defined types were placed in this encounter.     Procedures: Large Joint Inj: R knee on 03/23/2021 2:37 PM Indications: diagnostic evaluation and pain Details: 22 G 1.5 in needle, superolateral approach  Arthrogram: No  Medications: 3 mL lidocaine 1 %; 40 mg methylPREDNISolone acetate 40 MG/ML Outcome: tolerated well, no immediate complications Procedure, treatment alternatives, risks and benefits explained, specific risks discussed. Consent was given by the patient. Immediately prior to procedure a time out was called to verify the correct patient, procedure, equipment, support staff and site/side marked as required. Patient was prepped and draped in the usual sterile fashion.    Large Joint Inj: L knee on 03/23/2021 2:37 PM Indications: diagnostic evaluation and pain Details: 22 G 1.5 in needle, superolateral approach  Arthrogram: No  Medications: 3 mL lidocaine 1 %; 40 mg methylPREDNISolone acetate 40 MG/ML Outcome: tolerated well, no immediate complications Procedure, treatment alternatives, risks and benefits  explained, specific risks discussed. Consent was given by the patient. Immediately prior to procedure a time out was called to verify the correct patient, procedure, equipment, support staff and site/side marked as required. Patient was prepped and draped in the usual sterile fashion.      Clinical Data: No additional findings.   Subjective: Chief Complaint  Patient presents with   Left Foot - Pain  The patient comes in today at 6 months status post a left hip replacement but her hip is doing well other than a little bit of postoperative pain which is to be expected.  She would like to have steroid injections in both knees today.  We have done this in the past.  She also has been having some left foot pain at the midfoot.  She has a history of a left fifth metatarsal fracture.  She also has significant flatfoot deformity bilaterally.  She denies any injury that she is aware of.  She has been wearing a postoperative shoe to help offload her left foot.  She is not a diabetic  HPI  Review of Systems She currently denies any headache, chest pain, shortness of breath, fever, chills, nausea, vomiting  Objective: Vital Signs: There were no vitals taken for this visit.  Physical Exam She is alert and orient x3 and in no acute distress Ortho Exam Examination of both knees shows varus malalignment and pain throughout arc  of motion of her knees.  Her left hip moves smoothly and fluidly for the operative hip.  Her left foot does have a significant flatfoot deformity with midfoot pain and minimal swelling but no redness. Specialty Comments:  No specialty comments available.  Imaging: XR Foot Complete Left  Result Date: 03/23/2021 3 views of her left foot shows significant flatfoot deformity.  I do feel that she is losing the arch of her foot in the midfoot with no significant arthritic findings.  She has healed her fifth metatarsal fracture at the base.    PMFS History: Patient Active Problem  List   Diagnosis Date Noted   Status post left hip replacement 08/29/2020   Unilateral primary osteoarthritis, left hip 08/28/2020   High serum low-density lipoprotein (LDL) 11/30/2018   Vitamin D insufficiency 11/30/2018   High serum high density lipoprotein (HDL) 11/30/2018   newer onset Exercise intolerance * 4-6 mo 08/15/2017   Complaints of transient weakness of bilateral lower extremity 08/15/2017   Transient episodes of numbness of both lower extremities 08/15/2017   Atypical nevus of back 08/07/2017   Reflux esophagitis 08/02/2017   Persistent dry cough 08/02/2017   Muscle spasm of lower bilateral back 08/02/2017   Muscle spasms of neck 08/02/2017   Hoarseness or changing voice 08/02/2017   Left knee pain 06/30/2017   Osteoarthritis of knee 06/20/2017   Cough in adult 03/07/2017   Acute maxillary sinusitis 03/07/2017   Chronic pain of left knee 10/25/2016   Primary osteoarthritis of both knees 10/18/2016   Urinary incontinence 10/08/2016   Chronic pain of both knees 10/08/2016   Generalized osteoarthritis of multiple sites 10/08/2016   Vegan diet 08/27/2016   Thyroid goiter 08/27/2016   h/o Near syncope 08/20/2013   h/o chronic Dizziness 08/20/2013   Pain in joint, shoulder region 09/26/2012   Biceps tendinopathy 09/26/2012   Right wrist pain 09/26/2012   S/P AV nodal ablation 08/05/2011   h/o Paroxysmal SVT (supraventricular tachycardia) ([place]) 10/06/2010   Palpitations 08/26/2010   Obesity 08/26/2010   Past Medical History:  Diagnosis Date   Arthritis    Dysrhythmia 2014   SVT. ablation done. no problems since   Fibromyalgia    Heart murmur 2019   Hepatitis 1967   in college with mononucleosis   Herpes 1984   Hypothyroidism    Multinodular goiter    Osteopenia    S/P AV nodal ablation 08/05/2011   SVT (supraventricular tachycardia) (HCC)    Thyroid disease    thyroid nodule being monitored by Dr. Chalmers Cater    Family History  Problem Relation Age of Onset    Dementia Mother 56       alive   Transient ischemic attack Mother        hx of; has a pacemaker   Hypothyroidism Mother    Other Father 18       old age. Develop CHF the last 4 months   Other Sister 26       died of thymoma   Hypothyroidism Sister    Other Maternal Aunt        nasal tumor   Heart attack Maternal Grandfather    Hypothyroidism Maternal Aunt    Stroke Paternal Grandfather    Colon cancer Neg Hx    Esophageal cancer Neg Hx    Pancreatic cancer Neg Hx    Prostate cancer Neg Hx    Rectal cancer Neg Hx    Stomach cancer Neg Hx     Past  Surgical History:  Procedure Laterality Date   BREAST CYST ASPIRATION Bilateral 12/15/2000   CATARACT EXTRACTION W/ INTRAOCULAR LENS IMPLANT Bilateral 2014   paragard iud     8/90   SUPRAVENTRICULAR TACHYCARDIA ABLATION N/A 08/05/2011   Procedure: SUPRAVENTRICULAR TACHYCARDIA ABLATION;  Surgeon: Evans Lance, MD;  [facility] CATH LAB;  Service: Cardiovascular;  Laterality: N/A;   TONSILLECTOMY  1950   TOTAL HIP ARTHROPLASTY Left 08/29/2020   Procedure: LEFT TOTAL HIP ARTHROPLASTY ANTERIOR APPROACH;  Surgeon: Mcarthur Rossetti, MD;  Location: [facility];  Service: Orthopedics;  Laterality: Left;   Social History   Occupational History   Not on file  Tobacco Use   Smoking status: Former    Packs/day: 2.00    Years: 2.00    Pack years: 4.00    Types: Cigarettes    Start date: 03/01/1964    Quit date: 03/01/1970    Years since quitting: 51.0   Smokeless tobacco: Never  Vaping Use   Vaping Use: Never used  Substance and Sexual Activity   Alcohol use: Yes    Alcohol/week: 0.0 standard drinks    Comment: occasional   Drug use: No   Sexual activity: Never    Birth control/protection: Post-menopausal

## 2021-04-02 DIAGNOSIS — Z006 Encounter for examination for normal comparison and control in clinical research program: Secondary | ICD-10-CM | POA: Insufficient documentation

## 2021-04-02 DIAGNOSIS — N3281 Overactive bladder: Secondary | ICD-10-CM | POA: Insufficient documentation

## 2021-04-19 DIAGNOSIS — Z03818 Encounter for observation for suspected exposure to other biological agents ruled out: Secondary | ICD-10-CM | POA: Diagnosis not present

## 2021-04-19 DIAGNOSIS — Z20822 Contact with and (suspected) exposure to covid-19: Secondary | ICD-10-CM | POA: Diagnosis not present

## 2021-04-21 ENCOUNTER — Encounter: Payer: Self-pay | Admitting: Nurse Practitioner

## 2021-04-21 ENCOUNTER — Ambulatory Visit (INDEPENDENT_AMBULATORY_CARE_PROVIDER_SITE_OTHER): Payer: Medicare Other | Admitting: Nurse Practitioner

## 2021-04-21 ENCOUNTER — Other Ambulatory Visit: Payer: Self-pay

## 2021-04-21 VITALS — Ht 63.0 in | Wt 163.4 lb

## 2021-04-21 DIAGNOSIS — U071 COVID-19: Secondary | ICD-10-CM | POA: Insufficient documentation

## 2021-04-21 DIAGNOSIS — J069 Acute upper respiratory infection, unspecified: Secondary | ICD-10-CM | POA: Insufficient documentation

## 2021-04-21 MED ORDER — NIRMATRELVIR/RITONAVIR (PAXLOVID)TABLET: 3.0000 | ORAL_TABLET | Freq: Two times a day (BID) | ORAL | 0 refills | Status: AC

## 2021-04-21 NOTE — Progress Notes (Signed)
Virtual Visit via Telephone Note  I connected with Lindsay [name] on 04/21/21 at 10:30 AM EST by telephone and verified that I am speaking with the correct person using two identifiers.  Location: Patient: home  Provider: [place] [facility]     I discussed the limitations, risks, security and privacy concerns of performing an evaluation and management service by telephone and the availability of in person appointments. I also discussed with the patient that there may be a patient responsible charge related to this service. The patient expressed understanding and agreed to proceed.   History of Present Illness: The patient states that she started feeling very fatigued after spending a weekend with her children and grandchildren last week. Fatigue started this past Thursday and Friday. She states that on Saturday, she was notified that grandchildren had tested positive for COVID 19. Patient tested Saturday morning. Was negative for COVID. That evening, she started having some chest tightness, cough, and congestion. Monitored her temperature and did not record a fever. States that past two mornings, she did have significant night sweats. She denies nausea, vomiting, or diarrhea. She states that she has been able to eat and drink ok. Symptoms have gradually worsened, she took another test for COVID 19, prior to coming in to the office to be seen. Results were positive.    Observations/Objective:  The patient is alert and oriented. She is pleasant and answers all questions appropriately. Breathing is non-labored. She is in no acute distress at this time.  She sounds nasally congested and has a loose sounding cough.   Today's Vitals   04/21/21 1010  Weight: 163 lb 6.4 oz (74.1 kg)  Height: 5\' 3"  (1.6 m)   Body mass index is 28.95 kg/m.   Assessment and Plan: 1. Upper respiratory tract infection due to COVID-19 virus Start paxlovid twice daily for next five days.  Recommended she take OTC zinc, vitamin d, and vitamin c every day. Can also use otc medications as needed and as indicated for symptom management. She should rest and increase her fluid intake. Reviewed CDC guidelines for home isolation and quarantine. Advised her to contact the office if symptoms do not improve or worsen over the next 48 to 72 hours. She voiced understanding and agreement with all instructions.  - nirmatrelvir/ritonavir EUA (PAXLOVID) 20 x 150 MG & 10 x 100MG  TABS; Take 3 tablets by mouth 2 (two) times daily for 5 days. (Take nirmatrelvir 150 mg two tablets twice daily for 5 days and ritonavir 100 mg one tablet twice daily for 5 days) Patient GFR is  Dispense: 30 tablet; Refill: 0   Follow Up Instructions:    I discussed the assessment and treatment plan with the patient. The patient was provided an opportunity to ask questions and all were answered. The patient agreed with the plan and demonstrated an understanding of the instructions.   The patient was advised to call back or seek an in-person evaluation if the symptoms worsen or if the condition fails to improve as anticipated.  I provided 10 minutes of non-face-to-face time during this encounter.   Ronnell Freshwater, NP

## 2021-04-27 ENCOUNTER — Other Ambulatory Visit: Payer: Self-pay | Admitting: Physician Assistant

## 2021-04-27 DIAGNOSIS — Z1231 Encounter for screening mammogram for malignant neoplasm of breast: Secondary | ICD-10-CM

## 2021-05-08 ENCOUNTER — Other Ambulatory Visit: Payer: Self-pay | Admitting: Physician Assistant

## 2021-05-08 DIAGNOSIS — E559 Vitamin D deficiency, unspecified: Secondary | ICD-10-CM

## 2021-05-18 ENCOUNTER — Other Ambulatory Visit: Payer: Self-pay

## 2021-05-18 ENCOUNTER — Ambulatory Visit
Admission: RE | Admit: 2021-05-18 | Discharge: 2021-05-18 | Disposition: A | Discharge status: Home / Self Care | Payer: Medicare Other | Source: Ambulatory Visit | Attending: Physician Assistant | Admitting: Physician Assistant

## 2021-05-18 DIAGNOSIS — Z1231 Encounter for screening mammogram for malignant neoplasm of breast: Secondary | ICD-10-CM

## 2021-09-11 NOTE — Patient Instructions (Incomplete)
Vitamin D Deficiency Vitamin D deficiency is when your body does not have enough vitamin D. Vitamin D is important because: It helps your body use certain minerals. It helps to keep your bones healthy. It lessens irritation and swelling (inflammation). It helps the body's defense system (immune system) work better. Not getting enough vitamin D can make your bones soft. What are the causes? Not eating enough foods that have vitamin D in them. Not getting enough sun. Having diseases that make it hard for your body to take in vitamin D. Having had part of your stomach or part of your small intestine taken out. What increases the risk? Being an older adult. Not spending much time outdoors. Living in a long-term care center. Having dark skin. Taking certain medicines. Being overweight or very overweight (obese). Having long-term (chronic) kidney or liver disease. What are the signs or symptoms? In mild cases, there may be no symptoms. If the condition is very bad, symptoms may include: Bone pain. Muscle pain. Not being able to walk normally. Bones that break easily. Joint pain. How is this treated? Treatment may include taking supplements as told by your doctor. Your doctor will tell you what dose is best for you. This may include taking: Vitamin D. Calcium. Follow these instructions at home: Eating and drinking Eat foods that have vitamin D in them, such as: Dairy products, cereals, or juices that have vitamin D added to them (are fortified). Check the label. Fish, such as salmon or trout. Eggs. The vitamin D is in the yolk. Mushrooms that were treated with UV light. Beef liver. The items listed above may not be a complete list of foods and beverages you can eat and drink. Contact a dietitian for more information. General instructions Take over-the-counter and prescription medicines only as told by your doctor. Take supplements only as told by your doctor. Get sunlight in a  safe way. Do not use a tanning bed. Stay at a healthy weight. Lose weight if you need to. Keep all follow-up visits. How is this prevented? Eating foods that naturally have vitamin D in them. Eating or drinking foods and drinks that have vitamin D added to them, such as cereals, juices, and milk. Taking vitamin D or a multivitamin that has vitamin D in it. Being in the sun. Your body makes vitamin D when your skin gets sunlight. Contact a doctor if: Your symptoms do not go away. You feel like you may vomit (nauseous). You vomit. You poop less often than normal, or you have trouble pooping (constipation). Summary Vitamin D deficiency is when your body does not have enough vitamin D. Vitamin D helps to keep your bones healthy. This condition is often treated by taking a supplement. Your doctor will tell you what dose is best for you. This information is not intended to replace advice given to you by your health care provider. Make sure you discuss any questions you have with your health care provider. Document Revised: 11/21/2020 Document Reviewed: 11/21/2020 Elsevier Patient Education  2023 Else[employer] Inc.  

## 2021-09-14 ENCOUNTER — Ambulatory Visit (INDEPENDENT_AMBULATORY_CARE_PROVIDER_SITE_OTHER): Payer: Medicare Other | Admitting: Physician Assistant

## 2021-09-14 ENCOUNTER — Encounter: Payer: Self-pay | Admitting: Physician Assistant

## 2021-09-14 DIAGNOSIS — R202 Paresthesia of skin: Secondary | ICD-10-CM

## 2021-09-14 DIAGNOSIS — E559 Vitamin D deficiency, unspecified: Secondary | ICD-10-CM

## 2021-09-14 MED ORDER — VITAMIN D (ERGOCALCIFEROL) 1.25 MG (50000 UNIT) PO CAPS: ORAL_CAPSULE | ORAL | 0 refills | Status: DC

## 2021-09-14 NOTE — Progress Notes (Signed)
Established patient visit   Patient: Lindsay [name]   DOB: 04/11/1945   76 y.o. Female  MRN: [id number] Visit Date: 09/14/2021  Chief Complaint  Patient presents with   Follow-up   Subjective    HPI  Patient presents for medication refill of Vitamin D. Patient also reports started having issues with her foot again. Has intermittent left foot pain and tingling sensation. Feels like weakness of lower extremity is getting worse which has been ongoing chronic issue. Patient states is involved in a bladder study (INTIBIA) for overactive bladder where is not responding like she should so questions if possibly has underlying neuropathy.       Medications: Outpatient Medications Prior to Visit  Medication Sig   Ascorbic Acid (VITAMIN C) 1000 MG tablet Take 1,000 mg by mouth at bedtime.   ELDERBERRY PO Take 5 mLs by mouth at bedtime.   folic acid (FOLVITE) 800 MCG tablet Take 800 mcg by mouth daily.   GINSENG PO Take 25 drops by mouth daily.   levothyroxine (SYNTHROID, LEVOTHROID) 25 MCG tablet Take 25 mcg by mouth daily.   Multiple Vitamin (MULTIVITAMIN) tablet Take 1 tablet by mouth daily.   Multiple Vitamins-Minerals (ZINC PO) Take 1 tablet by mouth daily.   Nutritional Supplements (WELLNESS ESSENTIALS PO) Take 2 capsules by mouth daily.   Probiotic Product (PROBIOTIC FORMULA PO) Take 1 capsule by mouth daily. New chapter All-Flora.   Propylene Glycol (SYSTANE COMPLETE OP) Place 1 drop into both eyes daily.   sodium fluoride (FLUORISHIELD) 1.1 % GEL dental gel Place 1 application onto teeth at bedtime.   TURMERIC PO Take 1 tablet by mouth daily.   [DISCONTINUED] Vitamin D, Ergocalciferol, (DRISDOL) 1.25 MG (50000 UNIT) CAPS capsule TAKE 1 CAPSULE BY MOUTH WEEKLY   [DISCONTINUED] tolterodine (DETROL LA) 4 MG 24 hr capsule Take 1 capsule (4 mg total) by mouth daily. (Patient not taking: Reported on 09/14/2021)   Facility-Administered Medications Prior to Visit  Medication Dose Route  Frequency Provider   bupivacaine (MARCAINE) 0.5 % injection 2 mL  2 mL Infiltration Once Opalski, Deborah, DO   lidocaine (XYLOCAINE) 2 % (with pres) injection 20 mg  1 mL Other Once Opalski, Deborah, DO   methylPREDNISolone acetate (DEPO-MEDROL) injection 40 mg  40 mg Intra-articular Once Opalski, Deborah, DO    Review of Systems Review of Systems:  A fourteen system review of systems was performed and found to be positive as per HPI.  Last CBC Lab Results  Component Value Date   WBC 5.1 11/21/2020   HGB 12.3 11/21/2020   HCT 36.4 11/21/2020   MCV 91 11/21/2020   MCH 30.6 11/21/2020   RDW 12.6 11/21/2020   PLT 267 11/21/2020   Last metabolic panel Lab Results  Component Value Date   GLUCOSE 83 11/21/2020   NA 140 11/21/2020   K 3.9 11/21/2020   CL 102 11/21/2020   CO2 23 11/21/2020   BUN 15 11/21/2020   CREATININE 0.72 11/21/2020   EGFR 88 11/21/2020   CALCIUM 9.2 11/21/2020   PROT 6.4 11/21/2020   ALBUMIN 3.9 11/21/2020   LABGLOB 2.5 11/21/2020   AGRATIO 1.6 11/21/2020   BILITOT 0.6 11/21/2020   ALKPHOS 99 11/21/2020   AST 22 11/21/2020   ALT 19 11/21/2020   ANIONGAP 4 (L) 08/30/2020   Last lipids Lab Results  Component Value Date   CHOL 214 (H) 11/21/2020   HDL 77 11/21/2020   LDLCALC 125 (H) 11/21/2020   TRIG 70 11/21/2020     CHOLHDL 2.8 11/21/2020   Last hemoglobin A1c Lab Results  Component Value Date   HGBA1C 5.8 (H) 11/21/2020   Last thyroid functions Lab Results  Component Value Date   TSH 1.440 11/21/2020   T3TOTAL 116 11/20/2018   Last vitamin D Lab Results  Component Value Date   VD25OH 49.4 11/21/2020     Objective    There were no vitals taken for this visit. BP Readings from Last 3 Encounters:  11/24/20 121/67  11/24/20 (!) 148/80  08/31/20 (!) 113/58   Wt Readings from Last 3 Encounters:  04/21/21 163 lb 6.4 oz (74.1 kg)  11/24/20 163 lb 6.4 oz (74.1 kg)  11/24/20 158 lb (71.7 kg)    Physical Exam  General:  Pleasant  and cooperative, appropriate for stated age.  Neuro:  Alert and oriented,  extra-ocular muscles intact  HEENT:  Normocephalic, atraumatic, neck supple  Skin:  no gross rash, warm, pink. Cardiac:  RRR, S1 S2 Respiratory: CTA B/L  Vascular:  Ext warm, no cyanosis apprec.; cap RF less 2 sec. Psych:  No HI/SI, judgement and insight good, Euthymic mood. Full Affect.   No results found for any visits on 09/14/21.  Assessment & Plan      Problem List Items Addressed This Visit       Other   Vitamin D insufficiency   Relevant Medications   Vitamin D, Ergocalciferol, (DRISDOL) 1.25 MG (50000 UNIT) CAPS capsule   Other Visit Diagnoses     Paresthesia    -  Primary      Vitamin D insufficiency: -Last Vitamin D normal at 49.4. Patient due for routine labs for annual medicare wellness September 2023 so will repeat Vitamin D at that time. Provided medication refill.  Paresthesia: -Discussed with patient various etiologies for paresthesia/neuropathy. Will collect B12 with future labs. Discussed nerve conduction study and/or referral to neurology. Pt will let me know if decides to pursue referral.    Return for MCW and FBW include B12 after 11/24/21.        Maritza Abonza, PA-C  [facility] [phone] (phone) [phone] (fax)  [facility] 

## 2021-11-24 ENCOUNTER — Other Ambulatory Visit: Payer: Self-pay

## 2021-11-24 DIAGNOSIS — E569 Vitamin deficiency, unspecified: Secondary | ICD-10-CM

## 2021-11-24 DIAGNOSIS — Z0000 Encounter for general adult medical examination without abnormal findings: Secondary | ICD-10-CM

## 2021-11-24 DIAGNOSIS — R7303 Prediabetes: Secondary | ICD-10-CM

## 2021-11-24 DIAGNOSIS — E559 Vitamin D deficiency, unspecified: Secondary | ICD-10-CM

## 2021-11-26 ENCOUNTER — Other Ambulatory Visit: Payer: Medicare Other

## 2021-11-26 DIAGNOSIS — E559 Vitamin D deficiency, unspecified: Secondary | ICD-10-CM

## 2021-11-26 DIAGNOSIS — Z0000 Encounter for general adult medical examination without abnormal findings: Secondary | ICD-10-CM | POA: Diagnosis not present

## 2021-11-26 DIAGNOSIS — E569 Vitamin deficiency, unspecified: Secondary | ICD-10-CM

## 2021-11-26 DIAGNOSIS — R7303 Prediabetes: Secondary | ICD-10-CM

## 2021-11-27 LAB — CBC WITH DIFFERENTIAL/PLATELET
Basophils Absolute: 0 10*3/uL (ref 0.0–0.2)
Basos: 1 %
EOS (ABSOLUTE): 0.3 10*3/uL (ref 0.0–0.4)
Eos: 5 %
Hematocrit: 37.7 % (ref 34.0–46.6)
Hemoglobin: 12.8 g/dL (ref 11.1–15.9)
Immature Grans (Abs): 0 10*3/uL (ref 0.0–0.1)
Immature Granulocytes: 0 %
Lymphocytes Absolute: 1.6 10*3/uL (ref 0.7–3.1)
Lymphs: 30 %
MCH: 30.8 pg (ref 26.6–33.0)
MCHC: 34 g/dL (ref 31.5–35.7)
MCV: 91 fL (ref 79–97)
Monocytes Absolute: 0.7 10*3/uL (ref 0.1–0.9)
Monocytes: 12 %
Neutrophils Absolute: 2.8 10*3/uL (ref 1.4–7.0)
Neutrophils: 52 %
Platelets: 255 10*3/uL (ref 150–450)
RBC: 4.15 x10E6/uL (ref 3.77–5.28)
RDW: 12.6 % (ref 11.7–15.4)
WBC: 5.4 10*3/uL (ref 3.4–10.8)

## 2021-11-27 LAB — COMPREHENSIVE METABOLIC PANEL
ALT: 24 IU/L (ref 0–32)
AST: 21 IU/L (ref 0–40)
Albumin/Globulin Ratio: 1.9 (ref 1.2–2.2)
Albumin: 4.2 g/dL (ref 3.8–4.8)
Alkaline Phosphatase: 86 IU/L (ref 44–121)
BUN/Creatinine Ratio: 17 (ref 12–28)
BUN: 14 mg/dL (ref 8–27)
Bilirubin Total: 0.7 mg/dL (ref 0.0–1.2)
CO2: 26 mmol/L (ref 20–29)
Calcium: 9.3 mg/dL (ref 8.7–10.3)
Chloride: 101 mmol/L (ref 96–106)
Creatinine, Ser: 0.82 mg/dL (ref 0.57–1.00)
Globulin, Total: 2.2 g/dL (ref 1.5–4.5)
Glucose: 94 mg/dL (ref 70–99)
Potassium: 4.1 mmol/L (ref 3.5–5.2)
Sodium: 136 mmol/L (ref 134–144)
Total Protein: 6.4 g/dL (ref 6.0–8.5)
eGFR: 75 mL/min/{1.73_m2} (ref >59)

## 2021-11-27 LAB — TSH: TSH: 1.44 u[IU]/mL (ref 0.450–4.500)

## 2021-11-27 LAB — LIPID PANEL
Chol/HDL Ratio: 3 ratio (ref 0.0–4.4)
Cholesterol, Total: 225 mg/dL — ABNORMAL HIGH (ref 100–199)
HDL: 76 mg/dL (ref >39)
LDL Chol Calc (NIH): 135 mg/dL — ABNORMAL HIGH (ref 0–99)
Triglycerides: 80 mg/dL (ref 0–149)
VLDL Cholesterol Cal: 14 mg/dL (ref 5–40)

## 2021-11-27 LAB — VITAMIN D 25 HYDROXY (VIT D DEFICIENCY, FRACTURES): Vit D, 25-Hydroxy: 57.2 ng/mL (ref 30.0–100.0)

## 2021-11-27 LAB — VITAMIN B12: Vitamin B-12: 1359 pg/mL — ABNORMAL HIGH (ref 232–1245)

## 2021-11-27 LAB — HEMOGLOBIN A1C
Est. average glucose Bld gHb Est-mCnc: 120 mg/dL
Hgb A1c MFr Bld: 5.8 % — ABNORMAL HIGH (ref 4.8–5.6)

## 2021-11-30 ENCOUNTER — Ambulatory Visit: Payer: Medicare Other | Admitting: Urology

## 2021-12-02 ENCOUNTER — Encounter: Payer: Self-pay | Admitting: Physician Assistant

## 2021-12-02 ENCOUNTER — Ambulatory Visit (INDEPENDENT_AMBULATORY_CARE_PROVIDER_SITE_OTHER): Payer: Medicare Other | Admitting: Physician Assistant

## 2021-12-02 VITALS — BP 123/74 | HR 73 | Temp 97.7°F | Ht 63.5 in | Wt 176.0 lb

## 2021-12-02 DIAGNOSIS — F419 Anxiety disorder, unspecified: Secondary | ICD-10-CM | POA: Diagnosis not present

## 2021-12-02 DIAGNOSIS — E7800 Pure hypercholesterolemia, unspecified: Secondary | ICD-10-CM | POA: Diagnosis not present

## 2021-12-02 DIAGNOSIS — Z0000 Encounter for general adult medical examination without abnormal findings: Secondary | ICD-10-CM | POA: Diagnosis not present

## 2021-12-02 DIAGNOSIS — R7303 Prediabetes: Secondary | ICD-10-CM

## 2021-12-02 MED ORDER — CITALOPRAM HYDROBROMIDE 10 MG PO TABS: 10.0000 mg | ORAL_TABLET | Freq: Every day | ORAL | 3 refills | Status: DC

## 2021-12-02 NOTE — Patient Instructions (Signed)
Preventive Care 65 Years and Older, Female Preventive care refers to lifestyle choices and visits with your health care provider that can promote health and wellness. Preventive care visits are also called wellness exams. What can I expect for my preventive care visit? Counseling Your health care provider may ask you questions about your: Medical history, including: Past medical problems. Family medical history. Pregnancy and menstrual history. History of falls. Current health, including: Memory and ability to understand (cognition). Emotional well-being. Home life and relationship well-being. Sexual activity and sexual health. Lifestyle, including: Alcohol, nicotine or tobacco, and drug use. Access to firearms. Diet, exercise, and sleep habits. Work and work environment. Sunscreen use. Safety issues such as seatbelt and bike helmet use. Physical exam Your health care provider will check your: Height and weight. These may be used to calculate your BMI (body mass index). BMI is a measurement that tells if you are at a healthy weight. Waist circumference. This measures the distance around your waistline. This measurement also tells if you are at a healthy weight and may help predict your risk of certain diseases, such as type 2 diabetes and high blood pressure. Heart rate and blood pressure. Body temperature. Skin for abnormal spots. What immunizations do I need?  Vaccines are usually given at various ages, according to a schedule. Your health care provider will recommend vaccines for you based on your age, medical history, and lifestyle or other factors, such as travel or where you work. What tests do I need? Screening Your health care provider may recommend screening tests for certain conditions. This may include: Lipid and cholesterol levels. Hepatitis C test. Hepatitis B test. HIV (human immunodeficiency virus) test. STI (sexually transmitted infection) testing, if you are at  risk. Lung cancer screening. Colorectal cancer screening. Diabetes screening. This is done by checking your blood sugar (glucose) after you have not eaten for a while (fasting). Mammogram. Talk with your health care provider about how often you should have regular mammograms. BRCA-related cancer screening. This may be done if you have a family history of breast, ovarian, tubal, or peritoneal cancers. Bone density scan. This is done to screen for osteoporosis. Talk with your health care provider about your test results, treatment options, and if necessary, the need for more tests. Follow these instructions at home: Eating and drinking  Eat a diet that includes fresh fruits and vegetables, whole grains, lean protein, and low-fat dairy products. Limit your intake of foods with high amounts of sugar, saturated fats, and salt. Take vitamin and mineral supplements as recommended by your health care provider. Do not drink alcohol if your health care provider tells you not to drink. If you drink alcohol: Limit how much you have to 0-1 drink a day. Know how much alcohol is in your drink. In the [place] one drink equals one 12 oz bottle of beer (355 mL), one 5 oz glass of wine (148 mL), or one 1 oz glass of hard liquor (44 mL). Lifestyle Brush your teeth every morning and night with fluoride toothpaste. Floss one time each day. Exercise for at least 30 minutes 5 or more days each week. Do not use any products that contain nicotine or tobacco. These products include cigarettes, chewing tobacco, and vaping devices, such as e-cigarettes. If you need help quitting, ask your health care provider. Do not use drugs. If you are sexually active, practice safe sex. Use a condom or other form of protection in order to prevent STIs. Take aspirin only as told by   your health care provider. Make sure that you understand how much to take and what form to take. Work with your health care provider to find out whether it  is safe and beneficial for you to take aspirin daily. Ask your health care provider if you need to take a cholesterol-lowering medicine (statin). Find healthy ways to manage stress, such as: Meditation, yoga, or listening to music. Journaling. Talking to a trusted person. Spending time with friends and family. Minimize exposure to UV radiation to reduce your risk of skin cancer. Safety Always wear your seat belt while driving or riding in a vehicle. Do not drive: If you have been drinking alcohol. Do not ride with someone who has been drinking. When you are tired or distracted. While texting. If you have been using any mind-altering substances or drugs. Wear a helmet and other protective equipment during sports activities. If you have firearms in your house, make sure you follow all gun safety procedures. What's next? Visit your health care provider once a year for an annual wellness visit. Ask your health care provider how often you should have your eyes and teeth checked. Stay up to date on all vaccines. This information is not intended to replace advice given to you by your health care provider. Make sure you discuss any questions you have with your health care provider. Document Revised: 08/13/2020 Document Reviewed: 08/13/2020 Elsevier Patient Education  2023 [employer].  

## 2021-12-02 NOTE — Progress Notes (Signed)
Subjective:   Lindsay [name] is a 76 y.o. female who presents for Medicare Annual (Subsequent) preventive examination.  Review of Systems    General:   No F/C, wt loss, +fatigue Pulm:   No DIB, SOB, pleuritic chest pain Card:  No CP, palpitations Abd:  No n/v/d or pain Ext:  No inc edema from baseline        Objective:    Today's Vitals   12/02/21 1539  BP: 123/74  Pulse: 73  Temp: 97.7 F (36.5 C)  TempSrc: Temporal  Weight: 176 lb (79.8 kg)  Height: 5' 3.5" (1.613 m)   Body mass index is 30.69 kg/m.     08/29/2020    4:30 PM 08/25/2020    3:05 PM 05/04/2019   11:20 AM 12/17/2016   11:22 AM 08/27/2016   11:14 AM 08/05/2011    1:34 PM 08/05/2011    6:14 AM  Advanced Directives  Does Patient Have a Medical Advance Directive? No No No No No Patient does not have advance directive Patient does not have advance directive  Would patient like information on creating a medical advance directive? No - Patient declined No - Patient declined No - Patient declined  No - Patient declined    Pre-existing out of facility DNR order (yellow form or pink MOST form)      No     Current Medications (verified) Outpatient Encounter Medications as of 12/02/2021  Medication Sig   Ascorbic Acid (VITAMIN C) 1000 MG tablet Take 1,000 mg by mouth at bedtime.   citalopram (CELEXA) 10 MG tablet Take 1 tablet (10 mg total) by mouth daily.   ELDERBERRY PO Take 5 mLs by mouth at bedtime.   folic acid (FOLVITE) 828 MCG tablet Take 800 mcg by mouth daily.   GINSENG PO Take 25 drops by mouth daily.   levothyroxine (SYNTHROID, LEVOTHROID) 25 MCG tablet Take 25 mcg by mouth daily.   Multiple Vitamin (MULTIVITAMIN) tablet Take 1 tablet by mouth daily.   Multiple Vitamins-Minerals (ZINC PO) Take 1 tablet by mouth daily.   Nutritional Supplements (WELLNESS ESSENTIALS PO) Take 2 capsules by mouth daily.   Probiotic Product (PROBIOTIC FORMULA PO) Take 1 capsule by mouth daily. New chapter All-Flora.    Propylene Glycol (SYSTANE COMPLETE OP) Place 1 drop into both eyes daily.   sodium fluoride (FLUORISHIELD) 1.1 % GEL dental gel Place 1 application onto teeth at bedtime.   TURMERIC PO Take 1 tablet by mouth daily.   Vitamin D, Ergocalciferol, (DRISDOL) 1.25 MG (50000 UNIT) CAPS capsule TAKE 1 CAPSULE BY MOUTH WEEKLY   Facility-Administered Encounter Medications as of 12/02/2021  Medication   bupivacaine (MARCAINE) 0.5 % injection 2 mL   lidocaine (XYLOCAINE) 2 % (with pres) injection 20 mg   methylPREDNISolone acetate (DEPO-MEDROL) injection 40 mg    Allergies (verified) [place]   History: Past Medical History:  Diagnosis Date   Arthritis    Dysrhythmia 2014   SVT. ablation done. no problems since   Fibromyalgia    Heart murmur 2019   Hepatitis 1967   in college with mononucleosis   Herpes 1984   Hypothyroidism    Multinodular goiter    Osteopenia    S/P AV nodal ablation 08/05/2011   SVT (supraventricular tachycardia)    Thyroid disease    thyroid nodule being monitored by Dr. Chalmers Cater   Past Surgical History:  Procedure Laterality Date   BREAST CYST ASPIRATION Bilateral 12/15/2000   CATARACT EXTRACTION W/ INTRAOCULAR LENS IMPLANT  Bilateral 2014   paragard iud     8/90   SUPRAVENTRICULAR TACHYCARDIA ABLATION N/A 08/05/2011   Procedure: SUPRAVENTRICULAR TACHYCARDIA ABLATION;  Surgeon: Evans Lance, MD;  Location: [facility] CATH LAB;  Service: Cardiovascular;  Laterality: N/A;   TONSILLECTOMY  1950   TOTAL HIP ARTHROPLASTY Left 08/29/2020   Procedure: LEFT TOTAL HIP ARTHROPLASTY ANTERIOR APPROACH;  Surgeon: Mcarthur Rossetti, MD;  Location: [facility] ORS;  Service: Orthopedics;  Laterality: Left;   Family History  Problem Relation Age of Onset   Dementia Mother 28       alive   Transient ischemic attack Mother        hx of; has a pacemaker   Hypothyroidism Mother    Other Father 19       old age. Develop CHF the last 4 months   Other Sister 58       died of thymoma    Hypothyroidism Sister    Other Maternal Aunt        nasal tumor   Heart attack Maternal Grandfather    Hypothyroidism Maternal Aunt    Stroke Paternal Grandfather    Colon cancer Neg Hx    Esophageal cancer Neg Hx    Pancreatic cancer Neg Hx    Prostate cancer Neg Hx    Rectal cancer Neg Hx    Stomach cancer Neg Hx    Social History   Socioeconomic History   Marital status: Divorced    Spouse name: Not on file   Number of children: 2   Years of education: Not on file   Highest education level: Not on file  Occupational History   Not on file  Tobacco Use   Smoking status: Former    Packs/day: 2.00    Years: 2.00    Total pack years: 4.00    Types: Cigarettes    Start date: 03/01/1964    Quit date: 03/01/1970    Years since quitting: 51.7   Smokeless tobacco: Never  Vaping Use   Vaping Use: Never used  Substance and Sexual Activity   Alcohol use: Yes    Alcohol/week: 0.0 standard drinks of alcohol    Comment: occasional   Drug use: No   Sexual activity: Never    Birth control/protection: Post-menopausal  Other Topics Concern   Not on file  Social History Narrative   Not on file   Social Determinants of Health   Financial Resource Strain: Not on file  Food Insecurity: Not on file  Transportation Needs: Not on file  Physical Activity: Not on file  Stress: Not on file  Social Connections: Not on file    Tobacco Counseling Counseling given: Not Answered     Diabetic? no         Activities of Daily Living    12/02/2021    3:40 PM 09/14/2021    2:42 PM  In your present state of health, do you have any difficulty performing the following activities:  Hearing? 0 0  Vision? 1 0  Difficulty concentrating or making decisions? 0 0  Walking or climbing stairs? 1 0  Dressing or bathing? 1 0  Doing errands, shopping? 0 0    Patient Care Team: Lorrene Reid, PA-C as PCP - General Adrian Prows, MD as Consulting Physician (Cardiology) Evans Lance, MD  as Consulting Physician (Cardiology) Juanita Craver, MD as Consulting Physician (Gastroenterology) Jacelyn Pi, MD as Consulting Physician (Endocrinology) Macarthur Critchley, [place] as Referring Physician (Optometry) Darleen Crocker, MD as Consulting  Physician (Ophthalmology) Linton Rump, PT as Physical Therapist (Physical Therapy) Megan Salon, MD as Consulting Physician (Gynecology)  Indicate any recent Medical Services you may have received from other than [facility] providers in the past year (date may be approximate).     Assessment:   This is a routine wellness examination for Carolyn.  Hearing/Vision screen No results found.  Dietary issues and exercise activities discussed: -Patient follows a heart healthy diet low in fat and carbohydrates. Has started walking routinely.    Goals Addressed   None   Depression Screen    12/02/2021    3:40 PM 09/14/2021    2:41 PM 04/21/2021   10:12 AM 11/24/2020    4:07 PM 08/11/2020    2:38 PM 06/04/2020    3:17 PM 11/26/2019    9:59 AM  PHQ 2/9 Scores  PHQ - 2 Score 1 0 2 1 0 0 0  PHQ- 9 Score 4 0 2 2 0  2    Fall Risk    12/02/2021    3:39 PM 09/14/2021    2:41 PM 11/24/2020    4:07 PM 08/11/2020    2:38 PM 06/04/2020    3:17 PM  [place] in the past year? 0 0 0 1 1  Number falls in past yr: 0 0 0 0   Injury with Fall? 0 0 0 1   Risk for fall due to : No Fall Risks No Fall Risks No Fall Risks History of fall(s)   Follow up Falls evaluation completed Falls evaluation completed Falls evaluation completed Falls evaluation completed Falls evaluation completed    [place]:  Any stairs in or around the home? Yes  If so, are there any without handrails? Yes  Home free of loose throw rugs in walkways, pet beds, electrical cords, etc? Yes  Adequate lighting in your home to reduce risk of falls? Yes   ASSISTIVE DEVICES UTILIZED TO PREVENT FALLS:  Life alert? No  Use of a cane, walker or w/c? No  Grab bars  in the bathroom? Yes  Shower chair or bench in shower? No  Elevated toilet seat or a handicapped toilet? Yes   TIMED UP AND GO:  Was the test performed? Yes .  Length of time to ambulate 10 feet: 10 sec.   Gait slow and steady without use of assistive device  Cognitive Function:        12/02/2021    3:18 PM 11/24/2020    3:11 PM 11/26/2019    9:59 AM 11/20/2018   11:59 AM 08/29/2017    1:36 PM  6CIT Screen  What Year? 0 points 0 points 0 points 0 points 0 points  What month? 0 points 0 points 0 points 0 points 0 points  What time? 0 points 0 points 0 points 0 points 0 points  Count back from 20 0 points 2 points 2 points 0 points 0 points  Months in reverse 0 points 0 points 0 points 0 points 0 points  Repeat phrase 0 points 0 points 0 points 0 points 0 points  Total Score 0 points 2 points 2 points 0 points 0 points    Immunizations Immunization History  Administered Date(s) Administered   DTaP 03/02/2015   Fluad Quad(high Dose 65+) 11/24/2020   Influenza, Quadrivalent, Recombinant, Inj, Pf 12/10/2016, 12/09/2017, 12/15/2018, 01/04/2020   Moderna Sars-Covid-2 Vaccination 03/23/2019, 04/20/2019   PFIZER(Purple Top)SARS-COV-2 Vaccination 12/23/2019, 08/16/2020   Pneumococcal Conjugate-13 06/14/2015  Pneumococcal Polysaccharide-23 06/22/2013   Tdap 06/28/2007    TDAP status: Up to date  Flu Vaccine status: Due, Education has been provided regarding the importance of this vaccine. Advised may receive this vaccine at local pharmacy or Health Dept. Aware to provide a copy of the vaccination record if obtained from local pharmacy or Health Dept. Verbalized acceptance and understanding.  Pneumococcal vaccine status: Up to date  Covid-19 vaccine status: Completed vaccines  Qualifies for Shingles Vaccine? Yes   Zostavax completed No   Shingrix Completed?: No.    Education has been provided regarding the importance of this vaccine. Patient has been advised to call insurance  company to determine out of pocket expense if they have not yet received this vaccine. Advised may also receive vaccine at local pharmacy or Health Dept. Verbalized acceptance and understanding.  Screening Tests Health Maintenance  Topic Date Due   Zoster Vaccines- Shingrix (1 of 2) Never done   COVID-19 Vaccine (5 - Mixed Product risk series) 10/11/2020   INFLUENZA VACCINE  09/29/2021   Hepatitis C Screening  08/26/2028 (Originally 07/07/1963)   TETANUS/TDAP  04/01/2025   COLONOSCOPY (Pts 45-58yr Insurance coverage will need to be confirmed)  12/28/2026   Pneumonia Vaccine 75 Years old  Completed   DEXA SCAN  Completed   HPV VACCINES  Aged Out    Health Maintenance  Health Maintenance Due  Topic Date Due   Zoster Vaccines- Shingrix (1 of 2) Never done   COVID-19 Vaccine (5 - Mixed Product risk series) 10/11/2020   INFLUENZA VACCINE  09/29/2021    Colorectal cancer screening: Type of screening: Colonoscopy. Completed 12/27/2016. Repeat every 10 years  Mammogram status: Completed 05/18/21. Repeat every year   Bone Density status: Completed 06/08/2019. Results reflect: Bone density results: OSTEOPOROSIS. Repeat every 2 years.  Lung Cancer Screening: (Low Dose CT Chest recommended if Age 75-80years, 30 pack-year currently smoking OR have quit w/in 15years.) does not qualify.   Lung Cancer Screening Referral: n/a  Additional Screening:  Hepatitis C Screening: does qualify; Completed deferred   Vision Screening: Recommended annual ophthalmology exams for early detection of glaucoma and other disorders of the eye. Is the patient up to date with their annual eye exam?  Yes  Who is the provider or what is the name of the office in which the patient attends annual eye exams? Dr. JLuretha RuedIf pt is not established with a provider, would they like to be referred to a provider to establish care? No .   Dental Screening: Recommended annual dental exams for proper oral  hygiene  Community Resource Referral / Chronic Care Management: CRR required this visit?  No   CCM required this visit?  No      Plan:  -Reports incresed anxiety especially with driving. Wants to trial SSRI- citalopram, will send rx for citalopram 10 mg to take PO daily. -Discussed with patient most recent lab results which are essentially within normal limits or stable from prior. Patient will continue with increasing physical activity and heart healthy diet. Recommend repeating lipid panel and A1c in 6 months.   I have personally reviewed and noted the following in the patient's chart:   Medical and social history Use of alcohol, tobacco or illicit drugs  Current medications and supplements including opioid prescriptions. Patient is not currently taking opioid prescriptions. Functional ability and status Nutritional status Physical activity Advanced directives List of other physicians Hospitalizations, surgeries, and ER visits in previous 12 months Vitals Screenings to include  cognitive, depression, and falls Referrals and appointments  In addition, I have reviewed and discussed with patient certain preventive protocols, quality metrics, and best practice recommendations. A written personalized care plan for preventive services as well as general preventive health recommendations were provided to patient.     Lorrene Reid, PA-C   12/02/2021

## 2021-12-08 ENCOUNTER — Other Ambulatory Visit: Payer: Self-pay | Admitting: Endocrinology

## 2021-12-08 DIAGNOSIS — E049 Nontoxic goiter, unspecified: Secondary | ICD-10-CM

## 2021-12-09 DIAGNOSIS — H5203 Hypermetropia, bilateral: Secondary | ICD-10-CM | POA: Diagnosis not present

## 2021-12-31 ENCOUNTER — Ambulatory Visit
Admission: RE | Admit: 2021-12-31 | Discharge: 2021-12-31 | Disposition: A | Discharge status: Home / Self Care | Payer: Medicare Other | Source: Ambulatory Visit | Attending: Endocrinology | Admitting: Endocrinology

## 2021-12-31 DIAGNOSIS — E049 Nontoxic goiter, unspecified: Secondary | ICD-10-CM

## 2022-01-11 ENCOUNTER — Telehealth: Payer: Self-pay

## 2022-01-11 DIAGNOSIS — F419 Anxiety disorder, unspecified: Secondary | ICD-10-CM

## 2022-01-11 MED ORDER — CITALOPRAM HYDROBROMIDE 10 MG PO TABS: 10.0000 mg | ORAL_TABLET | Freq: Every day | ORAL | 3 refills | Status: DC

## 2022-01-11 NOTE — Telephone Encounter (Signed)
Per advisement of Lorrene Reid, PA-C, patient was informed that she is currently on lowest dose and to continue to cut tablets in half. Patient has requested a refill. Refill has been sent.

## 2022-01-11 NOTE — Telephone Encounter (Signed)
Pt is calling requesting a refill on the citalopram (CELEXA) 10 MG tablet  Pt is requesting a lower dose like '5mg'$  if it comes in that. Pt states that she has been cutting the pill in half due to it making her overly sleepy.  Please advise

## 2022-01-18 ENCOUNTER — Other Ambulatory Visit: Payer: Self-pay | Admitting: Physician Assistant

## 2022-01-18 DIAGNOSIS — E559 Vitamin D deficiency, unspecified: Secondary | ICD-10-CM

## 2022-01-27 DIAGNOSIS — E049 Nontoxic goiter, unspecified: Secondary | ICD-10-CM | POA: Diagnosis not present

## 2022-02-03 DIAGNOSIS — E049 Nontoxic goiter, unspecified: Secondary | ICD-10-CM | POA: Diagnosis not present

## 2022-03-02 IMAGING — RF DG HIP (WITH PELVIS) OPERATIVE*L*
1 series · 3 of 3 positions shown · non-contrast
Comparison: None.

CLINICAL DATA: Intra op left hip arthroplasty.

EXAM:
OPERATIVE LEFT HIP (WITH PELVIS IF PERFORMED)
TECHNIQUE: Fluoroscopic spot image(s) were submitted for interpretation
post-operatively.

[Series 1: unknown protocol · 0.20mm/px · 3 of 3 slices shown]
[im 1/3]
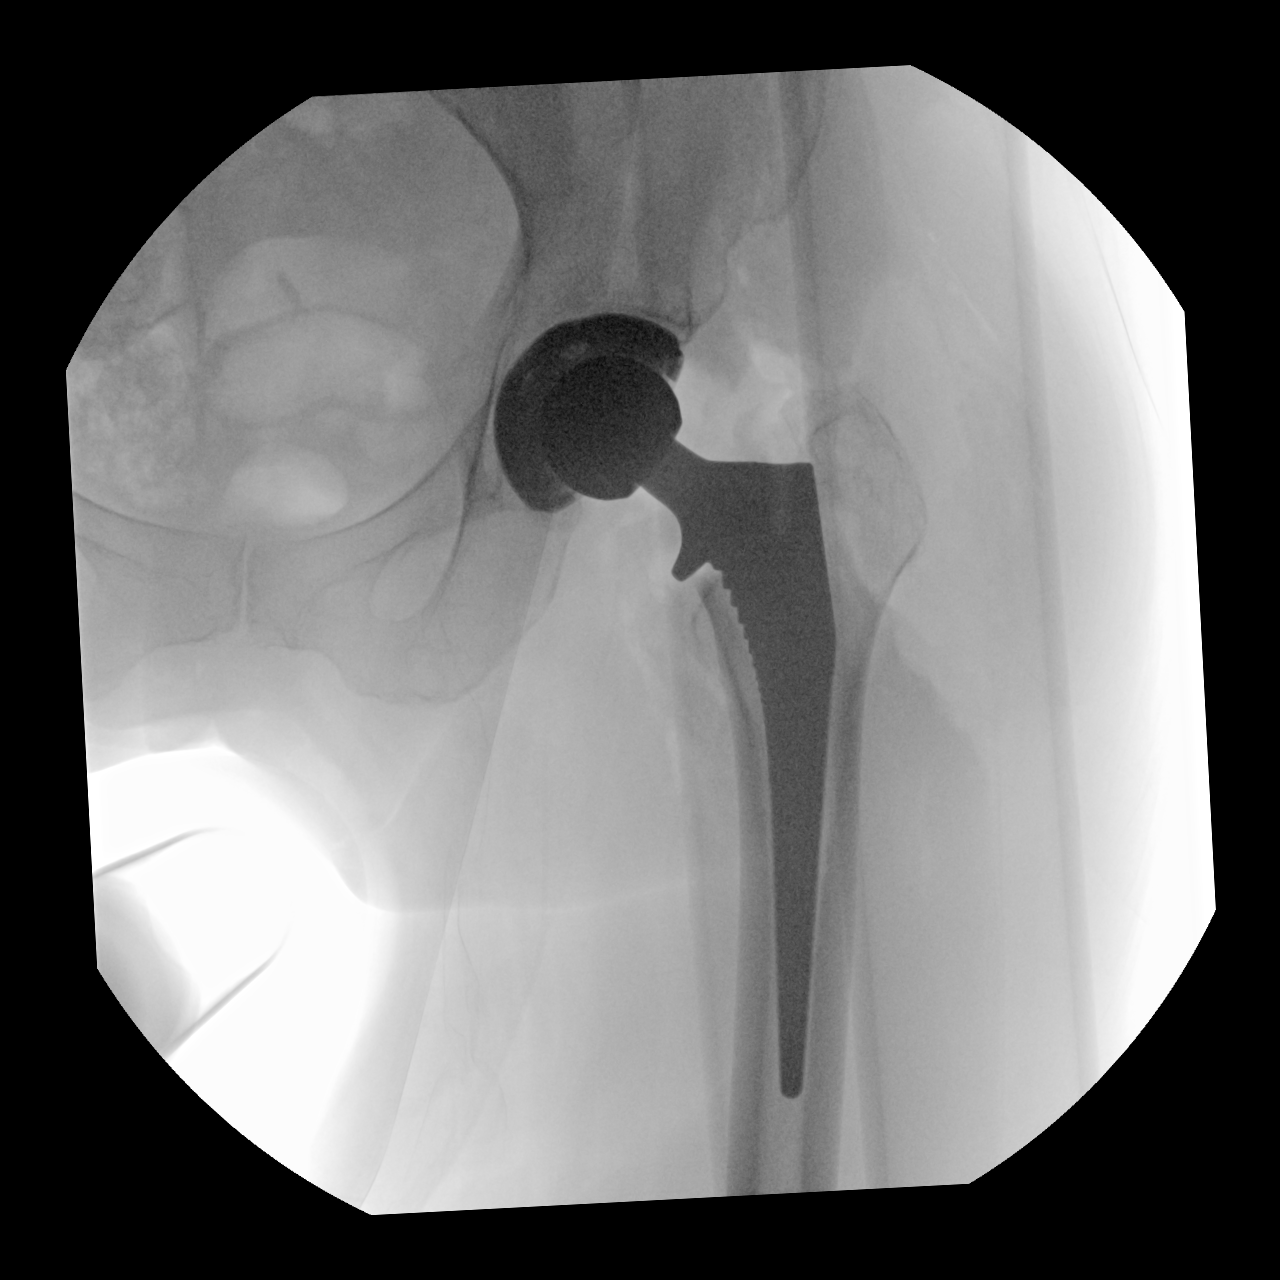
[im 2/3]
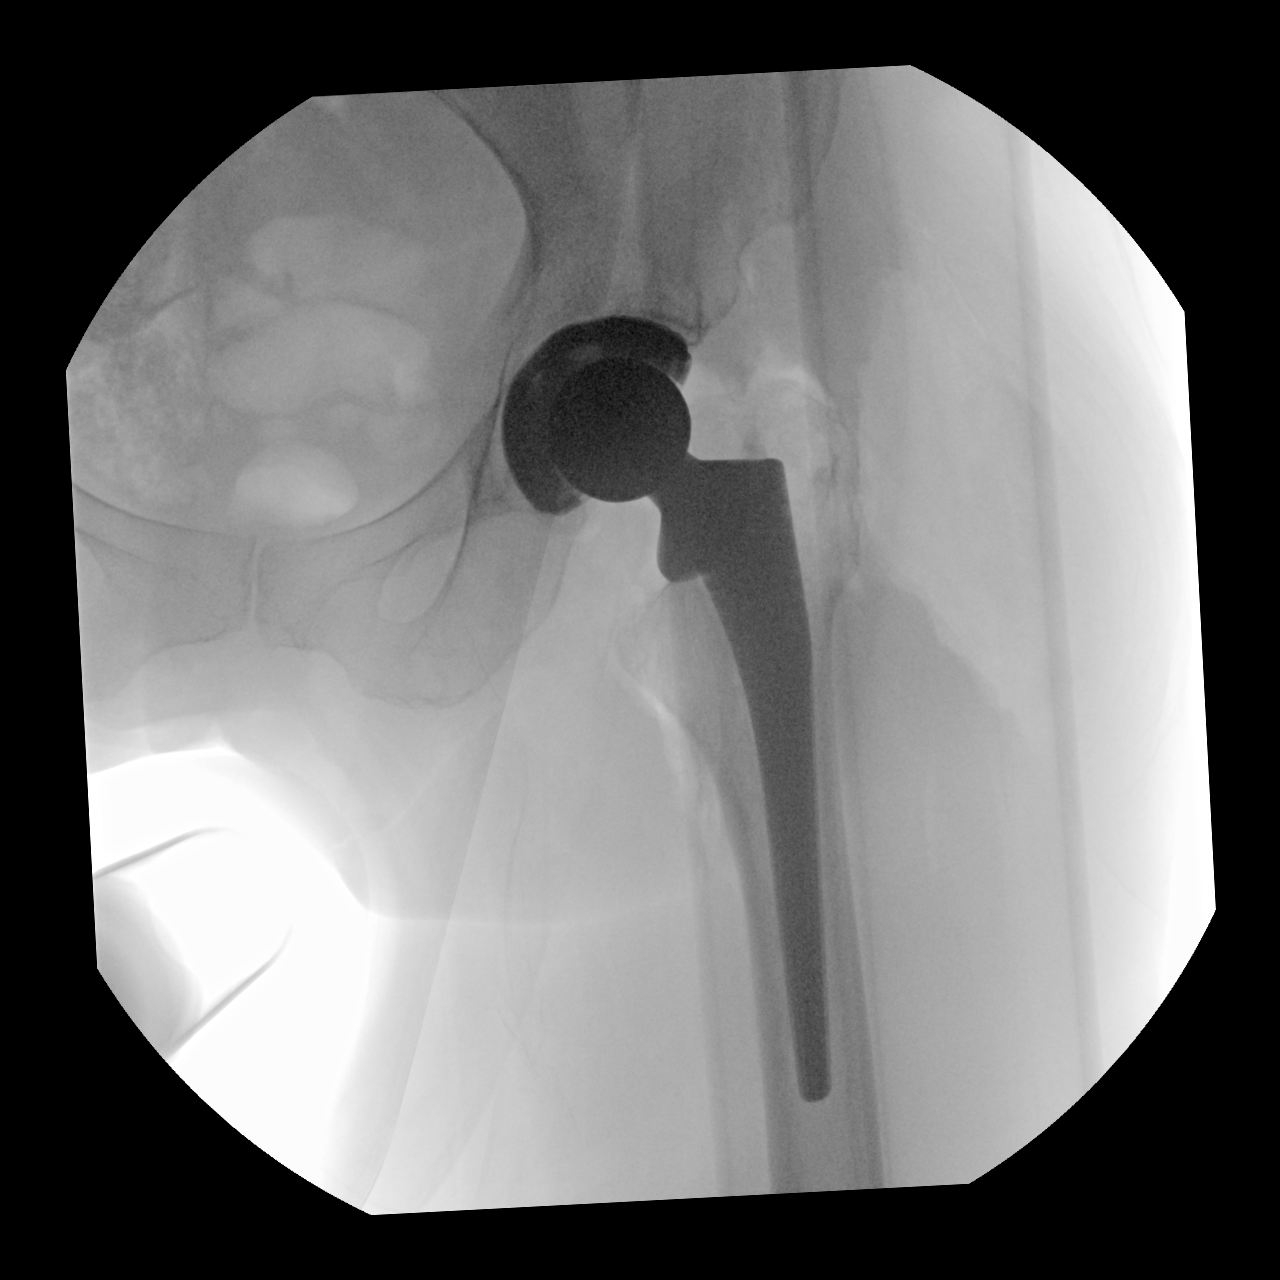
[im 3/3]
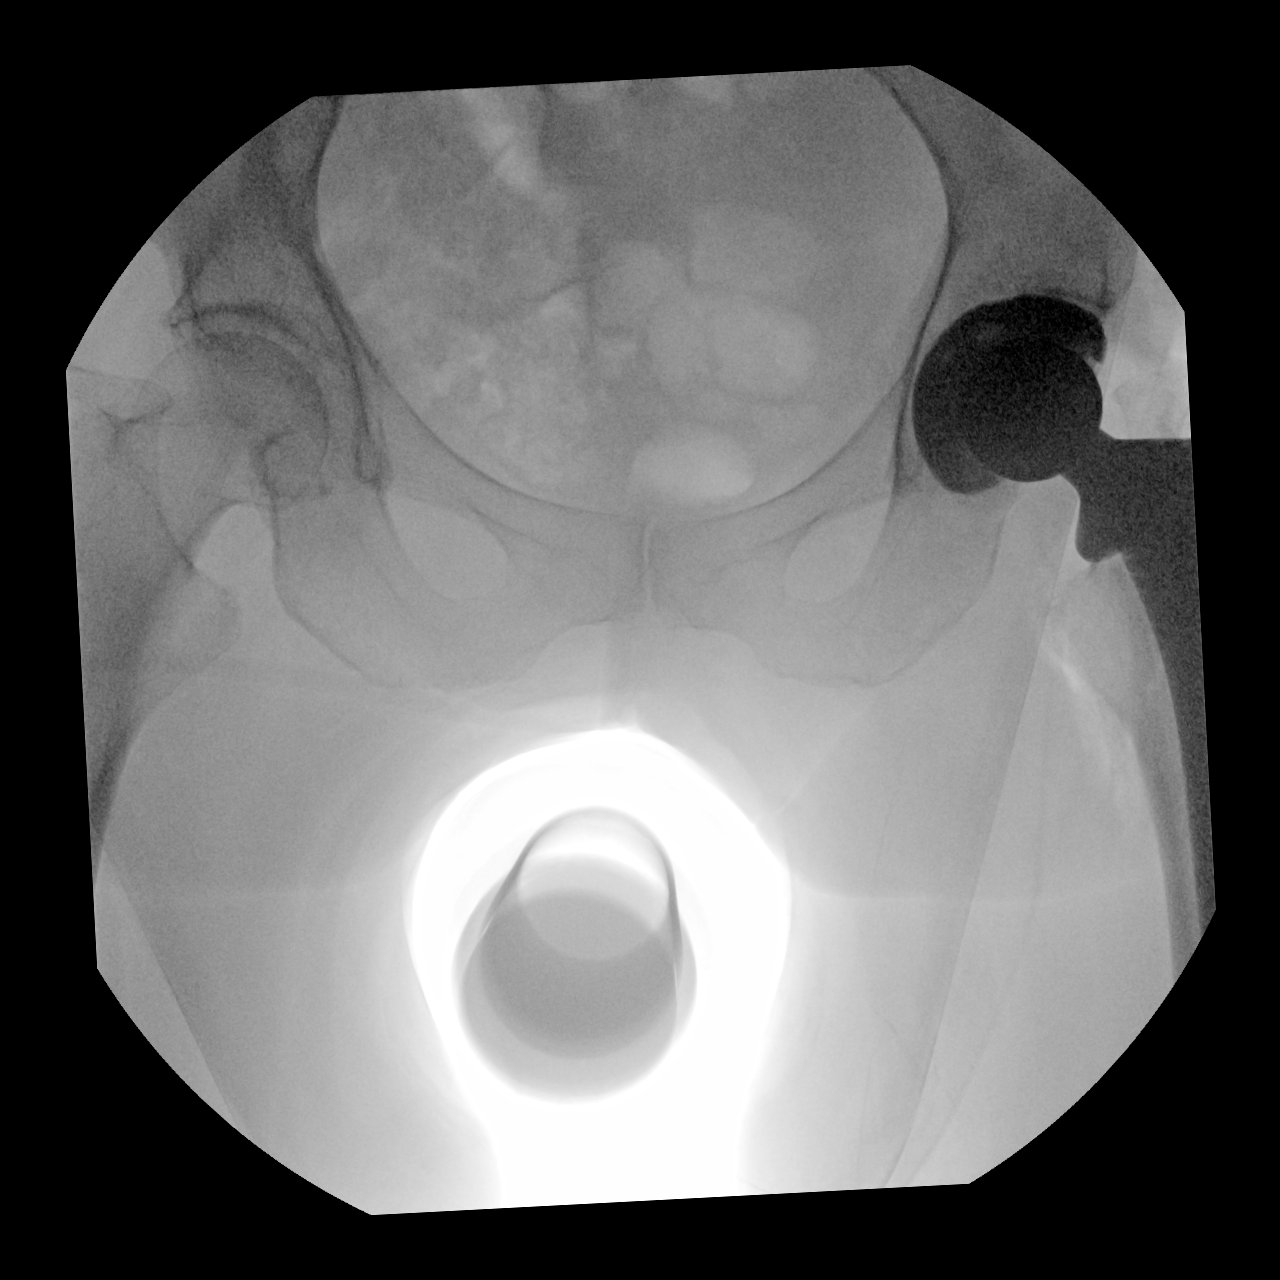

[3 of 3 positions shown; findings below may reference images not displayed]

FINDINGS: Three fluoroscopic spot views of the pelvis and left hip obtained in
the operating room. Left hip arthroplasty in place. Fluoroscopy time
26 seconds. Dose 3.3 mGy.
IMPRESSION: Procedural fluoroscopy for left hip arthroplasty.

## 2022-03-02 IMAGING — DX DG PORTABLE PELVIS
2 series · 2 of 2 positions shown · non-contrast
Comparison: Preoperative exam 05/19/2020

CLINICAL DATA: Postop.

EXAM:
PORTABLE PELVIS 1-2 VIEWS

[pelvis ap (1 of 2)]
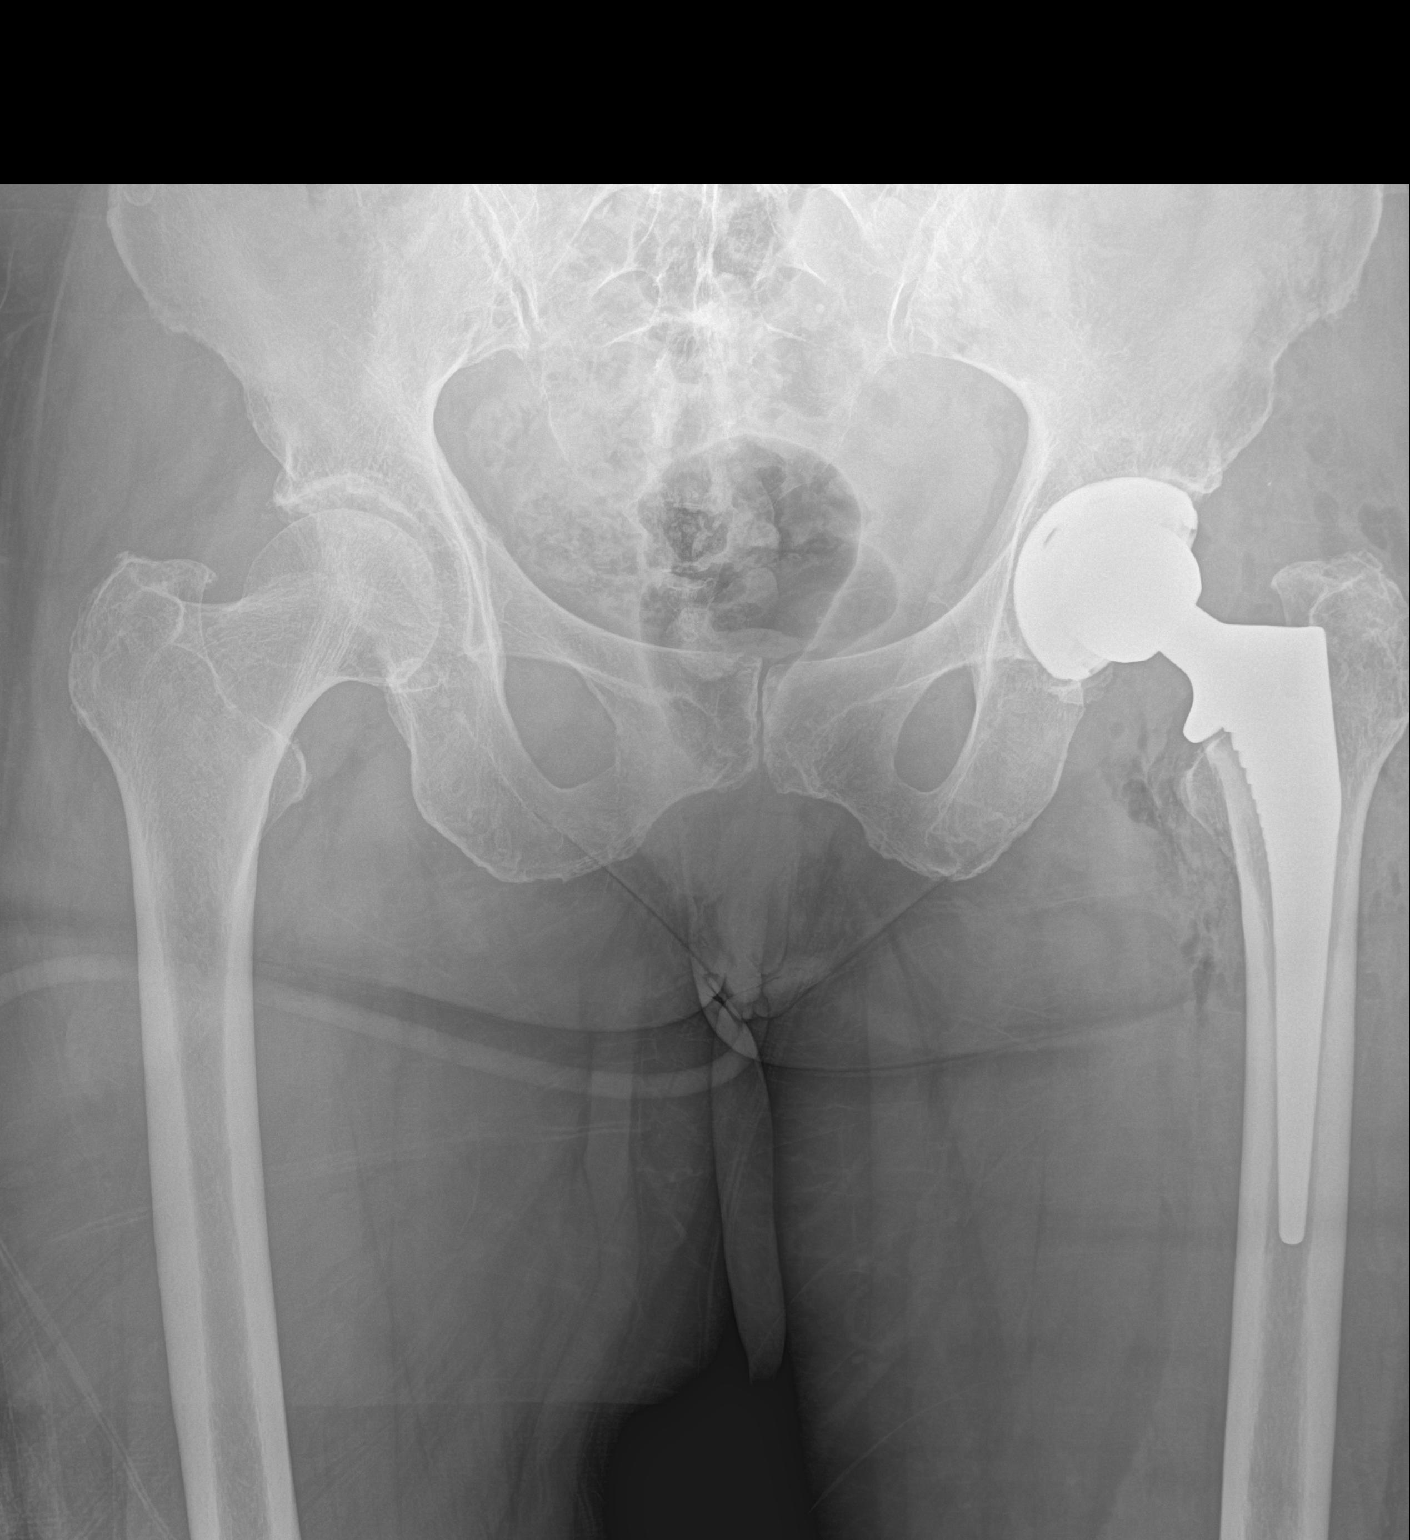

[pelvis ap (2 of 2)]
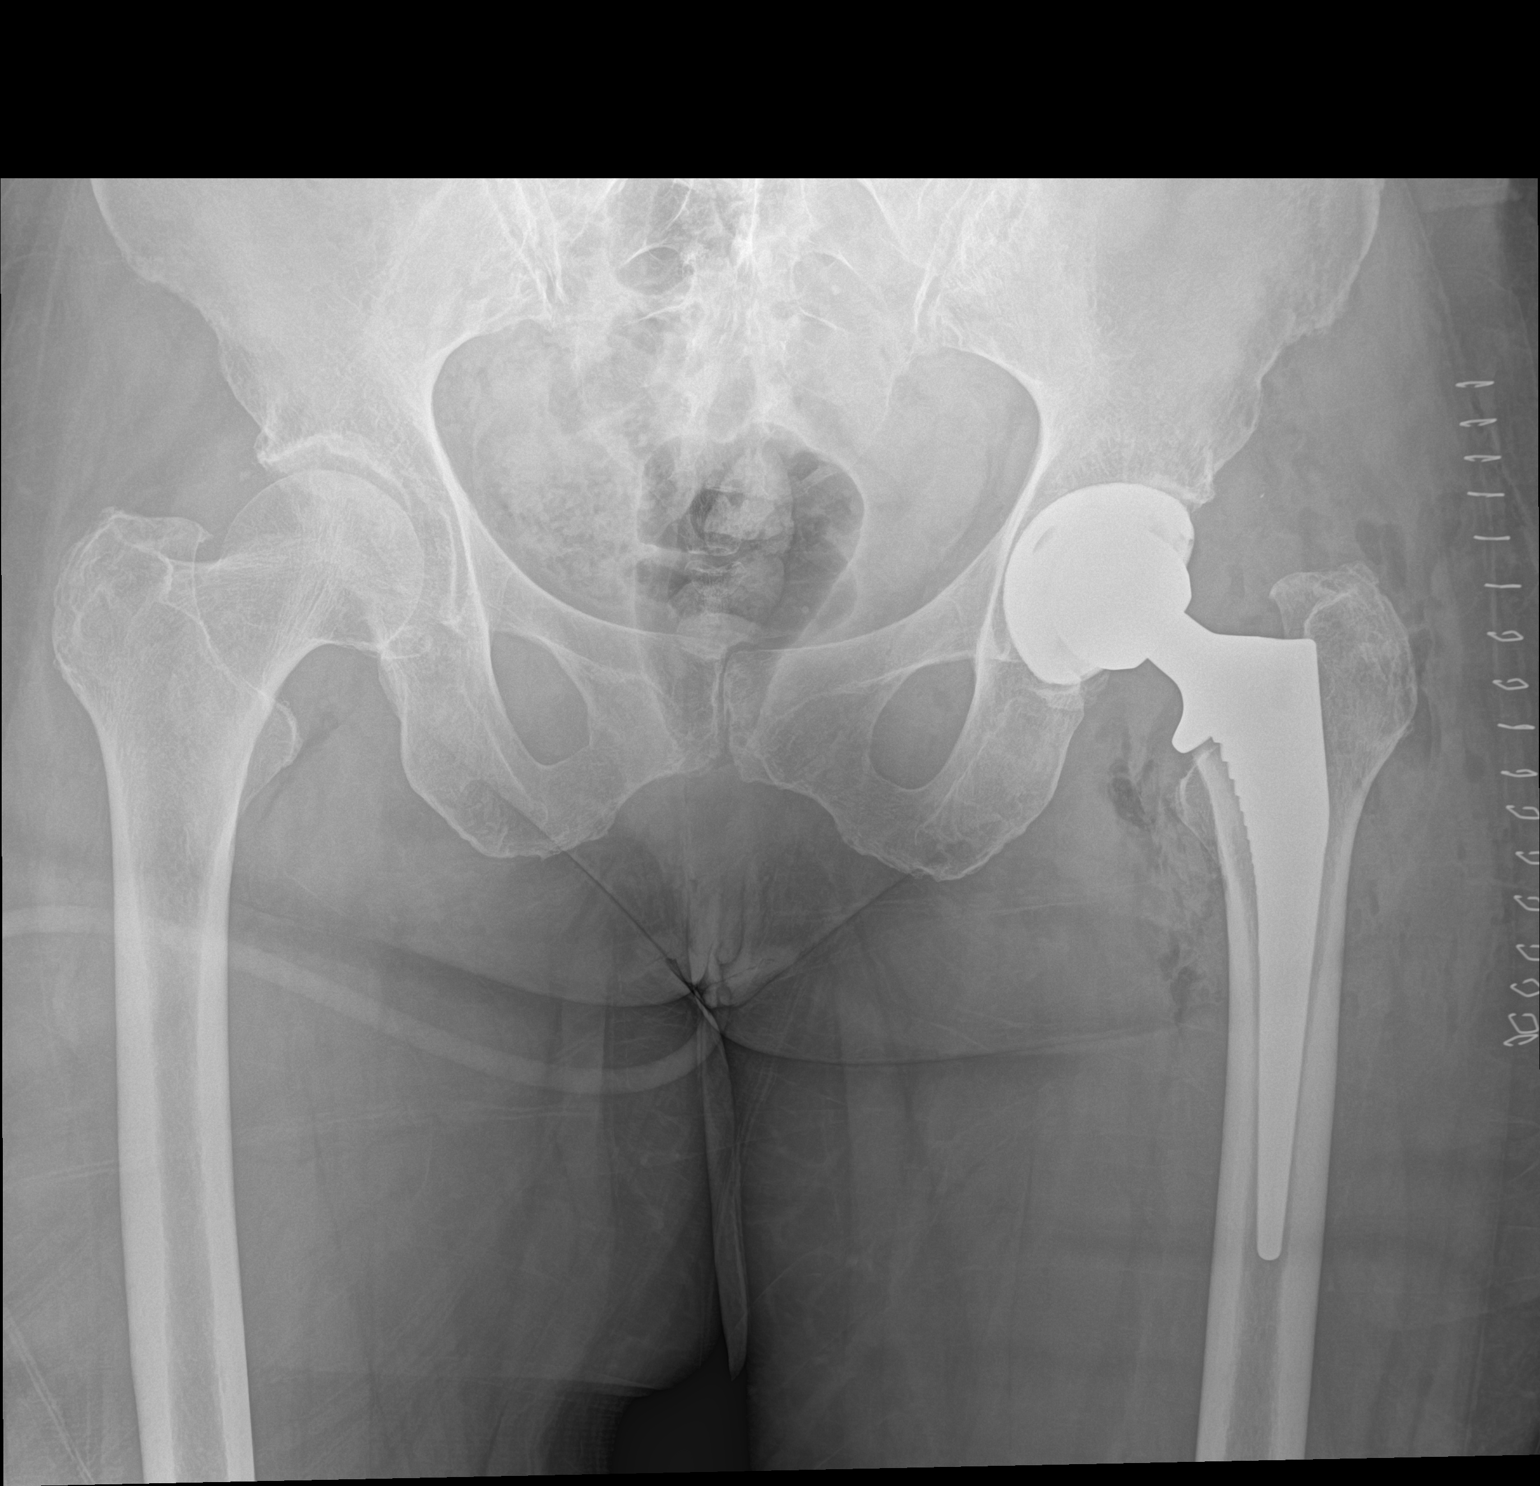

[2 of 2 positions shown; findings below may reference images not displayed]

FINDINGS: Left hip arthroplasty in expected alignment. There is no
periprosthetic lucency or fracture. Recent postsurgical change
includes air and edema in the soft tissues and joint space. Skin
staples in place.
IMPRESSION: Left hip arthroplasty without immediate postoperative complication.

## 2022-04-22 ENCOUNTER — Other Ambulatory Visit: Payer: Self-pay | Admitting: Family Medicine

## 2022-04-22 ENCOUNTER — Telehealth: Payer: Self-pay

## 2022-04-22 DIAGNOSIS — Z1231 Encounter for screening mammogram for malignant neoplasm of breast: Secondary | ICD-10-CM

## 2022-04-22 NOTE — Telephone Encounter (Signed)
Left detailed VM message notifying patient that refill request cannot be authorized without an in office visit. Requested a return call with any questions or concerns.

## 2022-04-22 NOTE — Telephone Encounter (Signed)
Pt is requesting a refill on:  Vitamin D, Ergocalciferol, (DRISDOL) 1.25 MG (50000 UNIT) CAPS capsule    Pharmacy: [facility], [place] is asking for refills to be put on the medication to prevent her from having to call each time.   This is a long term use medication for the patient

## 2022-04-22 NOTE — Telephone Encounter (Signed)
Patient was informed that it is recommended that she takes at least 2000 IU OTC until next lab draw. Patient expressed frustration that she was given a continual prescription for Vitamin D for years. Patient abruptly ended call.

## 2022-05-20 ENCOUNTER — Other Ambulatory Visit: Payer: Self-pay

## 2022-05-20 DIAGNOSIS — R7303 Prediabetes: Secondary | ICD-10-CM

## 2022-05-20 DIAGNOSIS — Z130 Encounter for screening for diseases of the blood and blood-forming organs and certain disorders involving the immune mechanism: Secondary | ICD-10-CM

## 2022-05-20 DIAGNOSIS — Z0000 Encounter for general adult medical examination without abnormal findings: Secondary | ICD-10-CM

## 2022-05-24 ENCOUNTER — Other Ambulatory Visit: Payer: Medicare Other

## 2022-05-24 DIAGNOSIS — Z0000 Encounter for general adult medical examination without abnormal findings: Secondary | ICD-10-CM | POA: Diagnosis not present

## 2022-05-24 DIAGNOSIS — Z130 Encounter for screening for diseases of the blood and blood-forming organs and certain disorders involving the immune mechanism: Secondary | ICD-10-CM

## 2022-05-24 DIAGNOSIS — R7303 Prediabetes: Secondary | ICD-10-CM | POA: Diagnosis not present

## 2022-05-24 DIAGNOSIS — Z1329 Encounter for screening for other suspected endocrine disorder: Secondary | ICD-10-CM | POA: Diagnosis not present

## 2022-05-24 DIAGNOSIS — Z1321 Encounter for screening for nutritional disorder: Secondary | ICD-10-CM | POA: Diagnosis not present

## 2022-05-24 DIAGNOSIS — Z13228 Encounter for screening for other metabolic disorders: Secondary | ICD-10-CM | POA: Diagnosis not present

## 2022-05-25 LAB — LIPID PANEL
Chol/HDL Ratio: 2.9 ratio (ref 0.0–4.4)
Cholesterol, Total: 217 mg/dL — ABNORMAL HIGH (ref 100–199)
HDL: 74 mg/dL (ref >39)
LDL Chol Calc (NIH): 129 mg/dL — ABNORMAL HIGH (ref 0–99)
Triglycerides: 78 mg/dL (ref 0–149)
VLDL Cholesterol Cal: 14 mg/dL (ref 5–40)

## 2022-05-25 LAB — COMPREHENSIVE METABOLIC PANEL
ALT: 25 IU/L (ref 0–32)
AST: 21 IU/L (ref 0–40)
Albumin/Globulin Ratio: 1.6 (ref 1.2–2.2)
Albumin: 4.2 g/dL (ref 3.8–4.8)
Alkaline Phosphatase: 105 IU/L (ref 44–121)
BUN/Creatinine Ratio: 21 (ref 12–28)
BUN: 16 mg/dL (ref 8–27)
Bilirubin Total: 0.7 mg/dL (ref 0.0–1.2)
CO2: 19 mmol/L — ABNORMAL LOW (ref 20–29)
Calcium: 8.8 mg/dL (ref 8.7–10.3)
Chloride: 104 mmol/L (ref 96–106)
Creatinine, Ser: 0.76 mg/dL (ref 0.57–1.00)
Globulin, Total: 2.6 g/dL (ref 1.5–4.5)
Glucose: 82 mg/dL (ref 70–99)
Potassium: 4.1 mmol/L (ref 3.5–5.2)
Sodium: 141 mmol/L (ref 134–144)
Total Protein: 6.8 g/dL (ref 6.0–8.5)
eGFR: 82 mL/min/{1.73_m2} (ref >59)

## 2022-05-25 LAB — HEMOGLOBIN A1C
Est. average glucose Bld gHb Est-mCnc: 111 mg/dL
Hgb A1c MFr Bld: 5.5 % (ref 4.8–5.6)

## 2022-06-03 ENCOUNTER — Ambulatory Visit
Admission: EM | Admit: 2022-06-03 | Discharge: 2022-06-03 | Disposition: A | Discharge status: Home / Self Care | Payer: Medicare Other | Attending: Internal Medicine | Admitting: Internal Medicine

## 2022-06-03 ENCOUNTER — Encounter: Payer: Self-pay | Admitting: Family Medicine

## 2022-06-03 ENCOUNTER — Ambulatory Visit (INDEPENDENT_AMBULATORY_CARE_PROVIDER_SITE_OTHER): Payer: Medicare Other | Admitting: Family Medicine

## 2022-06-03 ENCOUNTER — Ambulatory Visit (HOSPITAL_BASED_OUTPATIENT_CLINIC_OR_DEPARTMENT_OTHER)
Admission: RE | Admit: 2022-06-03 | Discharge: 2022-06-03 | Disposition: A | Discharge status: Home / Self Care | Payer: Medicare Other | Source: Ambulatory Visit | Attending: Internal Medicine | Admitting: Internal Medicine

## 2022-06-03 VITALS — BP 134/83 | HR 74 | Resp 18 | Ht 63.5 in | Wt 172.0 lb

## 2022-06-03 DIAGNOSIS — E039 Hypothyroidism, unspecified: Secondary | ICD-10-CM | POA: Diagnosis not present

## 2022-06-03 DIAGNOSIS — R7989 Other specified abnormal findings of blood chemistry: Secondary | ICD-10-CM

## 2022-06-03 DIAGNOSIS — Z789 Other specified health status: Secondary | ICD-10-CM | POA: Diagnosis not present

## 2022-06-03 DIAGNOSIS — M79674 Pain in right toe(s): Secondary | ICD-10-CM | POA: Diagnosis not present

## 2022-06-03 DIAGNOSIS — S92511A Displaced fracture of proximal phalanx of right lesser toe(s), initial encounter for closed fracture: Secondary | ICD-10-CM | POA: Diagnosis not present

## 2022-06-03 DIAGNOSIS — X58XXXA Exposure to other specified factors, initial encounter: Secondary | ICD-10-CM | POA: Insufficient documentation

## 2022-06-03 DIAGNOSIS — M7989 Other specified soft tissue disorders: Secondary | ICD-10-CM

## 2022-06-03 DIAGNOSIS — R7303 Prediabetes: Secondary | ICD-10-CM | POA: Insufficient documentation

## 2022-06-03 DIAGNOSIS — Z1159 Encounter for screening for other viral diseases: Secondary | ICD-10-CM | POA: Diagnosis not present

## 2022-06-03 DIAGNOSIS — S92514A Nondisplaced fracture of proximal phalanx of right lesser toe(s), initial encounter for closed fracture: Secondary | ICD-10-CM | POA: Diagnosis not present

## 2022-06-03 DIAGNOSIS — M79671 Pain in right foot: Secondary | ICD-10-CM | POA: Insufficient documentation

## 2022-06-03 DIAGNOSIS — R5383 Other fatigue: Secondary | ICD-10-CM

## 2022-06-03 DIAGNOSIS — F411 Generalized anxiety disorder: Secondary | ICD-10-CM | POA: Diagnosis not present

## 2022-06-03 DIAGNOSIS — E559 Vitamin D deficiency, unspecified: Secondary | ICD-10-CM

## 2022-06-03 NOTE — Discharge Instructions (Addendum)
Please go to [facility] today to have x-ray of foot completed.  I will call if there are any abnormalities.  Postop shoe has been applied for support and stability.  Apply ice and elevate extremity.

## 2022-06-03 NOTE — Assessment & Plan Note (Signed)
Most recent A1c 5.5.  Continue low-carb diet and routine exercise.  Will continue to monitor.

## 2022-06-03 NOTE — ED Triage Notes (Signed)
Pt presents with right toe injuries after hitting a couch X 2 days ago.

## 2022-06-03 NOTE — Assessment & Plan Note (Signed)
Followed by endocrinology.  Most recent thyroid labs within normal limits.  Continue levothyroxine 25 mcg daily.

## 2022-06-03 NOTE — Assessment & Plan Note (Signed)
Rechecking vitamin D today.  Depending on results, continue prescription strength discontinue vitamin D prescription and initiate over-the-counter vitamin D 2000 units daily maintenance dose.

## 2022-06-03 NOTE — Assessment & Plan Note (Signed)
Last lipid panel: LDL 129, HDL 74, triglycerides 78. The 10-year ASCVD risk score (Arnett DK, et al., 2019) is: 17.6%, total cholesterol:HDL ratio still not low enough to be considered low to average risk.

## 2022-06-03 NOTE — Progress Notes (Signed)
Established Patient Office Visit  Subjective   Patient ID: SHELBA ROSO, female    DOB: 1946-02-19  Age: 77 y.o. MRN: [id number]  Chief Complaint  Patient presents with   Medical Management of Chronic Issues   Anxiety   Depression   Hyperlipidemia   Prediabetes    HPI GWENDOLYNN RAAK is a 77 y.o. female presenting today for follow up of mood, hyperlipidemia, prediabetes. Mood: Patient is here to follow up for anxiety, currently managing with Celexa. Taking medication without side effects, reports excellent compliance with treatment. Denies mood changes or SI/HI. she feels mood is stable since last visit. Denies chest pain, difficulty concentrating, dizziness, fatigue, insomnia, irritability, palpitations, panic attacks, racing thoughts, SOB, sweating. Denies anhedonia, depressed mood, difficulty concentrating, fatigue, feelings of worthlessness/guilt, hopelessness, hypersomnia, impaired memory, insomnia, psychomotor agitation, psychomotor retardation, recurrent thoughts of death, weight changes.     Jun 17, 2022    3:50 PM 12/02/2021    3:40 PM 09/14/2021    2:41 PM  Depression screen PHQ 2/9  Decreased Interest 1 0 0  Down, Depressed, Hopeless 1 1 0  PHQ - 2 Score 2 1 0  Altered sleeping 0 0 0  Tired, decreased energy 3 3 0  Change in appetite 0 0 0  Feeling bad or failure about yourself  0 0 0  Trouble concentrating 0 0 0  Moving slowly or fidgety/restless 0 0 0  Suicidal thoughts 0 0 0  PHQ-9 Score 5 4 0  Difficult doing work/chores Somewhat difficult Somewhat difficult Not difficult at all       Jun 17, 2022    3:50 PM 12/02/2021    3:40 PM 09/14/2021    2:42 PM 04/21/2021   10:14 AM  GAD 7 : Generalized Anxiety Score  Nervous, Anxious, on Edge 1 1 0 1  Control/stop worrying 0 0 0 1  Worry too much - different things 1 0 0 1  Trouble relaxing 0 0 0 0  Restless 0 0 0 0  Easily annoyed or irritable 0 0 0 1  Afraid - awful might happen 0 0 0 0  Total GAD 7 Score 2 1 0 4   Anxiety Difficulty Somewhat difficult Not difficult at all Not difficult at all   Hyperlipidemia: Currently managing with diet and exercise alone.  She follows a vegetarian diet. The 10-year ASCVD risk score (Arnett DK, et al., 2019) is: 17.6% Prediabetes: denies hypoglycemic events, wounds or sores that are not healing well, increased thirst or urination.  Patient also noticed a painless "knot" on her left temple.  It is not painful or sore.  She has not noticed that it has increased in size.  Additionally, she jammed her foot into her couch several days ago.  It is still painful, tender, and bruised.  She would like to make sure that it is not broken.  ROS Negative unless otherwise noted in HPI   Objective:     BP 134/83 (BP Location: Left Arm, Patient Position: Sitting, Cuff Size: Normal)   Pulse 74   Resp 18   Ht 5' 3.5" (1.613 m)   Wt 172 lb (78 kg)   SpO2 94%   BMI 29.99 kg/m   Physical Exam   Assessment & Plan:  GAD (generalized anxiety disorder) Assessment & Plan: PHQ-9 score of 5, GAD-7 score of 2.  Stable.  Continue Celexa 10 mg daily.  Will continue to monitor.   Prediabetes Assessment & Plan: Most recent A1c 5.5.  Continue low-carb diet  and routine exercise.  Will continue to monitor.   High serum low-density lipoprotein (LDL) Assessment & Plan: Last lipid panel: LDL 129, HDL 74, triglycerides 78. The 10-year ASCVD risk score (Arnett DK, et al., 2019) is: 17.6%, total cholesterol:HDL ratio still not low enough to be considered low to average risk.    Hypothyroidism, unspecified type Assessment & Plan: Followed by endocrinology.  Most recent thyroid labs within normal limits.  Continue levothyroxine 25 mcg daily.   Vitamin D insufficiency Assessment & Plan: Rechecking vitamin D today.  Depending on results, continue prescription strength discontinue vitamin D prescription and initiate over-the-counter vitamin D 2000 units daily maintenance  dose.   Screening for viral disease -     Hepatitis C antibody; Future  Vegan diet -     Vitamin B12; Future -     Iron and TIBC -     Ferritin  Right foot pain -     DG Foot Complete Right; Future  Mass of soft tissue of face -     Ambulatory referral to Dermatology  Fatigue, unspecified type -     VITAMIN D 25 Hydroxy (Vit-D Deficiency, Fractures); Future -     Vitamin B12; Future -     Iron and TIBC -     Ferritin  Starting workup for fatigue with iron panel, vitamin D, and vitamin B-12.  Most recent thyroid levels within normal limits. Provided referral to dermatology for further evaluation of soft tissue mass on her left temple.  We discussed that if it rapidly grows or becomes painful to let me know.  Return in about 6 months (around 12/03/2022) for Medicare annual wellness visit, fasting blood work 1 week before.    Velva Harman, PA

## 2022-06-03 NOTE — Assessment & Plan Note (Addendum)
PHQ-9 score of 5, GAD-7 score of 2.  Stable.  Continue Celexa 10 mg daily.  Will continue to monitor.

## 2022-06-03 NOTE — ED Provider Notes (Signed)
[name]    CSN: [id number] Arrival date & time: 06/03/22  1631      History   Chief Complaint Chief Complaint  Patient presents with   Toe Injury    HPI Lindsay [name] is a 77 y.o. female.   Patient presents with right foot/toe pain that started a few days prior after an injury.  Patient reports that they were moving a couch and the couch jammed into the lateral portion of the foot at the pinky toe.  She reports that she has minimal pain but has broken the toes in her left foot previously and states that it "feels similar".  She has not taken any medication for pain.  Not reporting any numbness or tingling.  Patient went to her primary care doctor today for routine appointment and an x-ray was ordered.  Although, she reports that she has not been able to see [facility] imaging yet.     Past Medical History:  Diagnosis Date   Arthritis    Dysrhythmia 2014   SVT. ablation done. no problems since   Fibromyalgia    Heart murmur 2019   Hepatitis 1967   in college with mononucleosis   Herpes 1984   Hypothyroidism    Multinodular goiter    Osteopenia    S/P AV nodal ablation 08/05/2011   SVT (supraventricular tachycardia)    Thyroid disease    thyroid nodule being monitored by Dr. Chalmers Cater    Patient Active Problem List   Diagnosis Date Noted   GAD (generalized anxiety disorder) 06/03/2022   Hypothyroid 06/03/2022   Prediabetes 06/03/2022   OAB (overactive bladder) 04/02/2021   Status post left hip replacement 08/29/2020   Unilateral primary osteoarthritis, left hip 08/28/2020   High serum low-density lipoprotein (LDL) 11/30/2018   Vitamin D insufficiency 11/30/2018   High serum high density lipoprotein (HDL) 11/30/2018   Atypical nevus of back 08/07/2017   Reflux esophagitis 08/02/2017   Muscle spasm of lower bilateral back 08/02/2017   Muscle spasms of neck 08/02/2017   Primary osteoarthritis of both knees 10/18/2016   Urinary incontinence  10/08/2016   Generalized osteoarthritis of multiple sites 10/08/2016   Vegan diet 08/27/2016   Thyroid goiter 08/27/2016   h/o chronic Dizziness 08/20/2013   Pain in joint, shoulder region 09/26/2012   Biceps tendinopathy 09/26/2012   Right wrist pain 09/26/2012   S/P AV nodal ablation 08/05/2011   h/o Paroxysmal SVT (supraventricular tachycardia) ([place]) 10/06/2010   Palpitations 08/26/2010   Obesity 08/26/2010    Past Surgical History:  Procedure Laterality Date   BREAST CYST ASPIRATION Bilateral 12/15/2000   CATARACT EXTRACTION W/ INTRAOCULAR LENS IMPLANT Bilateral 2014   paragard iud     8/90   SUPRAVENTRICULAR TACHYCARDIA ABLATION N/A 08/05/2011   Procedure: SUPRAVENTRICULAR TACHYCARDIA ABLATION;  Surgeon: Evans Lance, MD;  Location: [facility] CATH LAB;  Service: Cardiovascular;  Laterality: N/A;   TONSILLECTOMY  1950   TOTAL HIP ARTHROPLASTY Left 08/29/2020   Procedure: LEFT TOTAL HIP ARTHROPLASTY ANTERIOR APPROACH;  Surgeon: Mcarthur Rossetti, MD;  Location: [facility] ORS;  Service: Orthopedics;  Laterality: Left;    OB History     Gravida  4   Para  2   Term      Preterm      AB  2   Living  2      SAB  1   IAB  1   Ectopic      Multiple      Live  Births               Home Medications    Prior to Admission medications   Medication Sig Start Date End Date Taking? Authorizing Provider  Ascorbic Acid (VITAMIN C) 1000 MG tablet Take 1,000 mg by mouth at bedtime.    [provider]  citalopram (CELEXA) 10 MG tablet Take 1 tablet (10 mg total) by mouth daily. 01/11/22   Abonza, Maritza, PA-C  ELDERBERRY PO Take 5 mLs by mouth at bedtime.    [provider]  folic acid (FOLVITE) [id number] MCG tablet Take 800 mcg by mouth daily.    [provider]  levothyroxine (SYNTHROID, LEVOTHROID) 25 MCG tablet Take 25 mcg by mouth daily. 12/10/16   [provider]  Multiple Vitamin (MULTIVITAMIN) tablet Take 1 tablet by mouth daily.     [provider]  Multiple Vitamins-Minerals (ZINC PO) Take 1 tablet by mouth daily.    [provider]  Probiotic Product (PROBIOTIC FORMULA PO) Take 1 capsule by mouth daily. New chapter All-Flora.    [provider]  Propylene Glycol (SYSTANE COMPLETE OP) Place 1 drop into both eyes daily.    [provider]  sodium fluoride (FLUORISHIELD) 1.1 % GEL dental gel Place 1 application onto teeth at bedtime. 07/23/19   [provider]  TURMERIC PO Take 1 tablet by mouth daily.    [provider]  Vitamin D, Ergocalciferol, (DRISDOL) 1.25 MG (50000 UNIT) CAPS capsule TAKE 1 CAPSULE BY MOUTH WEEKLY 01/18/22   Ronnell Freshwater, NP    Family History Family History  Problem Relation Age of Onset   Dementia Mother 34       alive   Transient ischemic attack Mother        hx of; has a pacemaker   Hypothyroidism Mother    Other Father 12       old age. Develop CHF the last 4 months   Other Sister 67       died of thymoma   Hypothyroidism Sister    Other Maternal Aunt        nasal tumor   Heart attack Maternal Grandfather    Hypothyroidism Maternal Aunt    Stroke Paternal Grandfather    Colon cancer Neg Hx    Esophageal cancer Neg Hx    Pancreatic cancer Neg Hx    Prostate cancer Neg Hx    Rectal cancer Neg Hx    Stomach cancer Neg Hx     Social History Social History   Tobacco Use   Smoking status: Former    Packs/day: 2.00    Years: 2.00    Additional pack years: 0.00    Total pack years: 4.00    Types: Cigarettes    Start date: 03/01/1964    Quit date: 03/01/1970    Years since quitting: 52.2    Passive exposure: Never   Smokeless tobacco: Never  Vaping Use   Vaping Use: Never used  Substance Use Topics   Alcohol use: Yes    Alcohol/week: 0.0 standard drinks of alcohol    Comment: occasional   Drug use: No     Allergies   Nickel   Review of Systems Review of Systems Per HPI  Physical Exam Triage Vital  Signs ED Triage Vitals [06/03/22 1641]  Enc Vitals Group     BP 128/84     Pulse Rate 71     Resp 18     Temp (!) 97.4  F (36.3 C)     Temp Source Oral     SpO2 97 %     Weight      Height      Head Circumference      Peak Flow      Pain Score 4     Pain Loc      Pain Edu?      Excl. in [place]?    No data found.  Updated Vital Signs BP 128/84 (BP Location: Left Arm)   Pulse 71   Temp (!) 97.4 F (36.3 C) (Oral)   Resp 18   SpO2 97%   Visual Acuity Right Eye Distance:   Left Eye Distance:   Bilateral Distance:    Right Eye Near:   Left Eye Near:    Bilateral Near:     Physical Exam Constitutional:      General: She is not in acute distress.    Appearance: Normal appearance. She is not toxic-appearing or diaphoretic.  HENT:     Head: Normocephalic and atraumatic.  Eyes:     Extraocular Movements: Extraocular movements intact.     Conjunctiva/sclera: Conjunctivae normal.  Pulmonary:     Effort: Pulmonary effort is normal.  Feet:     Comments: Patient has mild swelling and bruising discoloration present at the dorsal surface of the right foot directly underlying proximal portion of the third through fifth toes that slightly extends into the proximal portion of the toes.  Patient also has swelling and mild erythema present throughout the right fifth toe that extends into the fifth metatarsal.  Capillary refill and pulses are intact.  Patient can wiggle toes.  Patient is neurovascularly intact.  All nails are intact.  No lacerations or abrasions noted. Neurological:     General: No focal deficit present.     Mental Status: She is alert and oriented to person, place, and time. Mental status is at baseline.  Psychiatric:        Mood and Affect: Mood normal.        Behavior: Behavior normal.        Thought Content: Thought content normal.        Judgment: Judgment normal.      UC Treatments / Results  Labs (all labs ordered are listed, but only abnormal results are  displayed) Labs Reviewed - No data to display  EKG   Radiology No results found.  Procedures Procedures (including critical care time)  Medications Ordered in UC Medications - No data to display  Initial Impression / Assessment and Plan / UC Course  I have reviewed the triage vital signs and the nursing notes.  Pertinent labs & imaging results that were available during my care of the patient were reviewed by me and considered in my medical decision making (see chart for details).     Do not have x-ray capabilities here in urgent care at this time.  Therefore, outpatient imaging was ordered at [facility] for patient to have completed today.  Awaiting result.  Postop shoe applied for comfort and stability.  Advised ice application and elevation of extremity.  Advised safe over-the-counter pain relievers.   Advised strict return precautions.  Patient verbalized understanding and was agreeable with plan. Final Clinical Impressions(s) / UC Diagnoses   Final diagnoses:  Right foot pain     Discharge Instructions      Please go to [facility] today to have x-ray of foot completed.  I will call  if there are any abnormalities.  Postop shoe has been applied for support and stability.  Apply ice and elevate extremity.     ED Prescriptions   None    PDMP not reviewed this encounter.   Teodora Medici, [place] 06/03/22 [phone]

## 2022-06-04 LAB — VITAMIN B12: Vitamin B-12: 1088 pg/mL (ref 232–1245)

## 2022-06-04 LAB — IRON AND TIBC
Iron Saturation: 14 % — ABNORMAL LOW (ref 15–55)
Iron: 47 ug/dL (ref 27–139)
Total Iron Binding Capacity: 344 ug/dL (ref 250–450)
UIBC: 297 ug/dL (ref 118–369)

## 2022-06-04 LAB — HEPATITIS C ANTIBODY: Hep C Virus Ab: NONREACTIVE

## 2022-06-04 LAB — FERRITIN: Ferritin: 31 ng/mL (ref 15–150)

## 2022-06-04 LAB — VITAMIN D 25 HYDROXY (VIT D DEFICIENCY, FRACTURES): Vit D, 25-Hydroxy: 42.5 ng/mL (ref 30.0–100.0)

## 2022-06-07 ENCOUNTER — Ambulatory Visit
Admission: RE | Admit: 2022-06-07 | Discharge: 2022-06-07 | Disposition: A | Discharge status: Home / Self Care | Payer: Medicare Other | Source: Ambulatory Visit | Attending: Family Medicine | Admitting: Family Medicine

## 2022-06-07 DIAGNOSIS — Z1231 Encounter for screening mammogram for malignant neoplasm of breast: Secondary | ICD-10-CM | POA: Diagnosis not present

## 2022-06-17 ENCOUNTER — Ambulatory Visit (HOSPITAL_BASED_OUTPATIENT_CLINIC_OR_DEPARTMENT_OTHER): Payer: Medicare Other | Admitting: Student

## 2022-06-17 ENCOUNTER — Ambulatory Visit (INDEPENDENT_AMBULATORY_CARE_PROVIDER_SITE_OTHER): Payer: Medicare Other

## 2022-06-17 ENCOUNTER — Other Ambulatory Visit (HOSPITAL_BASED_OUTPATIENT_CLINIC_OR_DEPARTMENT_OTHER): Payer: Self-pay | Admitting: Student

## 2022-06-17 DIAGNOSIS — G8929 Other chronic pain: Secondary | ICD-10-CM

## 2022-06-17 DIAGNOSIS — M25561 Pain in right knee: Secondary | ICD-10-CM

## 2022-06-17 NOTE — Progress Notes (Signed)
Chief Complaint: Right knee pain     History of Present Illness:    Lindsay [name] is a 77 y.o. female presenting for evaluation of right knee pain.  She states that yesterday afternoon she lost her balance and fell forward onto the ground.  She reports being in a thick grassy area at that time however her right knee took a lot of the impact.  She is currently in a postop shoe after a recent fracture to her right fifth toe and states that this has caused her to lose her balance a few times.  Overall she notes some improvement since yesterday as she does not have as much pain while walking.  Pain levels are mild-moderate. The swelling has increased some since yesterday however.  Denies any radiating pain, numbness, or tingling.   Surgical History:   Left THA 2022  PMH/PSH/Family History/Social History/Meds/Allergies:    Past Medical History:  Diagnosis Date   Arthritis    Dysrhythmia 2014   SVT. ablation done. no problems since   Fibromyalgia    Heart murmur 2019   Hepatitis 1967   in college with mononucleosis   Herpes 1984   Hypothyroidism    Multinodular goiter    Osteopenia    S/P AV nodal ablation 08/05/2011   SVT (supraventricular tachycardia)    Thyroid disease    thyroid nodule being monitored by Dr. Talmage Nap   Past Surgical History:  Procedure Laterality Date   BREAST CYST ASPIRATION Bilateral 12/15/2000   CATARACT EXTRACTION W/ INTRAOCULAR LENS IMPLANT Bilateral 2014   paragard iud     8/90   SUPRAVENTRICULAR TACHYCARDIA ABLATION N/A 08/05/2011   Procedure: SUPRAVENTRICULAR TACHYCARDIA ABLATION;  Surgeon: Marinus Maw, MD;  Location[facility] CATH LAB;  Service: Cardiovascular;  Laterality: N/A;   TONSILLECTOMY  1950   TOTAL HIP ARTHROPLASTY Left 08/29/2020   Procedure: LEFT TOTAL HIP ARTHROPLASTY ANTERIOR APPROACH;  Surgeon: Kathryne Hitch, MD;  Location: [facility] ORS;  Service: Orthopedics;  Laterality: Left;   Social History    Socioeconomic History   Marital status: Divorced    Spouse name: Not on file   Number of children: 2   Years of education: Not on file   Highest education level: Not on file  Occupational History   Not on file  Tobacco Use   Smoking status: Former    Packs/day: 2.00    Years: 2.00    Additional pack years: 0.00    Total pack years: 4.00    Types: Cigarettes    Start date: 03/01/1964    Quit date: 03/01/1970    Years since quitting: 52.3    Passive exposure: Never   Smokeless tobacco: Never  Vaping Use   Vaping Use: Never used  Substance and Sexual Activity   Alcohol use: Yes    Alcohol/week: 0.0 standard drinks of alcohol    Comment: occasional   Drug use: No   Sexual activity: Never    Birth control/protection: Post-menopausal  Other Topics Concern   Not on file  Social History Narrative   Not on file   Social Determinants of Health   Financial Resource Strain: Not on file  Food Insecurity: Not on file  Transportation Needs: Not on file  Physical Activity: Not on file  Stress: Not on file  Social Connections: Not  on file   Family History  Problem Relation Age of Onset   Dementia Mother 86       alive   Transient ischemic attack Mother        hx of; has a pacemaker   Hypothyroidism Mother    Other Father 9       old age. Develop CHF the last 4 months   Other Sister 20       died of thymoma   Hypothyroidism Sister    Other Maternal Aunt        nasal tumor   Heart attack Maternal Grandfather    Hypothyroidism Maternal Aunt    Stroke Paternal Grandfather    Colon cancer Neg Hx    Esophageal cancer Neg Hx    Pancreatic cancer Neg Hx    Prostate cancer Neg Hx    Rectal cancer Neg Hx    Stomach cancer Neg Hx    Allergies  Allergen Reactions   Nickel Rash   Current Outpatient Medications  Medication Sig Dispense Refill   Ascorbic Acid (VITAMIN C) 1000 MG tablet Take 1,000 mg by mouth at bedtime.     citalopram (CELEXA) 10 MG tablet Take 1 tablet (10  mg total) by mouth daily. 30 tablet 3   ELDERBERRY PO Take 5 mLs by mouth at bedtime.     folic acid (FOLVITE) 800 MCG tablet Take 800 mcg by mouth daily.     levothyroxine (SYNTHROID, LEVOTHROID) 25 MCG tablet Take 25 mcg by mouth daily.  11   Multiple Vitamin (MULTIVITAMIN) tablet Take 1 tablet by mouth daily.     Multiple Vitamins-Minerals (ZINC PO) Take 1 tablet by mouth daily.     Probiotic Product (PROBIOTIC FORMULA PO) Take 1 capsule by mouth daily. New chapter All-Flora.     Propylene Glycol (SYSTANE COMPLETE OP) Place 1 drop into both eyes daily.     sodium fluoride (FLUORISHIELD) 1.1 % GEL dental gel Place 1 application onto teeth at bedtime.     TURMERIC PO Take 1 tablet by mouth daily.     Vitamin D, Ergocalciferol, (DRISDOL) 1.25 MG (50000 UNIT) CAPS capsule TAKE 1 CAPSULE BY MOUTH WEEKLY 12 capsule 0   Current Facility-Administered Medications  Medication Dose Route Frequency Provider Last Rate Last Admin   bupivacaine (MARCAINE) 0.5 % injection 2 mL  2 mL Infiltration Once Opalski, Deborah, DO       lidocaine (XYLOCAINE) 2 % (with pres) injection 20 mg  1 mL Other Once Opalski, Deborah, DO       methylPREDNISolone acetate (DEPO-MEDROL) injection 40 mg  40 mg Intra-articular Once Opalski, Deborah, DO       No results found.  Review of Systems:   A ROS was performed including pertinent positives and negatives as documented in the HPI.  Physical Exam :   Constitutional: NAD and appears stated age Neurological: Alert and oriented Psych: Appropriate affect and cooperative There were no vitals taken for this visit.   Comprehensive Musculoskeletal Exam:    There is significant soft tissue edema of the left knee with ecchymosis noted more laterally and a small abrasion that is well-appearing over the anterolateral knee.  No tenderness over joint lines or patella.  Active range of motion from 0 to 100 degrees.  No laxity noted with varus or valgus stress.  Negative Lachman's.   Distal neurosensory exam intact.  Imaging:   Xray (Right knee 4 views): No evidence of fracture or dislocation.  Mild patellofemoral degenerative changes.  Soft tissue edema noted.   I personally reviewed and interpreted the radiographs.   Assessment:   77 y.o. female presenting with right knee pain after a fall she sustained yesterday.  Her knee is significantly swollen and bruised however x-rays are negative for fracture.  This was most likely a contusion and have low suspicion for ligamentous injury however unable to rule this out without further workup.  Encourage patient to continue icing as well as Advil and Tylenol for pain relief.  She does have a follow-up with Dr. Magnus Ivan in 2 weeks for her toe fracture, and recommend having him recheck the knee at that point once inflammation has hopefully gone down.  All other questions and concerns addressed.  Plan :    -Return to clinic for follow-up as needed     I personally saw and evaluated the patient, and participated in the management and treatment plan.  Hazle Nordmann, PA-C Orthopedics  This document was dictated using Conservation officer, historic buildings. A reasonable attempt at proof reading has been made to minimize errors.

## 2022-06-28 ENCOUNTER — Other Ambulatory Visit: Payer: Self-pay

## 2022-06-28 ENCOUNTER — Encounter: Payer: Self-pay | Admitting: Orthopaedic Surgery

## 2022-06-28 ENCOUNTER — Ambulatory Visit: Payer: Medicare Other | Admitting: Orthopaedic Surgery

## 2022-06-28 ENCOUNTER — Other Ambulatory Visit (INDEPENDENT_AMBULATORY_CARE_PROVIDER_SITE_OTHER): Payer: Medicare Other

## 2022-06-28 DIAGNOSIS — M25562 Pain in left knee: Secondary | ICD-10-CM | POA: Diagnosis not present

## 2022-06-28 DIAGNOSIS — G8929 Other chronic pain: Secondary | ICD-10-CM

## 2022-06-28 DIAGNOSIS — M79671 Pain in right foot: Secondary | ICD-10-CM

## 2022-06-28 DIAGNOSIS — M25561 Pain in right knee: Secondary | ICD-10-CM | POA: Diagnosis not present

## 2022-06-28 NOTE — Progress Notes (Signed)
The patient is someone that I have seen in the past and replaced her left hip several years ago.  She has had no issues with that hip.  She is 77 years old and did have a mechanical fall earlier in April when she injured her right fifth toe and sustained a fracture of the proximal phalanx.  She has been ambulating with a postop shoe.  She did not have another fall injuring her right knee and developed significant bruising around her knee and all the way down her right leg.  That is slowly getting better.  She has been able to put weight and x-rays of the right knee reviewed and showed mainly just patellofemoral arthritis.  She says that she does have problems going up and down stairs with chronic pain in both her knees but also weakness.  Examination right foot shows just some slight tenderness around the fifth proximal phalanx.  This is only slight.  X-rays of the foot show the fracture is in good alignment and healing when compared to previous films.  I did review x-rays of her right knee that shows well-maintained medial and lateral joint lines with some evidence of patellofemoral arthritic changes.  There is neutral alignment.  On examination of her right knee there is significant bruising and swelling and a mild effusion.  Her extensor mechanism is intact and she has good range of motion of that knee and it is ligamentously stable.  Her left knee also has some patellofemoral crepitation.  I would actually like to send her to physical therapy for her knees in terms of any modalities and exercises that can help strengthen her quads and other muscles for going up and down stairs more easily.  She may be a candidate at some point for considering hyaluronic acid.  I would like to see her back in 6 weeks after course of outpatient physical therapy to strengthen her knees.  She agrees with this treatment plan.  She can transition back to regular shoe as well for her right foot.

## 2022-07-12 ENCOUNTER — Ambulatory Visit: Payer: Medicare Other | Admitting: Physical Therapy

## 2022-07-28 ENCOUNTER — Encounter: Payer: Self-pay | Admitting: Family Medicine

## 2022-07-28 ENCOUNTER — Ambulatory Visit (INDEPENDENT_AMBULATORY_CARE_PROVIDER_SITE_OTHER): Payer: Medicare Other | Admitting: Family Medicine

## 2022-07-28 VITALS — BP 111/74 | HR 70 | Resp 18 | Ht 63.5 in | Wt 170.0 lb

## 2022-07-28 DIAGNOSIS — R5383 Other fatigue: Secondary | ICD-10-CM

## 2022-07-28 DIAGNOSIS — E559 Vitamin D deficiency, unspecified: Secondary | ICD-10-CM | POA: Diagnosis not present

## 2022-07-28 DIAGNOSIS — E611 Iron deficiency: Secondary | ICD-10-CM | POA: Diagnosis not present

## 2022-07-28 MED ORDER — VITAMIN D (ERGOCALCIFEROL) 1.25 MG (50000 UNIT) PO CAPS
50000.0000 [IU] | ORAL_CAPSULE | ORAL | 1 refills | Status: AC
Start: 2022-07-28 — End: ?

## 2022-07-28 NOTE — Assessment & Plan Note (Signed)
Continue prescription strength weekly vitamin D supplement until next appointment in about 6 months.  If well-maintained, continue prescription strength or switch to over-the-counter vitamin D3 5000 units daily.  Will continue to monitor.

## 2022-07-28 NOTE — Progress Notes (Signed)
   Established Patient Office Visit  Subjective   Patient ID: Lindsay [name], female    DOB: [birth year]  Age: 77 y.o. MRN: [id number]  Chief Complaint  Patient presents with   Fatigue    HPI Lindsay [name] is a 77 y.o. female presenting today for follow up of fatigue.  This is an ongoing issue for her and has been for quite some time.  At last visit, CMP was essentially within normal limits, CBC showed no abnormalities, vitamin D and B12 within normal limits.  Iron saturation was slightly low.  She does have continued fatigue, feeling sleepy throughout the day.  She would like to discuss starting a prescription strength vitamin D supplement but again because that seemed to be helpful to keep her vitamin D level in the mid to high side of normal and improved her fatigue.  ROS Negative unless otherwise noted in HPI   Objective:     BP 111/74 (BP Location: Right Arm, Patient Position: Sitting, Cuff Size: Large)   Pulse 70   Resp 18   Ht 5' 3.5" (1.613 m)   Wt 170 lb (77.1 kg)   SpO2 96%   BMI 29.64 kg/m   Physical Exam Constitutional:      General: She is not in acute distress.    Appearance: Normal appearance.  HENT:     Head: Normocephalic and atraumatic.  Cardiovascular:     Rate and Rhythm: Normal rate and regular rhythm.     Heart sounds: No murmur heard.    No friction rub. No gallop.  Pulmonary:     Effort: Pulmonary effort is normal. No respiratory distress.     Breath sounds: No wheezing, rhonchi or rales.  Skin:    General: Skin is warm and dry.  Neurological:     Mental Status: She is alert and oriented to person, place, and time.     Assessment & Plan:  Fatigue, unspecified type  Vitamin D insufficiency Assessment & Plan: Continue prescription strength weekly vitamin D supplement until next appointment in about 6 months.  If well-maintained, continue prescription strength or switch to over-the-counter vitamin D3 5000 units daily.  Will continue to  monitor.  Orders: -     Vitamin D (Ergocalciferol); Take 1 capsule (50,000 Units total) by mouth once a week.  Dispense: 12 capsule; Refill: 1  Low iron  Lab workup within normal limits thus far for fatigue.  Trial of vitamin D prescription strength supplement and adding over-the-counter iron supplement.  Will evaluate at next appointment in about 6 months.  Return if symptoms worsen or fail to improve.    Melida Quitter, PA

## 2022-07-28 NOTE — Patient Instructions (Addendum)
As I was looking back at your labs, the only one that was just slightly low that could play a role in your fatigue was your iron.  It would not hurt to supplement it for the next few months until I see you again.  We can recheck your iron levels at that time.  Low Iron: Either ferrous sulfate, ferrous gluconate, or ferrous fumarate. The recommendation is 100 mg twice a day.  These can all easily be found over-the-counter.  This can be constipating, so if need to, you can reduce to 100 mg once a day.  You can also add a daily stool softener if needed.  About 10% of people will have difficulty with iron and it will cause GI upset.  So one option is to decrease the dose to 50 mg daily, or even take it every other day.  Taking a liquid formulation with food can also cause less GI symptoms.  There are also some vegan options on the market that are made from plant iron instead of meat derived iron and these are often better tolerated.  Also work on eating iron rich foods like salmon, green leafy vegetable, tofu, beef or chicken liver, broccoli, dried apricots, red kidney beans, chickpeas, and some breakfast cereals.   Your referral to dermatology is currently in their queue, so they will reach out once they get a chance to!

## 2022-08-09 ENCOUNTER — Ambulatory Visit: Payer: Medicare Other | Admitting: Orthopaedic Surgery

## 2022-10-26 ENCOUNTER — Telehealth: Payer: Self-pay | Admitting: *Deleted

## 2022-10-26 NOTE — Telephone Encounter (Signed)
LVM for pt to call office, wanted to let her know that we are changing her appointment on 12/06/22 from AWV to office visit with provider this day and will rescheduled AWV with phone team to go over the Medicare Questions.  This way she will meet with provider to go over her labs and see how things are going that day. Please assist her in getting the phone visit scheduled and change the 10/7 visit to office visit.

## 2022-11-25 ENCOUNTER — Other Ambulatory Visit: Payer: Self-pay

## 2022-11-25 DIAGNOSIS — Z0000 Encounter for general adult medical examination without abnormal findings: Secondary | ICD-10-CM

## 2022-11-29 ENCOUNTER — Other Ambulatory Visit: Payer: Self-pay | Admitting: Family Medicine

## 2022-11-29 DIAGNOSIS — E559 Vitamin D deficiency, unspecified: Secondary | ICD-10-CM

## 2022-11-30 ENCOUNTER — Other Ambulatory Visit: Payer: Medicare Other

## 2022-11-30 DIAGNOSIS — Z0000 Encounter for general adult medical examination without abnormal findings: Secondary | ICD-10-CM | POA: Diagnosis not present

## 2022-12-01 LAB — COMPREHENSIVE METABOLIC PANEL
ALT: 25 [IU]/L (ref 0–32)
AST: 21 [IU]/L (ref 0–40)
Albumin: 4.3 g/dL (ref 3.8–4.8)
Alkaline Phosphatase: 88 [IU]/L (ref 44–121)
BUN/Creatinine Ratio: 19 (ref 12–28)
BUN: 14 mg/dL (ref 8–27)
Bilirubin Total: 0.6 mg/dL (ref 0.0–1.2)
CO2: 23 mmol/L (ref 20–29)
Calcium: 9.2 mg/dL (ref 8.7–10.3)
Chloride: 104 mmol/L (ref 96–106)
Creatinine, Ser: 0.75 mg/dL (ref 0.57–1.00)
Globulin, Total: 2 g/dL (ref 1.5–4.5)
Glucose: 91 mg/dL (ref 70–99)
Potassium: 4.2 mmol/L (ref 3.5–5.2)
Sodium: 140 mmol/L (ref 134–144)
Total Protein: 6.3 g/dL (ref 6.0–8.5)
eGFR: 82 mL/min/{1.73_m2} (ref >59)

## 2022-12-01 LAB — CBC WITH DIFFERENTIAL/PLATELET
Basophils Absolute: 0 10*3/uL (ref 0.0–0.2)
Basos: 1 %
EOS (ABSOLUTE): 0.3 10*3/uL (ref 0.0–0.4)
Eos: 5 %
Hematocrit: 38.8 % (ref 34.0–46.6)
Hemoglobin: 12.5 g/dL (ref 11.1–15.9)
Immature Grans (Abs): 0 10*3/uL (ref 0.0–0.1)
Immature Granulocytes: 0 %
Lymphocytes Absolute: 1.7 10*3/uL (ref 0.7–3.1)
Lymphs: 29 %
MCH: 30.7 pg (ref 26.6–33.0)
MCHC: 32.2 g/dL (ref 31.5–35.7)
MCV: 95 fL (ref 79–97)
Monocytes Absolute: 0.7 10*3/uL (ref 0.1–0.9)
Monocytes: 11 %
Neutrophils Absolute: 3.3 10*3/uL (ref 1.4–7.0)
Neutrophils: 54 %
Platelets: 271 10*3/uL (ref 150–450)
RBC: 4.07 x10E6/uL (ref 3.77–5.28)
RDW: 12.8 % (ref 11.7–15.4)
WBC: 6 10*3/uL (ref 3.4–10.8)

## 2022-12-01 LAB — LIPID PANEL
Chol/HDL Ratio: 3.7 {ratio} (ref 0.0–4.4)
Cholesterol, Total: 223 mg/dL — ABNORMAL HIGH (ref 100–199)
HDL: 61 mg/dL (ref >39)
LDL Chol Calc (NIH): 145 mg/dL — ABNORMAL HIGH (ref 0–99)
Triglycerides: 99 mg/dL (ref 0–149)
VLDL Cholesterol Cal: 17 mg/dL (ref 5–40)

## 2022-12-01 LAB — TSH: TSH: 1.62 u[IU]/mL (ref 0.450–4.500)

## 2022-12-01 LAB — HEMOGLOBIN A1C
Est. average glucose Bld gHb Est-mCnc: 114 mg/dL
Hgb A1c MFr Bld: 5.6 % (ref 4.8–5.6)

## 2022-12-06 ENCOUNTER — Ambulatory Visit (INDEPENDENT_AMBULATORY_CARE_PROVIDER_SITE_OTHER): Payer: Medicare Other | Admitting: Family Medicine

## 2022-12-06 ENCOUNTER — Encounter: Payer: Self-pay | Admitting: Family Medicine

## 2022-12-06 VITALS — BP 138/81 | HR 82 | Resp 18 | Ht 63.5 in | Wt 177.0 lb

## 2022-12-06 DIAGNOSIS — R7989 Other specified abnormal findings of blood chemistry: Secondary | ICD-10-CM

## 2022-12-06 DIAGNOSIS — E559 Vitamin D deficiency, unspecified: Secondary | ICD-10-CM

## 2022-12-06 DIAGNOSIS — E039 Hypothyroidism, unspecified: Secondary | ICD-10-CM

## 2022-12-06 DIAGNOSIS — R7303 Prediabetes: Secondary | ICD-10-CM | POA: Diagnosis not present

## 2022-12-06 DIAGNOSIS — F411 Generalized anxiety disorder: Secondary | ICD-10-CM

## 2022-12-06 MED ORDER — VITAMIN D (ERGOCALCIFEROL) 50000 UNITS PO CAPS: 1.0000 | ORAL_CAPSULE | ORAL | 2 refills | Status: DC

## 2022-12-06 MED ORDER — CITALOPRAM HYDROBROMIDE 10 MG PO TABS: 10.0000 mg | ORAL_TABLET | Freq: Every day | ORAL | 3 refills | Status: DC

## 2022-12-06 NOTE — Progress Notes (Signed)
Established Patient Office Visit  Subjective   Patient ID: Lindsay [name], female    DOB: 1945/08/07  Age: 77 y.o. MRN: [id number]  Chief Complaint  Patient presents with   Anxiety    HPI Lindsay [name] is a 77 y.o. female presenting today for follow up of mood. Mood: Patient is here to follow up for anxiety, currently managing with Celexa 10 mg daily. Taking medication without side effects, reports excellent compliance with treatment. Denies mood changes or SI/HI. She feels mood is stable since last visit. Denies chest pain, difficulty concentrating, dizziness, fatigue, insomnia, irritability, palpitations, panic attacks, racing thoughts, SOB, sweating. Denies anhedonia, depressed mood, difficulty concentrating, fatigue, feelings of worthlessness/guilt, hopelessness, hypersomnia, impaired memory, insomnia, psychomotor agitation, psychomotor retardation, recurrent thoughts of death, weight changes.     2022-12-30    2:57 PM 07/28/2022   11:08 AM 06/03/2022    3:50 PM  Depression screen PHQ 2/9  Decreased Interest 0 0 1  Down, Depressed, Hopeless 0 0 1  PHQ - 2 Score 0 0 2  Altered sleeping 2 0 0  Tired, decreased energy 2 1 3   Change in appetite 0 0 0  Feeling bad or failure about yourself  0 0 0  Trouble concentrating 0 0 0  Moving slowly or fidgety/restless 0 0 0  Suicidal thoughts 0 0 0  PHQ-9 Score 4 1 5   Difficult doing work/chores Somewhat difficult Somewhat difficult Somewhat difficult       Dec 30, 2022    2:57 PM 06/03/2022    3:50 PM 12/02/2021    3:40 PM 09/14/2021    2:42 PM  GAD 7 : Generalized Anxiety Score  Nervous, Anxious, on Edge 1 1 1  0  Control/stop worrying 0 0 0 0  Worry too much - different things 0 1 0 0  Trouble relaxing 0 0 0 0  Restless 0 0 0 0  Easily annoyed or irritable 0 0 0 0  Afraid - awful might happen 0 0 0 0  Total GAD 7 Score 1 2 1  0  Anxiety Difficulty Somewhat difficult Somewhat difficult Not difficult at all Not difficult at all     Outpatient Medications Prior to Visit  Medication Sig   Ascorbic Acid (VITAMIN C) 1000 MG tablet Take 1,000 mg by mouth at bedtime.   aspirin EC 81 MG tablet Take 81 mg by mouth daily. Swallow whole.   ELDERBERRY PO Take 5 mLs by mouth at bedtime.   levothyroxine (SYNTHROID, LEVOTHROID) 25 MCG tablet Take 25 mcg by mouth daily.   Multiple Vitamins-Minerals (ZINC PO) Take 1 tablet by mouth daily.   Probiotic Product (PROBIOTIC FORMULA PO) Take 1 capsule by mouth daily. New chapter All-Flora.   Propylene Glycol (SYSTANE COMPLETE OP) Place 1 drop into both eyes daily.   sodium fluoride (FLUORISHIELD) 1.1 % GEL dental gel Place 1 application onto teeth at bedtime.   TURMERIC PO Take 1 tablet by mouth daily.   [DISCONTINUED] citalopram (CELEXA) 10 MG tablet Take 1 tablet (10 mg total) by mouth daily.   [DISCONTINUED] Vitamin D, Ergocalciferol, 50000 units CAPS TAKE 1 CAPSULE BY MOUTH ONCE A WEEK   Facility-Administered Medications Prior to Visit  Medication Dose Route Frequency Provider   bupivacaine (MARCAINE) 0.5 % injection 2 mL  2 mL Infiltration Once Opalski, Deborah, DO   lidocaine (XYLOCAINE) 2 % (with pres) injection 20 mg  1 mL Other Once Opalski, Deborah, DO   methylPREDNISolone acetate (DEPO-MEDROL) injection 40 mg  40 mg  Intra-articular Once Opalski, Deborah, DO    ROS Negative unless otherwise noted in HPI   Objective:     BP 138/81 (BP Location: Left Arm, Patient Position: Sitting, Cuff Size: Normal)   Pulse 82   Resp 18   Ht 5' 3.5" (1.613 m)   Wt 177 lb (80.3 kg)   SpO2 98%   BMI 30.86 kg/m   Physical Exam Constitutional:      General: She is not in acute distress.    Appearance: Normal appearance.  HENT:     Head: Normocephalic and atraumatic.  Cardiovascular:     Rate and Rhythm: Normal rate and regular rhythm.     Heart sounds: No murmur heard.    No friction rub. No gallop.  Pulmonary:     Effort: Pulmonary effort is normal. No respiratory distress.      Breath sounds: No wheezing, rhonchi or rales.  Skin:    General: Skin is warm and dry.  Neurological:     Mental Status: She is alert and oriented to person, place, and time.      Assessment & Plan:  GAD (generalized anxiety disorder) Assessment & Plan: PHQ-9 score 4, GAD-7 score 1.  Stable.  Continue Celexa 10 mg daily.  Will continue to monitor.  Orders: -     Citalopram Hydrobromide; Take 1 tablet (10 mg total) by mouth daily.  Dispense: 30 tablet; Refill: 3  Hypothyroidism, unspecified type Assessment & Plan: Followed by endocrinology.  Most recent thyroid labs within normal limits.  Continue levothyroxine 25 mcg daily.   Vitamin D insufficiency Assessment & Plan: Continue prescription strength weekly vitamin D supplement until next appointment in about 6 months.  If well-maintained, continue prescription strength or switch to over-the-counter vitamin D3 5000 units daily.  Will continue to monitor.  Orders: -     Vitamin D (Ergocalciferol); Take 1 capsule by mouth once a week.  Dispense: 12 capsule; Refill: 2  Prediabetes Assessment & Plan: Most recent A1c 5.6.  Continue low-carb diet and routine exercise.  Will continue to monitor.   High serum low-density lipoprotein (LDL) Assessment & Plan: Last lipid panel: LDL 145, HDL 61, triglycerides 99. The 10-year ASCVD risk score (Arnett DK, et al., 2019) is: 20.5%, total cholesterol:HDL ratio still not low enough to be considered low to average risk.  Recheck in 6 months, at that time may discuss medication again.    LDL increased to 145, otherwise within normal limits.  Discussed results.  Return in about 6 months (around 06/06/2023) for annual physical, fasting blood work 1 week before.    Melida Quitter, PA

## 2022-12-06 NOTE — Assessment & Plan Note (Signed)
Most recent A1c 5.6.  Continue low-carb diet and routine exercise.  Will continue to monitor.

## 2022-12-06 NOTE — Assessment & Plan Note (Signed)
Continue prescription strength weekly vitamin D supplement until next appointment in about 6 months.  If well-maintained, continue prescription strength or switch to over-the-counter vitamin D3 5000 units daily.  Will continue to monitor.

## 2022-12-06 NOTE — Assessment & Plan Note (Signed)
Followed by endocrinology.  Most recent thyroid labs within normal limits.  Continue levothyroxine 25 mcg daily.

## 2022-12-06 NOTE — Assessment & Plan Note (Signed)
Last lipid panel: LDL 145, HDL 61, triglycerides 99. The 10-year ASCVD risk score (Arnett DK, et al., 2019) is: 20.5%, total cholesterol:HDL ratio still not low enough to be considered low to average risk.  Recheck in 6 months, at that time may discuss medication again.

## 2022-12-06 NOTE — Assessment & Plan Note (Signed)
PHQ-9 score 4, GAD-7 score 1.  Stable.  Continue Celexa 10 mg daily.  Will continue to monitor.

## 2022-12-09 ENCOUNTER — Ambulatory Visit: Payer: Medicare Other

## 2022-12-09 VITALS — Ht 63.5 in | Wt 177.0 lb

## 2022-12-09 DIAGNOSIS — Z0000 Encounter for general adult medical examination without abnormal findings: Secondary | ICD-10-CM

## 2022-12-09 DIAGNOSIS — H5203 Hypermetropia, bilateral: Secondary | ICD-10-CM | POA: Diagnosis not present

## 2022-12-09 NOTE — Patient Instructions (Addendum)
Lindsay [name] , Thank you for taking time to come for your Medicare Wellness Visit. I appreciate your ongoing commitment to your health goals. Please review the following plan we discussed and let me know if I can assist you in the future.   Referrals/Orders/Follow-Ups/Clinician Recommendations:   This is a list of the screening recommended for you and due dates:  Health Maintenance  Topic Date Due   COVID-19 Vaccine (6 - 2023-24 season) 10/31/2022   Medicare Annual Wellness Visit  12/09/2023   DTaP/Tdap/Td vaccine (4 - Td or Tdap) 06/13/2025   Pneumonia Vaccine  Completed   Flu Shot  Completed   DEXA scan (bone density measurement)  Completed   Hepatitis C Screening  Completed   Zoster (Shingles) Vaccine  Completed   HPV Vaccine  Aged Out   Colon Cancer Screening  Discontinued    Advanced directives: (Declined) Advance directive discussed with you today. Even though you declined this today, please call our office should you change your mind, and we can give you the proper paperwork for you to fill out.  Next Medicare Annual Wellness Visit scheduled for next year: Yes

## 2022-12-09 NOTE — Progress Notes (Signed)
Subjective:   Lindsay [name] is a 77 y.o. female who presents for Medicare Annual (Subsequent) preventive examination.  Visit Complete: Virtual I connected with  Lindsay [name] on 12/09/22 by a audio enabled telemedicine application and verified that I am speaking with the correct person using two identifiers.  Patient Location: Home  Provider Location: Home Office  I discussed the limitations of evaluation and management by telemedicine. The patient expressed understanding and agreed to proceed.  Vital Signs: Because this visit was a virtual/telehealth visit, some criteria may be missing or patient reported. Any vitals not documented were not able to be obtained and vitals that have been documented are patient reported.    Cardiac Risk Factors include: advanced age (>44men, >67 women);Other (see comment), Risk factor comments: Dx: STV     Objective:    Today's Vitals   12/09/22 1339  Weight: 177 lb (80.3 kg)  Height: 5' 3.5" (1.613 m)   Body mass index is 30.86 kg/m.     12/09/2022    1:50 PM 08/29/2020    4:30 PM 08/25/2020    3:05 PM 05/04/2019   11:20 AM 12/17/2016   11:22 AM 08/27/2016   11:14 AM 08/05/2011    1:34 PM  Advanced Directives  Does Patient Have a Medical Advance Directive? No No No No No No Patient does not have advance directive  Would patient like information on creating a medical advance directive? No - Patient declined No - Patient declined No - Patient declined No - Patient declined  No - Patient declined   Pre-existing out of facility DNR order (yellow form or pink MOST form)       No    Current Medications (verified) Outpatient Encounter Medications as of 12/09/2022  Medication Sig   Ascorbic Acid (VITAMIN C) 1000 MG tablet Take 1,000 mg by mouth at bedtime.   aspirin EC 81 MG tablet Take 81 mg by mouth daily. Swallow whole.   citalopram (CELEXA) 10 MG tablet Take 1 tablet (10 mg total) by mouth daily.   ELDERBERRY PO Take 5 mLs by mouth at  bedtime.   levothyroxine (SYNTHROID, LEVOTHROID) 25 MCG tablet Take 25 mcg by mouth daily.   Multiple Vitamins-Minerals (ZINC PO) Take 1 tablet by mouth daily.   Probiotic Product (PROBIOTIC FORMULA PO) Take 1 capsule by mouth daily. New chapter All-Flora.   Propylene Glycol (SYSTANE COMPLETE OP) Place 1 drop into both eyes daily.   sodium fluoride (FLUORISHIELD) 1.1 % GEL dental gel Place 1 application onto teeth at bedtime.   TURMERIC PO Take 1 tablet by mouth daily.   Vitamin D, Ergocalciferol, 50000 units CAPS Take 1 capsule by mouth once a week.   Facility-Administered Encounter Medications as of 12/09/2022  Medication   bupivacaine (MARCAINE) 0.5 % injection 2 mL   lidocaine (XYLOCAINE) 2 % (with pres) injection 20 mg   methylPREDNISolone acetate (DEPO-MEDROL) injection 40 mg    Allergies (verified) Nickel   History: Past Medical History:  Diagnosis Date   Arthritis    Dysrhythmia 2014   SVT. ablation done. no problems since   Fibromyalgia    Heart murmur 2019   Hepatitis 1967   in college with mononucleosis   Herpes 1984   Hypothyroidism    Multinodular goiter    Osteopenia    S/P AV nodal ablation 08/05/2011   SVT (supraventricular tachycardia) (HCC)    Thyroid disease    thyroid nodule being monitored by Dr. Talmage Nap   Past Surgical History:  Procedure Laterality Date   BREAST CYST ASPIRATION Bilateral 12/15/2000   CATARACT EXTRACTION W/ INTRAOCULAR LENS IMPLANT Bilateral 2014   paragard iud     8/90   SUPRAVENTRICULAR TACHYCARDIA ABLATION N/A 08/05/2011   Procedure: SUPRAVENTRICULAR TACHYCARDIA ABLATION;  Surgeon: Marinus Maw, MD;  Location: [facility] CATH LAB;  Service: Cardiovascular;  Laterality: N/A;   TONSILLECTOMY  1950   TOTAL HIP ARTHROPLASTY Left 08/29/2020   Procedure: LEFT TOTAL HIP ARTHROPLASTY ANTERIOR APPROACH;  Surgeon: Kathryne Hitch, MD;  Location: [facility] ORS;  Service: Orthopedics;  Laterality: Left;   Family History  Problem Relation Age of  Onset   Dementia Mother 10       alive   Transient ischemic attack Mother        hx of; has a pacemaker   Hypothyroidism Mother    Other Father 62       old age. Develop CHF the last 4 months   Other Sister 61       died of thymoma   Hypothyroidism Sister    Other Maternal Aunt        nasal tumor   Heart attack Maternal Grandfather    Hypothyroidism Maternal Aunt    Stroke Paternal Grandfather    Colon cancer Neg Hx    Esophageal cancer Neg Hx    Pancreatic cancer Neg Hx    Prostate cancer Neg Hx    Rectal cancer Neg Hx    Stomach cancer Neg Hx    Social History   Socioeconomic History   Marital status: Divorced    Spouse name: Not on file   Number of children: 2   Years of education: Not on file   Highest education level: Not on file  Occupational History   Not on file  Tobacco Use   Smoking status: Former    Current packs/day: 0.00    Average packs/day: 2.0 packs/day for 6.0 years (12.0 ttl pk-yrs)    Types: Cigarettes    Start date: 03/01/1964    Quit date: 03/01/1970    Years since quitting: 52.8    Passive exposure: Never   Smokeless tobacco: Never  Vaping Use   Vaping status: Never Used  Substance and Sexual Activity   Alcohol use: Yes    Alcohol/week: 0.0 standard drinks of alcohol    Comment: occasional   Drug use: No   Sexual activity: Never    Birth control/protection: Post-menopausal  Other Topics Concern   Not on file  Social History Narrative   Not on file   Social Determinants of Health   Financial Resource Strain: Low Risk  (12/09/2022)   Overall Financial Resource Strain (CARDIA)    Difficulty of Paying Living Expenses: Not hard at all  Food Insecurity: No Food Insecurity (12/09/2022)   Hunger Vital Sign    Worried About Running Out of Food in the Last Year: Never true    Ran Out of Food in the Last Year: Never true  Transportation Needs: No Transportation Needs (12/09/2022)   PRAPARE - Administrator, Civil Service (Medical):  No    Lack of Transportation (Non-Medical): No  Physical Activity: Insufficiently Active (12/09/2022)   Exercise Vital Sign    Days of Exercise per Week: 2 days    Minutes of Exercise per Session: 30 min  Stress: No Stress Concern Present (12/09/2022)   [employer] of Occupational Health - Occupational Stress Questionnaire    Feeling of Stress : Not at all  Social Connections:  Moderately Integrated (12/09/2022)   Social Connection and Isolation Panel [NHANES]    Frequency of Communication with Friends and Family: More than three times a week    Frequency of Social Gatherings with Friends and Family: More than three times a week    Attends Religious Services: More than 4 times per year    Active Member of [employer] or Organizations: Yes    Attends Engineer, structural: More than 4 times per year    Marital Status: Divorced    Tobacco Counseling Counseling given: Not Answered   Clinical Intake:  Pre-visit preparation completed: Yes  Pain : No/denies pain     BMI - recorded: 30.86 Nutritional Status: BMI > 30  Obese Nutritional Risks: None Diabetes: No  How often do you need to have someone help you when you read instructions, pamphlets, or other written materials from your doctor or pharmacy?: 1 - Never  Interpreter Needed?: No  Information entered by :: Theresa Mulligan LPN   Activities of Daily Living    12/09/2022    1:46 PM  In your present state of health, do you have any difficulty performing the following activities:  Hearing? 0  Vision? 0  Difficulty concentrating or making decisions? 0  Walking or climbing stairs? 0  Dressing or bathing? 0  Doing errands, shopping? 0  Preparing Food and eating ? N  Using the Toilet? N  In the past six months, have you accidently leaked urine? Y  Comment Followed by Urologist  Do you have problems with loss of bowel control? N  Managing your Medications? N  Managing your Finances? N  Housekeeping or managing  your Housekeeping? N    Patient Care Team: Melida Quitter, PA as PCP - General (Family Medicine) Yates Decamp, MD as Consulting Physician (Cardiology) Marinus Maw, MD as Consulting Physician (Cardiology) Charna Elizabeth, MD as Consulting Physician (Gastroenterology) Dorisann Frames, MD as Consulting Physician (Endocrinology) Fredrich Birks, OD as Referring Physician (Optometry) Mia Creek, MD as Consulting Physician (Ophthalmology) Beryle Flock, PT as Physical Therapist (Physical Therapy) Jerene Bears, MD as Consulting Physician (Gynecology)  Indicate any recent Medical Services you may have received from other than [facility] providers in the past year (date may be approximate).     Assessment:   This is a routine wellness examination for Kieu.  Hearing/Vision screen Hearing Screening - Comments:: Denies hearing difficulties   Vision Screening - Comments:: Wears rx glasses - up to date with routine eye exams with  Dr Fredrich Birks   Goals Addressed               This Visit's Progress     Complete personal projects (pt-stated)         Depression Screen    12/09/2022    1:45 PM 12/06/2022    2:57 PM 07/28/2022   11:08 AM 06/03/2022    3:50 PM 12/02/2021    3:40 PM 09/14/2021    2:41 PM 04/21/2021   10:12 AM  PHQ 2/9 Scores  PHQ - 2 Score 0 0 0 2 1 0 2  PHQ- 9 Score 0 4 1 5 4  0 2    Fall Risk    12/09/2022    1:48 PM 06/03/2022    3:50 PM 12/02/2021    3:39 PM 09/14/2021    2:41 PM 11/24/2020    4:07 PM  Fall Risk   Falls in the past year? 1 0 0 0 0  Number falls in past yr:  0 0 0 0 0  Injury with Fall? 1 0 0 0 0  Comment Left knee injury, followed by medical attention      Risk for fall due to : No Fall Risks No Fall Risks No Fall Risks No Fall Risks No Fall Risks  Follow up Falls prevention discussed  Falls evaluation completed Falls evaluation completed Falls evaluation completed    MEDICARE RISK AT HOME: Medicare Risk at Home Any stairs in or around the home?:  Yes If so, are there any without handrails?: No Home free of loose throw rugs in walkways, pet beds, electrical cords, etc?: Yes Adequate lighting in your home to reduce risk of falls?: Yes Life alert?: No Use of a cane, walker or w/c?: No Grab bars in the bathroom?: No Shower chair or bench in shower?: No Elevated toilet seat or a handicapped toilet?: Yes  TIMED UP AND GO:  Was the test performed?  No    Cognitive Function:        12/09/2022    1:50 PM 12/02/2021    3:18 PM 11/24/2020    3:11 PM 11/26/2019    9:59 AM 11/20/2018   11:59 AM  6CIT Screen  What Year? 0 points 0 points 0 points 0 points 0 points  What month? 0 points 0 points 0 points 0 points 0 points  What time? 0 points 0 points 0 points 0 points 0 points  Count back from 20 0 points 0 points 2 points 2 points 0 points  Months in reverse 0 points 0 points 0 points 0 points 0 points  Repeat phrase 0 points 0 points 0 points 0 points 0 points  Total Score 0 points 0 points 2 points 2 points 0 points    Immunizations Immunization History  Administered Date(s) Administered   DTaP 03/02/2015   Fluad Quad(high Dose 65+) 11/24/2020, 11/17/2022   Influenza, Quadrivalent, Recombinant, Inj, Pf 12/10/2016, 12/09/2017, 12/15/2018, 01/04/2020   Influenza-Unspecified 12/14/2021   Moderna Sars-Covid-2 Vaccination 03/23/2019, 04/20/2019   PFIZER(Purple Top)SARS-COV-2 Vaccination 12/23/2019, 08/16/2020, 12/23/2021   Pneumococcal Conjugate-13 06/14/2015   Pneumococcal Polysaccharide-23 06/22/2013   RSV,unspecified 11/17/2022   Tdap 06/28/2007, 06/14/2015   Zoster Recombinant(Shingrix) 01/06/2022, 03/22/2022    TDAP status: Up to date  Flu Vaccine status: Up to date  Pneumococcal vaccine status: Up to date  Covid-19 vaccine status: Declined, Education has been provided regarding the importance of this vaccine but patient still declined. Advised may receive this vaccine at local pharmacy or Health Dept.or vaccine  clinic. Aware to provide a copy of the vaccination record if obtained from local pharmacy or Health Dept. Verbalized acceptance and understanding.  Qualifies for Shingles Vaccine? Yes   Zostavax completed Yes   Shingrix Completed?: Yes  Screening Tests Health Maintenance  Topic Date Due   COVID-19 Vaccine (6 - 2023-24 season) 10/31/2022   Medicare Annual Wellness (AWV)  12/09/2023   DTaP/Tdap/Td (4 - Td or Tdap) 06/13/2025   Pneumonia Vaccine 66+ Years old  Completed   INFLUENZA VACCINE  Completed   DEXA SCAN  Completed   Hepatitis C Screening  Completed   Zoster Vaccines- Shingrix  Completed   HPV VACCINES  Aged Out   Colonoscopy  Discontinued    Health Maintenance  Health Maintenance Due  Topic Date Due   COVID-19 Vaccine (6 - 2023-24 season) 10/31/2022        Bone Density status: Completed 06/08/19. Results reflect: Bone density results: OSTEOPENIA. Repeat every   years.  Additional Screening:    Vision Screening: Recommended annual ophthalmology exams for early detection of glaucoma and other disorders of the eye. Is the patient up to date with their annual eye exam?  Yes  Who is the provider or what is the name of the office in which the patient attends annual eye exams? Dr Fredrich Birks If pt is not established with a provider, would they like to be referred to a provider to establish care? No .   Dental Screening: Recommended annual dental exams for proper oral hygiene    Community Resource Referral / Chronic Care Management:  CRR required this visit?  No   CCM required this visit?  No     Plan:     I have personally reviewed and noted the following in the patient's chart:   Medical and social history Use of alcohol, tobacco or illicit drugs  Current medications and supplements including opioid prescriptions. Patient is not currently taking opioid prescriptions. Functional ability and status Nutritional status Physical activity Advanced  directives List of other physicians Hospitalizations, surgeries, and ER visits in previous 12 months Vitals Screenings to include cognitive, depression, and falls Referrals and appointments  In addition, I have reviewed and discussed with patient certain preventive protocols, quality metrics, and best practice recommendations. A written personalized care plan for preventive services as well as general preventive health recommendations were provided to patient.     Tillie Rung, LPN   [id number]/11/[id number]   After Visit Summary: (MyChart) Due to this being a telephonic visit, the after visit summary with patients personalized plan was offered to patient via MyChart   Nurse Notes: None

## 2023-02-04 DIAGNOSIS — E049 Nontoxic goiter, unspecified: Secondary | ICD-10-CM | POA: Diagnosis not present

## 2023-02-11 DIAGNOSIS — E049 Nontoxic goiter, unspecified: Secondary | ICD-10-CM | POA: Diagnosis not present

## 2023-03-22 ENCOUNTER — Ambulatory Visit (INDEPENDENT_AMBULATORY_CARE_PROVIDER_SITE_OTHER): Payer: Medicare Other | Admitting: Family Medicine

## 2023-03-22 ENCOUNTER — Encounter: Payer: Self-pay | Admitting: Family Medicine

## 2023-03-22 VITALS — BP 118/71 | HR 69 | Ht 63.5 in | Wt 179.8 lb

## 2023-03-22 DIAGNOSIS — L282 Other prurigo: Secondary | ICD-10-CM | POA: Diagnosis not present

## 2023-03-22 DIAGNOSIS — E559 Vitamin D deficiency, unspecified: Secondary | ICD-10-CM | POA: Diagnosis not present

## 2023-03-22 DIAGNOSIS — M545 Low back pain, unspecified: Secondary | ICD-10-CM

## 2023-03-22 MED ORDER — VITAMIN D (ERGOCALCIFEROL) 50000 UNITS PO CAPS
1.0000 | ORAL_CAPSULE | ORAL | 2 refills | Status: DC
Start: 2023-03-22 — End: 2023-06-23

## 2023-03-22 MED ORDER — TRIAMCINOLONE ACETONIDE 0.1 % EX OINT
1.0000 | TOPICAL_OINTMENT | Freq: Two times a day (BID) | CUTANEOUS | 0 refills | Status: AC
Start: 2023-03-22 — End: 2023-03-29

## 2023-03-22 NOTE — Progress Notes (Signed)
   Acute Office Visit  Subjective:     Patient ID: Lindsay [name], female    DOB: 1945/05/03, 78 y.o.   MRN: [id number]  Chief Complaint  Patient presents with   Rash    HPI Patient is in today for pain on her left side and back as well as a rash on her lower back.  She states that she has had the symptoms "for a while".  She believes they initially started fall 2024, the back pain has been ongoing for longer than rash.  She initially believed that the rash may have been due to contact with a new allergen, but she has not changed detergent, soap, lotion, etc.  The rash has been itchy but never painful.  She has not tried any over-the-counter remedies at home.  She is up-to-date on all vaccinations including shingles.  She describes the pain in her left side and back as a dull aching pain, it has never been severe enough that she has taken Tylenol or ibuprofen.  She generally tries to avoid Tylenol and ibuprofen because they cause GI upset.  She has a known history of osteoarthritis with left hip replacement as of 2022 and has had bilateral lower back muscle spasm in the past.  ROS See HPI    Objective:    BP 118/71   Pulse 69   Ht 5' 3.5" (1.613 m)   Wt 179 lb 12 oz (81.5 kg)   SpO2 96%   BMI 31.34 kg/m   Physical Exam Constitutional:      General: She is not in acute distress.    Appearance: Normal appearance.  HENT:     Head: Normocephalic and atraumatic.  Cardiovascular:     Rate and Rhythm: Normal rate and regular rhythm.     Heart sounds: No murmur heard.    No friction rub. No gallop.  Pulmonary:     Effort: Pulmonary effort is normal. No respiratory distress.     Breath sounds: No wheezing, rhonchi or rales.  Musculoskeletal:        General: Tenderness (Minimal TTP over left flank inferior to rib cage) present. Normal range of motion.  Skin:    General: Skin is warm and dry.     Findings: Rash (Nonerythematous nontender rash over left lumbar region.  Small 2-3 mm  raised bumps) present.  Neurological:     Mental Status: She is alert and oriented to person, place, and time.      Assessment & Plan:  Pruritic rash -     Triamcinolone Acetonide; Apply 1 Application topically 2 (two) times daily for 7 days. To affected areas  Dispense: 15 g; Refill: 0  Acute left-sided low back pain without sciatica  Vitamin D insufficiency -     Vitamin D (Ergocalciferol); Take 1 capsule by mouth once a week.  Dispense: 12 capsule; Refill: 2  Trial of triamcinolone topically for pruritic rash.  Recommended gentle stretching and application of something like IcyHot to relieve muscle tightness on the left side.  Patient would like to avoid Tylenol, ibuprofen, and any muscle relaxers.  We discussed that if needed in the future, we could trial a muscle relaxer, start PT, or refer to orthopedics for further evaluation.  Start with conservative treatment.  Return if symptoms worsen or fail to improve.  Melida Quitter, PA

## 2023-03-22 NOTE — Patient Instructions (Signed)
Pick up some Icy Hot over the counter. Unfortunately there is not a prescription version I can send :(  I recommend the exercises I provided twice a day. Hold each stretch for 10-30 seconds as tolerated.  In the future, if we need to add a muscle relaxer or evaluate the pain further please let me know!

## 2023-04-07 ENCOUNTER — Encounter: Payer: Self-pay | Admitting: Family Medicine

## 2023-05-17 ENCOUNTER — Other Ambulatory Visit: Payer: Self-pay | Admitting: Family Medicine

## 2023-05-17 DIAGNOSIS — Z1231 Encounter for screening mammogram for malignant neoplasm of breast: Secondary | ICD-10-CM

## 2023-05-19 ENCOUNTER — Ambulatory Visit: Payer: Medicare Other | Admitting: Dermatology

## 2023-05-19 VITALS — BP 112/75

## 2023-05-19 DIAGNOSIS — D225 Melanocytic nevi of trunk: Secondary | ICD-10-CM

## 2023-05-19 DIAGNOSIS — B078 Other viral warts: Secondary | ICD-10-CM

## 2023-05-19 DIAGNOSIS — D485 Neoplasm of uncertain behavior of skin: Secondary | ICD-10-CM

## 2023-05-19 DIAGNOSIS — L821 Other seborrheic keratosis: Secondary | ICD-10-CM

## 2023-05-19 DIAGNOSIS — L814 Other melanin hyperpigmentation: Secondary | ICD-10-CM | POA: Diagnosis not present

## 2023-05-19 DIAGNOSIS — D229 Melanocytic nevi, unspecified: Secondary | ICD-10-CM

## 2023-05-19 DIAGNOSIS — W908XXA Exposure to other nonionizing radiation, initial encounter: Secondary | ICD-10-CM

## 2023-05-19 DIAGNOSIS — D492 Neoplasm of unspecified behavior of bone, soft tissue, and skin: Secondary | ICD-10-CM | POA: Diagnosis not present

## 2023-05-19 DIAGNOSIS — Z1283 Encounter for screening for malignant neoplasm of skin: Secondary | ICD-10-CM | POA: Diagnosis not present

## 2023-05-19 DIAGNOSIS — L578 Other skin changes due to chronic exposure to nonionizing radiation: Secondary | ICD-10-CM

## 2023-05-19 DIAGNOSIS — D1801 Hemangioma of skin and subcutaneous tissue: Secondary | ICD-10-CM

## 2023-05-19 DIAGNOSIS — D170 Benign lipomatous neoplasm of skin and subcutaneous tissue of head, face and neck: Secondary | ICD-10-CM

## 2023-05-19 NOTE — Progress Notes (Signed)
 Follow-Up Visit   Subjective  Lindsay [name] is a 78 y.o. female who presents for the following: Skin Cancer Screening and Full Body Skin Exam - No history of skin cancer , no family history of skin cancer. she has a spot on her forehead that looks swollen and fleshy. It does not bother her but her PCP thought she should have a dermatologist look at it.  The patient presents for Total-Body Skin Exam (TBSE) for skin cancer screening and mole check. The patient has spots, moles and lesions to be evaluated, some may be new or changing and the patient may have concern these could be cancer.    The following portions of the chart were reviewed this encounter and updated as appropriate: medications, allergies, medical history  Review of Systems:  No other skin or systemic complaints except as noted in HPI or Assessment and Plan.  Objective  Well appearing patient in no apparent distress; mood and affect are within normal limits.  A full examination was performed including scalp, head, eyes, ears, nose, lips, neck, chest, axillae, abdomen, back, buttocks, bilateral upper extremities, bilateral lower extremities, hands, feet, fingers, toes, fingernails, and toenails. All findings within normal limits unless otherwise noted below.   Relevant physical exam findings are noted in the Assessment and Plan.          Left mid back inf 7 mm brown macule  Left mid back sup 7 mm brown macule  Right 2nd Finger Tip Verrucous papule  Assessment & Plan   SKIN CANCER SCREENING PERFORMED TODAY.  ACTINIC DAMAGE - Chronic condition, secondary to cumulative UV/sun exposure - diffuse scaly erythematous macules with underlying dyspigmentation - Recommend daily broad spectrum sunscreen SPF 30+ to sun-exposed areas, reapply every 2 hours as needed.  - Staying in the shade or wearing long sleeves, sun glasses (UVA+UVB protection) and wide brim hats (4-inch brim around the entire circumference of the  hat) are also recommended for sun protection.  - Call for new or changing lesions.  LENTIGINES, SEBORRHEIC KERATOSES, HEMANGIOMAS - Benign normal skin lesions - Benign-appearing - Call for any changes  MELANOCYTIC NEVI - Tan-brown and/or pink-flesh-colored symmetric macules and papules - Benign appearing on exam today - Observation - Call clinic for new or changing moles - Recommend daily use of broad spectrum spf 30+ sunscreen to sun-exposed areas.   Lipoma  Exam: Subcutaneous rubbery nodule Location: Left forehead  Benign-appearing. Exam most consistent with a lipoma. Discussed that a lipoma is a benign fatty growth that can grow over time and sometimes get irritated. Recommend observation if it is not bothersome or changing. Discussed option of ILK injections or surgical excision to remove it if it is growing, symptomatic, or other changes noted. Please call for new or changing lesions so they can be evaluated.     NEOPLASM OF UNCERTAIN BEHAVIOR OF SKIN (2) Left mid back inf Epidermal / dermal shaving  Lesion diameter (cm):  0.7 Informed consent: discussed and consent obtained   Timeout: patient name, date of birth, surgical site, and procedure verified   Procedure prep:  Patient was prepped and draped in usual sterile fashion Prep type:  Isopropyl alcohol Anesthesia: the lesion was anesthetized in a standard fashion   Anesthetic:  1% lidocaine w/ epinephrine 1-100,000 buffered w/ 8.4% NaHCO3 Instrument used: flexible razor blade   Hemostasis achieved with: pressure, aluminum chloride and electrodesiccation   Outcome: patient tolerated procedure well   Post-procedure details: sterile dressing applied and wound care instructions given  Dressing type: bandage and petrolatum   Specimen 1 - Surgical pathology Differential Diagnosis: R/O DN  Check Margins: No Left mid back sup Epidermal / dermal shaving  Lesion diameter (cm):  0.7 Informed consent: discussed and consent  obtained   Timeout: patient name, date of birth, surgical site, and procedure verified   Procedure prep:  Patient was prepped and draped in usual sterile fashion Prep type:  Isopropyl alcohol Anesthesia: the lesion was anesthetized in a standard fashion   Anesthetic:  1% lidocaine w/ epinephrine 1-100,000 buffered w/ 8.4% NaHCO3 Instrument used: flexible razor blade   Hemostasis achieved with: pressure, aluminum chloride and electrodesiccation   Outcome: patient tolerated procedure well   Post-procedure details: sterile dressing applied and wound care instructions given   Dressing type: bandage and petrolatum   Specimen 2 - Surgical pathology Differential Diagnosis: R/O DN  Check Margins: No OTHER VIRAL WARTS Right 2nd Finger Tip Destruction of lesion - Right 2nd Finger Tip Complexity: simple   Destruction method: cryotherapy   Informed consent: discussed and consent obtained   Timeout:  patient name, date of birth, surgical site, and procedure verified Lesion destroyed using liquid nitrogen: Yes   Region frozen until ice ball extended beyond lesion: Yes   Outcome: patient tolerated procedure well with no complications   Post-procedure details: wound care instructions given   SKIN EXAM FOR MALIGNANT NEOPLASM   MULTIPLE BENIGN MELANOCYTIC NEVI   CHERRY ANGIOMA   LENTIGINES   ACTINIC SKIN DAMAGE   LIPOMA OF HEAD   Return in about 2 months (around 07/19/2023) for Wart Follow up.  I, Joanie Coddington, CMA, am acting as scribe for [employer], DO .   Documentation: I have reviewed the above documentation for accuracy and completeness, and I agree with the above.  Langston Reusing, DO

## 2023-05-19 NOTE — Patient Instructions (Addendum)

## 2023-05-20 LAB — SURGICAL PATHOLOGY

## 2023-05-23 ENCOUNTER — Encounter: Payer: Self-pay | Admitting: Dermatology

## 2023-05-23 NOTE — Progress Notes (Signed)
 Hi Lindsay [name]  Dr. Onalee Hua reviewed your biopsy results and they showed the spots removed were a little "abnormal" but not cancerous.  No additional treatment is required.  We will continue to monitor the area for re-pigmentation during your annual skin exams. The detailed report is available to view in MyChart.  Have a great day!  Kind Regards,  Dr. Kermit Balo Care Team

## 2023-05-30 ENCOUNTER — Encounter: Payer: Self-pay | Admitting: Dermatology

## 2023-06-09 ENCOUNTER — Ambulatory Visit
Admission: RE | Admit: 2023-06-09 | Discharge: 2023-06-09 | Disposition: A | Discharge status: Home / Self Care | Source: Ambulatory Visit | Attending: Family Medicine

## 2023-06-09 DIAGNOSIS — Z1231 Encounter for screening mammogram for malignant neoplasm of breast: Secondary | ICD-10-CM | POA: Diagnosis not present

## 2023-06-23 ENCOUNTER — Other Ambulatory Visit: Payer: Self-pay | Admitting: Family Medicine

## 2023-06-23 DIAGNOSIS — E559 Vitamin D deficiency, unspecified: Secondary | ICD-10-CM

## 2023-06-23 MED ORDER — VITAMIN D (ERGOCALCIFEROL) 50000 UNITS PO CAPS
1.0000 | ORAL_CAPSULE | ORAL | 2 refills | Status: DC
Start: 2023-06-23 — End: 2023-10-25

## 2023-06-23 NOTE — Telephone Encounter (Signed)
 Copied from CRM [id number]. Topic: Clinical - Medication Refill >> Jun 23, 2023  3:32 PM Lizabeth Riggs wrote: Most Recent Primary Care Visit:  Provider: Maryclare Smoke A  Department: [facility]  Visit Type: OFFICE VISIT  Date: 03/22/2023  Medication: Vitamin D , Ergocalciferol , 50000 units CAPS  Has the patient contacted their pharmacy? Yes (Agent: If no, request that the patient contact the pharmacy for the refill. If patient does not wish to contact the pharmacy document the reason why and proceed with request.) (Agent: If yes, when and what did the pharmacy advise?) Pharmacy needs order to refill  Is this the correct pharmacy for this prescription? Yes If no, delete pharmacy and type the correct one.  This is the patient's preferred pharmacy:  [facility] - [place], [place] - [address] [place] [address] Phone: [phone] Fax: [phone]  Has the prescription been filled recently? No  Is the patient out of the medication? Yes  Has the patient been seen for an appointment in the last year OR does the patient have an upcoming appointment? Yes  Can we respond through MyChart? Yes  Agent: Please be advised that Rx refills may take up to 3 business days. We ask that you follow-up with your pharmacy.

## 2023-08-16 ENCOUNTER — Encounter: Payer: Self-pay | Admitting: Dermatology

## 2023-08-16 ENCOUNTER — Ambulatory Visit: Admitting: Dermatology

## 2023-08-16 VITALS — BP 124/76 | HR 71

## 2023-08-16 DIAGNOSIS — B079 Viral wart, unspecified: Secondary | ICD-10-CM | POA: Diagnosis not present

## 2023-08-16 NOTE — Patient Instructions (Addendum)

## 2023-08-16 NOTE — Progress Notes (Signed)
   Follow-Up Visit   Subjective  Lindsay [name] is a 78 y.o. female who presents for the following: Warts  Patient present today for follow up visit for wart. Patient was last evaluated on 05/19/23. At this visit patient had Cryo Therapy Completed.  Patient reports sxs are improved but has now come back. Patient denies medication changes.  The following portions of the chart were reviewed this encounter and updated as appropriate: medications, allergies, medical history  Review of Systems:  No other skin or systemic complaints except as noted in HPI or Assessment and Plan.  Objective  Well appearing patient in no apparent distress; mood and affect are within normal limits.  A focused examination was performed of the following areas: Right 2nd finger tip  Relevant exam findings are noted in the Assessment and Plan.       Right 2nd Finger Tip Verrucous papules   Assessment & Plan   WART Exam: verrucous papule(s)  Counseling Discussed viral / HPV (Human Papilloma Virus) etiology and risk of spread /infectivity to other areas of body as well as to other people.  Multiple treatments and methods may be required to clear warts and it is possible treatment may not be successful.  Treatment risks include discoloration; scarring and there is still potential for wart recurrence.  Treatment Plan: - Cryo therapy and Cantharidin completed while in office  VIRAL WARTS, UNSPECIFIED TYPE Right 2nd Finger Tip Destruction of lesion - Right 2nd Finger Tip Complexity: simple   Destruction method: cryotherapy   Informed consent: discussed and consent obtained   Timeout:  patient name, date of birth, surgical site, and procedure verified Lesion destroyed using liquid nitrogen: Yes   Cryotherapy cycles:  1 Post-procedure details: wound care instructions given    Destruction of lesion - Right 2nd Finger Tip  Destruction method: chemical removal   Chemical destruction method: cantharidin    Application time:  3 seconds Procedure instructions: patient instructed to wash and dry area   Outcome: patient tolerated procedure well with no complications   Post-procedure details: wound care instructions given    Return in about 8 weeks (around 10/11/2023) for Wart F/U.  I, Lindsay [name], am acting as Neurosurgeon for [employer], DO.  Documentation: I have reviewed the above documentation for accuracy and completeness, and I agree with the above.  Lindsay Roup, DO

## 2023-08-22 ENCOUNTER — Telehealth: Payer: Self-pay

## 2023-08-22 ENCOUNTER — Other Ambulatory Visit: Payer: Self-pay | Admitting: Family Medicine

## 2023-08-22 DIAGNOSIS — F411 Generalized anxiety disorder: Secondary | ICD-10-CM

## 2023-08-22 NOTE — Telephone Encounter (Signed)
 Copied from CRM [id number]. Topic: Clinical - Medical Advice >> Aug 22, 2023  4:14 PM Sasha H wrote: Reason for CRM: Pt has moved to [place] and is in the process of finding a new provider there and is requesting a refill on citalopram  (CELEXA ) 10 MG tablet, as she is out of it. Please reach   Sent in 30 day supply with no refills

## 2023-10-10 ENCOUNTER — Ambulatory Visit: Admitting: Dermatology

## 2023-10-10 ENCOUNTER — Encounter: Payer: Self-pay | Admitting: Dermatology

## 2023-10-10 VITALS — BP 144/69 | HR 83

## 2023-10-10 DIAGNOSIS — B079 Viral wart, unspecified: Secondary | ICD-10-CM | POA: Diagnosis not present

## 2023-10-10 MED ORDER — SAFETY SEAL MISCELLANEOUS MISC
1.0000 | Freq: Every evening | 6 refills | Status: DC
Start: 2023-10-10 — End: 2023-12-15

## 2023-10-10 NOTE — Patient Instructions (Addendum)
 Date: Mon Oct 10 2023  Hello Kerisha,  Thank you for visiting today. Here is a summary of the key instructions:  - Medications:   - Continue cimetidine 200 mg tablet once daily   - Apply prescription cream every night and cover with a Band-Aid   - If irritation occurs, use every other night   - New prescription for Wart Pen will be provided  - Treatment Areas:   - Freezing treatment applied to affected area today   - Cantharone applied and covered   - Wash off treatment after 3 hours (5:30pm)   - Expect blistering within 24 hours  - After Treatment Care:   - Let the treated area heal for 1 week   - Gently remove dead skin after blistering   - Use Aquaphor and a Band-Aid when not applying other treatments  - Follow-up:   - Return for treatment every 6 weeks  - Other Instructions:   - Wear gloves while gardening to avoid HPV exposure from soil   - Prescription cream contains topical cimetidine, antivirals, and salicylic acid   - [facility] pharmacy will call for payment and mail the cream  Please reach out if you have any questions or concerns.  Warm regards,  Dr. [facility]on Lenis [facility]   Cryotherapy Aftercare  Wash gently with soap and water everyday.   Apply Vaseline and Band-Aid daily until healed.  WASH OFF IN 3 HOURS 5:45 pm  Important Information   Due to recent changes in healthcare laws, you may see results of your pathology and/or laboratory studies on MyChart before the doctors have had a chance to review them. We understand that in some cases there may be results that are confusing or concerning to you. Please understand that not all results are received at the same time and often the doctors may need to interpret multiple results in order to provide you with the best plan of care or course of treatment. Therefore, we ask that you please give us  2 business days to thoroughly review all your results before contacting the office for clarification. Should we see a  critical lab result, you will be contacted sooner.     If You Need Anything After Your Visit   If you have any questions or concerns for your doctor, please call our main line at [phone]. If no one answers, please leave a voicemail as directed and we will return your call as soon as possible. Messages left after 4 pm will be answered the following business day.    You may also send us  a message via MyChart. We typically respond to MyChart messages within 1-2 business days.  For prescription refills, please ask your pharmacy to contact our office. Our fax number is [phone].  If you have an urgent issue when the clinic is closed that cannot wait until the next business day, you can page your doctor at the number below.     Please note that while we do our best to be available for urgent issues outside of office hours, we are not available 24/7.    If you have an urgent issue and are unable to reach us , you may choose to seek medical care at your doctor's office, retail clinic, urgent care center, or emergency room.   If you have a medical emergency, please immediately call 911 or go to the emergency department. In the event of inclement weather, please call our main line at [phone] for an update on the status  of any delays or closures.  Dermatology Medication Tips: Please keep the boxes that topical medications come in in order to help keep track of the instructions about where and how to use these. Pharmacies typically print the medication instructions only on the boxes and not directly on the medication tubes.   If your medication is too expensive, please contact our office at [phone] or send us  a message through MyChart.    We are unable to tell what your co-pay for medications will be in advance as this is different depending on your insurance coverage. However, we may be able to find a substitute medication at lower cost or fill out paperwork to get insurance to cover  a needed medication.    If a prior authorization is required to get your medication covered by your insurance company, please allow us  1-2 business days to complete this process.   Drug prices often vary depending on where the prescription is filled and some pharmacies may offer cheaper prices.   The website www.goodrx.com contains coupons for medications through different pharmacies. The prices here do not account for what the cost may be with help from insurance (it may be cheaper with your insurance), but the website can give you the price if you did not use any insurance.  - You can print the associated coupon and take it with your prescription to the pharmacy.  - You may also stop by our office during regular business hours and pick up a GoodRx coupon card.  - If you need your prescription sent electronically to a different pharmacy, notify our office through [facility] or by phone at [phone]

## 2023-10-10 NOTE — Progress Notes (Signed)
   Follow-Up Visit   Subjective  Lindsay [name] is a 78 y.o. female who presents for the following: Warts  Patient present today for follow up visit for warts. Patient was last evaluated on 08/16/23. At this visit patient had Cryo therapy and Cantharidin completed while in office.She was also recommended to take otc cimetidine daily. Patietn reports she is taking the cimetidine daily but the area will go away but it will come right back.  Patient reports sxs are unchanged. Patient denies medication changes.  The following portions of the chart were reviewed this encounter and updated as appropriate: medications, allergies, medical history  Review of Systems:  No other skin or systemic complaints except as noted in HPI or Assessment and Plan.  Objective  Well appearing patient in no apparent distress; mood and affect are within normal limits.  A focused examination was performed of the following areas: Right 2nd finger  Relevant exam findings are noted in the Assessment and Plan.        Right 2nd Finger Tip Verrucous papules   Assessment & Plan   VIRAL WARTS, UNSPECIFIED TYPE Right 2nd Finger Tip Destruction of lesion - Right 2nd Finger Tip  Destruction method: cryotherapy   Timeout:  patient name, date of birth, surgical site, and procedure verified Cryotherapy cycles:  1  Destruction of lesion - Right 2nd Finger Tip  Destruction method: chemical removal   Timeout:  patient name, date of birth, surgical site, and procedure verified Chemical destruction method: cantharidin   Application time:  2 seconds Procedure instructions: patient instructed to wash and dry area   Outcome: patient tolerated procedure well with no complications   Post-procedure details: wound care instructions given   Additional details:  WASH OFF IN 3 HOURS 5:45 pm  Related Medications Safety Seal Miscellaneous MISC Apply 1 application  topically at bedtime. Medication Name: G Pen (Bioadhesive  Gel)  Return in about 6 weeks (around 11/21/2023) for Wart F/U.  I, Jetta Ager, am acting as Neurosurgeon for [employer], DO.  Documentation: I have reviewed the above documentation for accuracy and completeness, and I agree with the above.  Delon Lenis, DO

## 2023-10-20 ENCOUNTER — Telehealth: Payer: Self-pay | Admitting: Family Medicine

## 2023-10-20 DIAGNOSIS — E559 Vitamin D deficiency, unspecified: Secondary | ICD-10-CM

## 2023-10-20 DIAGNOSIS — F411 Generalized anxiety disorder: Secondary | ICD-10-CM

## 2023-10-20 NOTE — Telephone Encounter (Unsigned)
 Copied from CRM #[id number]. Topic: Clinical - Medication Refill >> Oct 20, 2023  3:43 PM Amy B wrote: Medication:  citalopram  (CELEXA ) 10 MG tablet; Vitamin D , Ergocalciferol , 50000 units CAPS  Has the patient contacted their pharmacy? No (Agent: If no, request that the patient contact the pharmacy for the refill. If patient does not wish to contact the pharmacy document the reason why and proceed with request.) (Agent: If yes, when and what did the pharmacy advise?)  This is the patient's preferred pharmacy:  [facility] - [place], [place] - [address] [place] [address] Phone: [phone] Fax: [phone]  Is this the correct pharmacy for this prescription? Yes If no, delete pharmacy and type the correct one.   Has the prescription been filled recently? No  Is the patient out of the medication? No  Has the patient been seen for an appointment in the last year OR does the patient have an upcoming appointment? Yes  Can we respond through MyChart? Yes  Agent: Please be advised that Rx refills may take up to 3 business days. We ask that you follow-up with your pharmacy.

## 2023-10-25 ENCOUNTER — Other Ambulatory Visit: Payer: Self-pay | Admitting: Family Medicine

## 2023-10-25 DIAGNOSIS — F411 Generalized anxiety disorder: Secondary | ICD-10-CM

## 2023-10-25 DIAGNOSIS — E559 Vitamin D deficiency, unspecified: Secondary | ICD-10-CM

## 2023-10-25 MED ORDER — VITAMIN D (ERGOCALCIFEROL) 50000 UNITS PO CAPS: 1.0000 | ORAL_CAPSULE | ORAL | 0 refills | Status: DC

## 2023-10-25 MED ORDER — CITALOPRAM HYDROBROMIDE 10 MG PO TABS: 10.0000 mg | ORAL_TABLET | Freq: Every day | ORAL | 2 refills | Status: DC

## 2023-10-25 NOTE — Telephone Encounter (Signed)
 Pt has an upcoming appt in October, please fill medicine to last until that appt.

## 2023-11-23 ENCOUNTER — Encounter: Payer: Self-pay | Admitting: Dermatology

## 2023-11-23 ENCOUNTER — Ambulatory Visit: Admitting: Dermatology

## 2023-11-23 VITALS — BP 116/76 | HR 70

## 2023-11-23 DIAGNOSIS — B079 Viral wart, unspecified: Secondary | ICD-10-CM | POA: Diagnosis not present

## 2023-11-23 DIAGNOSIS — L720 Epidermal cyst: Secondary | ICD-10-CM | POA: Diagnosis not present

## 2023-11-23 NOTE — Progress Notes (Addendum)
   Follow-Up Visit   Subjective  Lindsay [name] is a 78 y.o. female who presents for the following: f/u for wart  Patient present today for follow up visit for wart. Patient was last evaluated on 10/10/2023. At this visit patient was has cryotherapy completed and cantharadin was applied to wart.She was also recommended to take otc cimetidine daily and prescribed the G Wart pen to [facility] to apply nightly. Patient reports that she did not use the G pen daily as she felt it was not helping. Patient reports that she started to use Compund W for the spot. Patient reports that she has been taking the cimetidine daily. Patient reports sxs are slowly getting better. Patient denies medication changes.  Patient also has Milia she would like to have examined. She states that at her previous visit she would have them excised.    The following portions of the chart were reviewed this encounter and updated as appropriate: medications, allergies, medical history  Review of Systems:  No other skin or systemic complaints except as noted in HPI or Assessment and Plan.  Objective  Well appearing patient in no apparent distress; mood and affect are within normal limits.  A focused examination was performed of the following areas: Right index finger  Relevant exam findings are noted in the Assessment and Plan.  Right 2nd Finger Tip Verrucous papules   Assessment & Plan  VIRAL WARTS, UNSPECIFIED TYPE Right 2nd Finger Tip Destruction of lesion - Right 2nd Finger Tip  Destruction method: cryotherapy   Informed consent: discussed and consent obtained   Timeout:  patient name, date of birth, surgical site, and procedure verified Lesion destroyed using liquid nitrogen: Yes   Cryotherapy cycles:  1 Outcome: patient tolerated procedure well with no complications    Destruction of lesion - Right 2nd Finger Tip  Destruction method: chemical removal   Chemical destruction method: cantharidin   Application  time:  2 seconds Procedure instructions: patient instructed to wash and dry area   Additional details:  Wash off in 3 hours (5:30PM)  Related Medications Safety Seal Miscellaneous MISC Apply 1 application  topically at bedtime. Medication Name: G Pen (Bioadhesive Gel) MILIA (2) Left Anterior Mandible, Left Oral Commissure Acne/Milia surgery - Left Anterior Mandible, Left Oral Commissure Procedure Note: Milia Extraction  Procedure: Extraction of milia Indication:  Keratin filled cysts that are growing and irritating patient as she constantly picks at them. Location:left mandible and left oral commesure   Description: The area was cleansed with alcohol. Using a sterile 18-gauge needle, a small incision was made over the lesion. The milial contents were expressed with gentle lateral pressure. Minimal bleeding was noted. Hemostasis was achieved with topical aluminum chloride.  Outcome: The patient tolerated the procedure well without complications or adverse reactions.  Estimated blood loss: <1 mL Specimen: None Dressing: None required  Follow-up: Routine wound care instructions provided; advised to monitor for signs of infection.   Return in about 8 weeks (around 01/18/2024) for Wart F/U.  Lindsay [name], am acting as neurosurgeon for [employer], DO.  Documentation: I have reviewed the above documentation for accuracy and completeness, and I agree with the above.  Delon Lenis, DO

## 2023-11-23 NOTE — Patient Instructions (Addendum)
 Date: Wed Nov 23 2023  Hello [place],  Thank you for visiting today. Here is a summary of the key instructions:  - Wart Treatment:   - Leave the Cantharidin treatment on for 3 hours, then wash off with soap and water   - Put a bandaid on after washing off the treatment because it will be sore   - Let the area heal for 1-2 weeks until it's no longer blistery and sore   - After healing, use Wart Pen once a day and at night for 3-4 weeks to get rid of remaining viral particles   - Do not use Compound W with the Wart Pen as it might be too much treatment   - The wart is gone when the little black dots are gone and fingerprint lines come back  - Digital Mucous Cyst:   - Do not try to drain the cyst yourself with a needle as it could get infected   - Let medical professionals handle any drainage if needed   - The cyst usually goes away on its own and is not dangerous  - Follow-up:   - Return in 8 weeks to make sure the wart is completely gone   - After-visit summary will be available online  Please reach out if you have any questions or concerns.  Warm regards,  Dr. Delon Lenis Dermat[facility]     Important Information   Due to recent changes in healthcare laws, you may see results of your pathology and/or laboratory studies on MyChart before the doctors have had a chance to review them. We understand that in some cases there may be results that are confusing or concerning to you. Please understand that not all results are received at the same time and often the doctors may need to interpret multiple results in order to provide you with the best plan of care or course of treatment. Therefore, we ask that you please give us  2 business days to thoroughly review all your results before contacting the office for clarification. Should we see a critical lab result, you will be contacted sooner.     If You Need Anything After Your Visit   If you have any questions or concerns for your  doctor, please call our main line at [phone]. If no one answers, please leave a voicemail as directed and we will return your call as soon as possible. Messages left after 4 pm will be answered the following business day.    You may also send us  a message via MyChart. We typically respond to MyChart messages within 1-2 business days.  For prescription refills, please ask your pharmacy to contact our office. Our fax number is [phone].  If you have an urgent issue when the clinic is closed that cannot wait until the next business day, you can page your doctor at the number below.     Please note that while we do our best to be available for urgent issues outside of office hours, we are not available 24/7.    If you have an urgent issue and are unable to reach us , you may choose to seek medical care at your doctor's office, retail clinic, urgent care center, or emergency room.   If you have a medical emergency, please immediately call 911 or go to the emergency department. In the event of inclement weather, please call our main line at [phone] for an update on the status of any delays or closures.  Dermatology Medication Tips:  Please keep the boxes that topical medications come in in order to help keep track of the instructions about where and how to use these. Pharmacies typically print the medication instructions only on the boxes and not directly on the medication tubes.   If your medication is too expensive, please contact our office at [phone] or send us  a message through MyChart.    We are unable to tell what your co-pay for medications will be in advance as this is different depending on your insurance coverage. However, we may be able to find a substitute medication at lower cost or fill out paperwork to get insurance to cover a needed medication.    If a prior authorization is required to get your medication covered by your insurance company, please allow us  1-2  business days to complete this process.   Drug prices often vary depending on where the prescription is filled and some pharmacies may offer cheaper prices.   The website www.goodrx.com contains coupons for medications through different pharmacies. The prices here do not account for what the cost may be with help from insurance (it may be cheaper with your insurance), but the website can give you the price if you did not use any insurance.  - You can print the associated coupon and take it with your prescription to the pharmacy.  - You may also stop by our office during regular business hours and pick up a GoodRx coupon card.  - If you need your prescription sent electronically to a different pharmacy, notify our office through [facility] or by phone at [phone]

## 2023-12-15 ENCOUNTER — Ambulatory Visit: Payer: Medicare Other

## 2023-12-15 ENCOUNTER — Encounter: Payer: Self-pay | Admitting: Family Medicine

## 2023-12-15 ENCOUNTER — Ambulatory Visit: Admitting: Family Medicine

## 2023-12-15 VITALS — BP 136/83 | HR 82 | Ht 63.5 in | Wt 179.1 lb

## 2023-12-15 DIAGNOSIS — G629 Polyneuropathy, unspecified: Secondary | ICD-10-CM

## 2023-12-15 DIAGNOSIS — R7303 Prediabetes: Secondary | ICD-10-CM | POA: Diagnosis not present

## 2023-12-15 DIAGNOSIS — Z0000 Encounter for general adult medical examination without abnormal findings: Secondary | ICD-10-CM | POA: Diagnosis not present

## 2023-12-15 DIAGNOSIS — Z23 Encounter for immunization: Secondary | ICD-10-CM | POA: Diagnosis not present

## 2023-12-15 DIAGNOSIS — E785 Hyperlipidemia, unspecified: Secondary | ICD-10-CM | POA: Diagnosis not present

## 2023-12-15 DIAGNOSIS — E559 Vitamin D deficiency, unspecified: Secondary | ICD-10-CM | POA: Diagnosis not present

## 2023-12-15 DIAGNOSIS — F411 Generalized anxiety disorder: Secondary | ICD-10-CM | POA: Diagnosis not present

## 2023-12-15 DIAGNOSIS — H8110 Benign paroxysmal vertigo, unspecified ear: Secondary | ICD-10-CM | POA: Diagnosis not present

## 2023-12-15 MED ORDER — CITALOPRAM HYDROBROMIDE 10 MG PO TABS: 10.0000 mg | ORAL_TABLET | Freq: Every day | ORAL | 2 refills | Status: DC

## 2023-12-15 MED ORDER — VITAMIN D (ERGOCALCIFEROL) 50000 UNITS PO CAPS: 1.0000 | ORAL_CAPSULE | ORAL | 0 refills | Status: AC

## 2023-12-15 NOTE — Patient Instructions (Signed)
 It was nice to see you today,  We addressed the following topics today: - I have ordered labs to look into the cause of your foot numbness.  - I am sending a referral to a physical therapist near you. They can evaluate you for vertigo and help with your balance. - I have sent a refill for your escitalopram to your pharmacy. - We have given you the flu shot today. - Please schedule a follow-up appointment for six months from now. You can cancel this if you establish care with a new doctor closer to your home before then.  Have a great day,  Rolan Slain, MD

## 2023-12-15 NOTE — Patient Instructions (Signed)
 Lindsay [name],  Thank you for taking the time for your Medicare Wellness Visit. I appreciate your continued commitment to your health goals. Please review the care plan we discussed, and feel free to reach out if I can assist you further.  Medicare recommends these wellness visits once per year to help you and your care team stay ahead of potential health issues. These visits are designed to focus on prevention, allowing your provider to concentrate on managing your acute and chronic conditions during your regular appointments.  Please note that Annual Wellness Visits do not include a physical exam. Some assessments may be limited, especially if the visit was conducted virtually. If needed, we may recommend a separate in-person follow-up with your provider.  Ongoing Care Seeing your primary care provider every 3 to 6 months helps us  monitor your health and provide consistent, personalized care.   Referrals If a referral was made during today's visit and you haven't received any updates within two weeks, please contact the referred provider directly to check on the status.  Recommended Screenings:  Health Maintenance  Topic Date Due   Flu Shot  09/30/2023   COVID-19 Vaccine (6 - 2025-26 season) 10/31/2023   Medicare Annual Wellness Visit  12/14/2024   DTaP/Tdap/Td vaccine (4 - Td or Tdap) 06/13/2025   Pneumococcal Vaccine for age over 66  Completed   DEXA scan (bone density measurement)  Completed   Hepatitis C Screening  Completed   Zoster (Shingles) Vaccine  Completed   Meningitis B Vaccine  Aged Out   Breast Cancer Screening  Discontinued   Colon Cancer Screening  Discontinued       12/15/2023    1:03 PM  Advanced Directives  Does Patient Have a Medical Advance Directive? No   Advance Care Planning is important because it: Ensures you receive medical care that aligns with your values, goals, and preferences. Provides guidance to your family and loved ones, reducing the emotional  burden of decision-making during critical moments.  Vision: Annual vision screenings are recommended for early detection of glaucoma, cataracts, and diabetic retinopathy. These exams can also reveal signs of chronic conditions such as diabetes and high blood pressure.  Dental: Annual dental screenings help detect early signs of oral cancer, gum disease, and other conditions linked to overall health, including heart disease and diabetes.  Please see the attached documents for additional preventive care recommendations.

## 2023-12-15 NOTE — Progress Notes (Signed)
 Subjective:   Lindsay [name] is a 78 y.o. who presents for a Medicare Wellness preventive visit.  As a reminder, Annual Wellness Visits don't include a physical exam, and some assessments may be limited, especially if this visit is performed virtually. We may recommend an in-person follow-up visit with your provider if needed.  Visit Complete: Virtual I connected with  Lindsay [name] on 12/15/23 by a audio enabled telemedicine application and verified that I am speaking with the correct person using two identifiers.  Patient Location: Home  Provider Location: Home Office  I discussed the limitations of evaluation and management by telemedicine. The patient expressed understanding and agreed to proceed.  Vital Signs: Because this visit was a virtual/telehealth visit, some criteria may be missing or patient reported. Any vitals not documented were not able to be obtained and vitals that have been documented are patient reported.  VideoError- Librarian, academic were attempted between this provider and patient, however failed, due to patient having technical difficulties OR patient did not have access to video capability.  We continued and completed visit with audio only.   Persons Participating in Visit: Patient.  AWV Questionnaire: No: Patient Medicare AWV questionnaire was not completed prior to this visit.  Cardiac Risk Factors include: advanced age (>50men, >32 women)     Objective:    Today's Vitals   There is no height or weight on file to calculate BMI.     12/15/2023    1:03 PM 12/09/2022    1:50 PM 08/29/2020    4:30 PM 08/25/2020    3:05 PM 05/04/2019   11:20 AM 12/17/2016   11:22 AM 08/27/2016   11:14 AM  Advanced Directives  Does Patient Have a Medical Advance Directive? No No No No No No  No   Would patient like information on creating a medical advance directive?  No - Patient declined No - Patient declined No - Patient declined No -  Patient declined  No - Patient declined      Data saved with a previous flowsheet row definition    Current Medications (verified) Outpatient Encounter Medications as of 12/15/2023  Medication Sig   Ascorbic Acid  (VITAMIN C) 1000 MG tablet Take 1,000 mg by mouth at bedtime.   aspirin  EC 81 MG tablet Take 81 mg by mouth daily. Swallow whole.   citalopram  (CELEXA ) 10 MG tablet Take 1 tablet (10 mg total) by mouth daily.   levothyroxine  (SYNTHROID , LEVOTHROID) 25 MCG tablet Take 25 mcg by mouth daily.   Multiple Vitamin (MULTIVITAMIN) tablet Take 1 tablet by mouth daily.   Multiple Vitamins-Minerals (ZINC PO) Take 1 tablet by mouth daily.   Probiotic Product (PROBIOTIC FORMULA PO) Take 1 capsule by mouth daily. New chapter All-Flora.   Propylene Glycol (SYSTANE COMPLETE OP) Place 1 drop into both eyes daily.   Safety Seal Miscellaneous MISC Apply 1 application  topically at bedtime. Medication Name: G Pen (Bioadhesive Gel)   sodium fluoride (FLUORISHIELD) 1.1 % GEL dental gel Place 1 application onto teeth at bedtime.   TURMERIC PO Take 1 tablet by mouth daily.   Vitamin D , Ergocalciferol , 50000 units CAPS Take 1 capsule by mouth once a week.   Zinc 50 MG TABS Take 50 mg by mouth.   ELDERBERRY PO Take 5 mLs by mouth at bedtime. (Patient not taking: Reported on 12/15/2023)   Facility-Administered Encounter Medications as of 12/15/2023  Medication   bupivacaine  (MARCAINE ) 0.5 % injection 2 mL   lidocaine  (  XYLOCAINE ) 2 % (with pres) injection 20 mg   methylPREDNISolone  acetate (DEPO-MEDROL ) injection 40 mg    Allergies (verified) Nickel   History: Past Medical History:  Diagnosis Date   Arthritis    Dysrhythmia 2014   SVT. ablation done. no problems since   Fibromyalgia    Heart murmur 2019   Hepatitis 1967   in college with mononucleosis   Herpes 1984   Hypothyroidism    Multinodular goiter    Osteopenia    S/P AV nodal ablation 08/05/2011   SVT (supraventricular  tachycardia)    Thyroid  disease    thyroid  nodule being monitored by Dr. Tommas   Past Surgical History:  Procedure Laterality Date   BREAST CYST ASPIRATION Bilateral 12/15/2000   CATARACT EXTRACTION W/ INTRAOCULAR LENS IMPLANT Bilateral 2014   paragard iud     8/90   SUPRAVENTRICULAR TACHYCARDIA ABLATION N/A 08/05/2011   Procedure: SUPRAVENTRICULAR TACHYCARDIA ABLATION;  Surgeon: Danelle LELON Birmingham, MD;  Location: [facility] CATH LAB;  Service: Cardiovascular;  Laterality: N/A;   TONSILLECTOMY  1950   TOTAL HIP ARTHROPLASTY Left 08/29/2020   Procedure: LEFT TOTAL HIP ARTHROPLASTY ANTERIOR APPROACH;  Surgeon: Vernetta Lonni GRADE, MD;  Location: [facility] ORS;  Service: Orthopedics;  Laterality: Left;   Family History  Problem Relation Age of Onset   Dementia Mother 50       alive   Transient ischemic attack Mother        hx of; has a pacemaker   Hypothyroidism Mother    Other Father 78       old age. Develop CHF the last 4 months   Other Sister 76       died of thymoma   Hypothyroidism Sister    Other Maternal Aunt        nasal tumor   Heart attack Maternal Grandfather    Hypothyroidism Maternal Aunt    Stroke Paternal Grandfather    Colon cancer Neg Hx    Esophageal cancer Neg Hx    Pancreatic cancer Neg Hx    Prostate cancer Neg Hx    Rectal cancer Neg Hx    Stomach cancer Neg Hx    Social History   Socioeconomic History   Marital status: Divorced    Spouse name: Not on file   Number of children: 2   Years of education: Not on file   Highest education level: Not on file  Occupational History   Not on file  Tobacco Use   Smoking status: Former    Current packs/day: 0.00    Average packs/day: 2.0 packs/day for 6.0 years (12.0 ttl pk-yrs)    Types: Cigarettes    Start date: 03/01/1964    Quit date: 03/01/1970    Years since quitting: 53.8    Passive exposure: Never   Smokeless tobacco: Never  Vaping Use   Vaping status: Never Used  Substance and Sexual Activity   Alcohol use:  Not Currently    Comment: occasional   Drug use: No   Sexual activity: Never    Birth control/protection: Post-menopausal  Other Topics Concern   Not on file  Social History Narrative   Not on file   Social Drivers of Health   Financial Resource Strain: Low Risk  (12/15/2023)   Overall Financial Resource Strain (CARDIA)    Difficulty of Paying Living Expenses: Not hard at all  Food Insecurity: No Food Insecurity (12/15/2023)   Hunger Vital Sign    Worried About Programme researcher, broadcasting/film/video in  the Last Year: Never true    Ran Out of Food in the Last Year: Never true  Transportation Needs: No Transportation Needs (12/15/2023)   PRAPARE - Administrator, Civil Service (Medical): No    Lack of Transportation (Non-Medical): No  Physical Activity: Inactive (12/15/2023)   Exercise Vital Sign    Days of Exercise per Week: 0 days    Minutes of Exercise per Session: 0 min  Stress: No Stress Concern Present (12/15/2023)   [employer] of Occupational Health - Occupational Stress Questionnaire    Feeling of Stress: Not at all  Social Connections: Moderately Integrated (12/15/2023)   Social Connection and Isolation Panel    Frequency of Communication with Friends and Family: Three times a week    Frequency of Social Gatherings with Friends and Family: Twice a week    Attends Religious Services: 1 to 4 times per year    Active Member of [employer] or Organizations: Yes    Attends Engineer, structural: More than 4 times per year    Marital Status: Divorced    Tobacco Counseling Counseling given: Not Answered    Clinical Intake:  Pre-visit preparation completed: Yes  Pain : No/denies pain     Nutritional Risks: None Diabetes: No  Lab Results  Component Value Date   HGBA1C 5.6 11/30/2022   HGBA1C 5.5 05/24/2022   HGBA1C 5.8 (H) 11/26/2021     How often do you need to have someone help you when you read instructions, pamphlets, or other written materials from  your doctor or pharmacy?: 1 - Never  Interpreter Needed?: No  Information entered by :: NAllen LPN   Activities of Daily Living     12/15/2023   12:56 PM  In your present state of health, do you have any difficulty performing the following activities:  Hearing? 0  Vision? 0  Difficulty concentrating or making decisions? 1  Walking or climbing stairs? 0  Dressing or bathing? 0  Doing errands, shopping? 0  Preparing Food and eating ? N  Using the Toilet? N  In the past six months, have you accidently leaked urine? Y  Comment over active bladder  Do you have problems with loss of bowel control? N  Managing your Medications? N  Managing your Finances? N  Housekeeping or managing your Housekeeping? N    Patient Care Team: Chandra Toribio POUR, MD as PCP - General (Family Medicine) Ladona Heinz, MD as Consulting Physician (Cardiology) Waddell Danelle ORN, MD as Consulting Physician (Cardiology) Kristie Lamprey, MD as Consulting Physician (Gastroenterology) Tommas Pears, MD as Consulting Physician (Endocrinology) Glendia Simmonds, OD as Referring Physician (Optometry) Lavonia Lye, MD as Consulting Physician (Ophthalmology) Alba Crock, PT as Physical Therapist (Physical Therapy) Cleotilde Ronal RAMAN, MD as Consulting Physician (Gynecology)  I have updated your Care Teams any recent Medical Services you may have received from other providers in the past year.     Assessment:   This is a routine wellness examination for Lindsay [name].  Hearing/Vision screen Hearing Screening - Comments:: Denies hearing issues Vision Screening - Comments:: Regular eye exams, Dr.Scott   Goals Addressed             This Visit's Progress    Patient Stated       12/15/2023, start exercising again       Depression Screen     12/15/2023    1:04 PM 03/22/2023   10:33 AM 12/09/2022    1:45 PM 12/06/2022    2:57  PM 07/28/2022   11:08 AM 06/03/2022    3:50 PM 12/02/2021    3:40 PM  PHQ 2/9 Scores  PHQ - 2 Score  0 0 0 0 0 2 1  PHQ- 9 Score 3 1 0 4 1 5 4     Fall Risk     12/15/2023    1:04 PM 12/09/2022    1:48 PM 06/03/2022    3:50 PM 12/02/2021    3:39 PM 09/14/2021    2:41 PM  Fall Risk   Falls in the past year? 0 1 0 0 0  Number falls in past yr: 0 0 0 0 0  Injury with Fall? 0 1 0 0 0  Comment  Left knee injury, followed by medical attention     Risk for fall due to : Medication side effect No Fall Risks No Fall Risks No Fall Risks No Fall Risks  Follow up Falls evaluation completed;Falls prevention discussed Falls prevention discussed  Falls evaluation completed  Falls evaluation completed      Data saved with a previous flowsheet row definition    MEDICARE RISK AT HOME:  Medicare Risk at Home Any stairs in or around the home?: No If so, are there any without handrails?: No Home free of loose throw rugs in walkways, pet beds, electrical cords, etc?: Yes Adequate lighting in your home to reduce risk of falls?: Yes Life alert?: No Use of a cane, walker or w/c?: No Grab bars in the bathroom?: Yes Shower chair or bench in shower?: Yes Elevated toilet seat or a handicapped toilet?: Yes  TIMED UP AND GO:  Was the test performed?  No  Cognitive Function: 6CIT completed        12/15/2023    1:05 PM 12/09/2022    1:50 PM 12/02/2021    3:18 PM 11/24/2020    3:11 PM 11/26/2019    9:59 AM  6CIT Screen  What Year? 0 points 0 points 0 points 0 points 0 points  What month? 0 points 0 points 0 points 0 points 0 points  What time? 0 points 0 points 0 points 0 points 0 points  Count back from 20 0 points 0 points 0 points 2 points 2 points  Months in reverse 0 points 0 points 0 points 0 points 0 points  Repeat phrase 0 points 0 points 0 points 0 points 0 points  Total Score 0 points 0 points 0 points 2 points 2 points    Immunizations Immunization History  Administered Date(s) Administered   DTaP 03/02/2015   Fluad Quad(high Dose 65+) 11/24/2020, 11/17/2022   Influenza,  Quadrivalent, Recombinant, Inj, Pf 12/10/2016, 12/09/2017, 12/15/2018, 01/04/2020   Influenza-Unspecified 12/14/2021   Moderna Sars-Covid-2 Vaccination 03/23/2019, 04/20/2019   PFIZER(Purple Top)SARS-COV-2 Vaccination 12/23/2019, 08/16/2020, 12/23/2021   Pneumococcal Conjugate-13 06/14/2015   Pneumococcal Polysaccharide-23 06/22/2013   RSV,unspecified 11/17/2022   Tdap 06/28/2007, 06/14/2015   Zoster Recombinant(Shingrix) 01/06/2022, 03/22/2022    Screening Tests Health Maintenance  Topic Date Due   Influenza Vaccine  09/30/2023   COVID-19 Vaccine (6 - 2025-26 season) 10/31/2023   Medicare Annual Wellness (AWV)  12/14/2024   DTaP/Tdap/Td (4 - Td or Tdap) 06/13/2025   Pneumococcal Vaccine: 50+ Years  Completed   DEXA SCAN  Completed   Hepatitis C Screening  Completed   Zoster Vaccines- Shingrix  Completed   Meningococcal B Vaccine  Aged Out   Mammogram  Discontinued   Colonoscopy  Discontinued    Health Maintenance Items Addressed: Vaccines Due: flu  and covid  Additional Screening:  Vision Screening: Recommended annual ophthalmology exams for early detection of glaucoma and other disorders of the eye. Is the patient up to date with their annual eye exam?  Yes  Who is the provider or what is the name of the office in which the patient attends annual eye exams? Dr. Glendia  Dental Screening: Recommended annual dental exams for proper oral hygiene  Community Resource Referral / Chronic Care Management: CRR required this visit?  No   CCM required this visit?  No   Plan:    I have personally reviewed and noted the following in the patient's chart:   Medical and social history Use of alcohol, tobacco or illicit drugs  Current medications and supplements including opioid prescriptions. Patient is not currently taking opioid prescriptions. Functional ability and status Nutritional status Physical activity Advanced directives List of other physicians Hospitalizations,  surgeries, and ER visits in previous 12 months Vitals Screenings to include cognitive, depression, and falls Referrals and appointments  In addition, I have reviewed and discussed with patient certain preventive protocols, quality metrics, and best practice recommendations. A written personalized care plan for preventive services as well as general preventive health recommendations were provided to patient.   Lindsay FORBES Dawn, LPN   [id number]   After Visit Summary: (MyChart) Due to this being a telephonic visit, the after visit summary with patients personalized plan was offered to patient via MyChart   Notes: Nothing significant to report at this time.

## 2023-12-15 NOTE — Progress Notes (Signed)
 Annual physical  Subjective    Patient ID: Lindsay [name], female    DOB: [birth year]  Age: 78 y.o. MRN: [id number]  Chief Complaint  Patient presents with   Medical Management of Chronic Issues   HPI Lindsay [name] is a 78 y.o. old female here  for annual exam.   Subjective -  Reports feeling unstable on feet with some numbness, tingling, and burning, concerning for neuropathy. Also reports intermittent dizziness, especially when getting up at night or tilting head back, questioning if related to prior vertigo. - Inquires about getting flu shot.  Medications Reports taking vitamin C, baby aspirin , escitalopram (last refilled 09/2023), Synthroid  25 mcg (managed by endocrinology), multivitamin, zinc, probiotic, and propylene glycol eye drops. Also takes turmeric and vitamin D  50,000 units once weekly. No longer taking elderberry. Unsure what safety seal is, was told to remove it from the list.  PMH, PSH, FH, Social Hx PMHx: Hypothyroidism with goiter (monitored by endocrinology, Dr. Tommas, last seen 3 years ago, next appt in 01/2024), history of benign paroxysmal positional vertigo (BPPV) treated with physical therapy maneuvers years ago, overactive bladder (participated in an 8-year urological study with Dr. Alvia for an electrical nerve stimulator implant in the heel), pes planus, broken left foot and right small toe (previous x-rays available). Last colonoscopy in 2018, told no more needed. Last mammogram in 05/2023. Last DEXA scan unclear, but expects one as part of a weight loss study. Recently recovered from COVID-19 in early 10/2023. Immunizations up-to-date, including pneumococcal and shingles. PSH: Heel implant for overactive bladder. Social Hx: Divorced. Lives alone. Retired. Moved to be closer to daughter and grandchildren. No tobacco use. Reports rare alcohol use (e.g., half a beer 3 times a year). Currently not exercising regularly due to recent move but is looking for places to walk  and may have structured exercise in an upcoming weight loss study. Enrolled in a weight loss study at [facility] researching adrenalate to prevent bone loss during weight loss in older adults.  ROS Neurologic: Reports mild tingling and burning in feet, with more pronounced numbness. Reports instability and balance issues. Reports intermittent dizziness/lightheadedness upon standing, and vertigo with head tilting. Denies constant vertigo. GU: Reports overactive bladder, nocturia 2-3 times per night.   The 10-year ASCVD risk score (Arnett DK, et al., 2019) is: 22.2%  Health Maintenance Due  Topic Date Due   COVID-19 Vaccine (6 - 2025-26 season) 10/31/2023      Objective:     BP 136/83   Pulse 82   Ht 5' 3.5 (1.613 m)   Wt 179 lb 1.3 oz (81.2 kg)   SpO2 96%   BMI 31.22 kg/m    Physical Exam Gen: alert, oriented HEENT: perrla, eomi, mmm CV: rrr, no murmur Pulm: lctab. No wheeze or crackles.  GI: soft, nbs.  Nontender to palpation MSK: strength equal b/l. Normal gait Neuro: - Sensation: Light touch sensation intact but reported as dull on the lateral plantar surface  and 5th toes of both feet. - Proprioception:  Ext: no pedal edema Skin: warm and dry, no rashes Psych: pleasant affect.  Spontaneous speech     Assessment & Plan:   Physical exam, annual  GAD (generalized anxiety disorder) -     Citalopram  Hydrobromide; Take 1 tablet (10 mg total) by mouth daily.  Dispense: 30 tablet; Refill: 2  Vitamin D  insufficiency -     Vitamin D  (Ergocalciferol ); Take 1 capsule by mouth once a week.  Dispense: 12 capsule; Refill: 0 -  VITAMIN D  25 Hydroxy (Vit-D Deficiency, Fractures)  Neuropathy Assessment & Plan: Reports progressive symptoms of numbness, mild burning/tingling in feet leading to instability. Exam notable for decreased sensation to light touch and monofilament, particularly in the toes and lateral feet. Etiology is unclear. - Labs ordered: CBC, CMP, HbA1c,  Vitamin B12, Ferritin. - Defer thyroid  panel as it will be checked by endocrinology in 01/2024. - Discussed that cause is often idiopathic if non-diabetic. - If labs are unrevealing, next step would be foot x-rays to evaluate for structural causes. - Discussed nerve conduction studies as a future option if needed. - Will review prior foot x-rays on file.  Orders: -     Comprehensive metabolic panel with GFR -     CBC with Differential/Platelet -     Lipid panel -     Hemoglobin A1c -     B12 and Folate Panel -     Iron, TIBC and Ferritin Panel -     Sedimentation rate -     C-reactive protein  Prediabetes -     Hemoglobin A1c  Hyperlipidemia, unspecified hyperlipidemia type -     Comprehensive metabolic panel with GFR -     Lipid panel  Immunization due -     Flu vaccine HIGH DOSE PF(Fluzone Trivalent)  Benign paroxysmal positional vertigo, unspecified laterality Assessment & Plan: Reports intermittent dizziness, described as lightheadedness upon standing and positional vertigo when tilting head back. History of BPPV. Concerned about recurrence. - Discussed benign paroxysmal positional vertigo (BPPV) and the Epley maneuver. Advised against self-maneuvering to avoid inducing vertigo. - Will place referral to Physical Therapy for evaluation of BPPV and for balance training.   Healthcare maintenance Assessment & Plan: New patient establishing care. History of hypothyroidism. Up to date on most screenings. - Escitalopram: Refill sent to pharmacy. - Influenza vaccine: Administered in office today. - Discussed finding a new primary care provider in the [place] area. Provided name of [facility]. - Schedule follow-up here in 6 months to review symptoms if a new PCP has not been established.      Return in about 6 months (around 06/14/2024) for neuropathy.    Toribio MARLA Slain, MD

## 2023-12-16 ENCOUNTER — Encounter: Payer: Self-pay | Admitting: Family Medicine

## 2023-12-16 DIAGNOSIS — E559 Vitamin D deficiency, unspecified: Secondary | ICD-10-CM | POA: Diagnosis not present

## 2023-12-16 DIAGNOSIS — R7303 Prediabetes: Secondary | ICD-10-CM | POA: Diagnosis not present

## 2023-12-16 DIAGNOSIS — E785 Hyperlipidemia, unspecified: Secondary | ICD-10-CM | POA: Diagnosis not present

## 2023-12-16 DIAGNOSIS — Z23 Encounter for immunization: Secondary | ICD-10-CM

## 2023-12-16 DIAGNOSIS — G629 Polyneuropathy, unspecified: Secondary | ICD-10-CM | POA: Diagnosis not present

## 2023-12-17 LAB — B12 AND FOLATE PANEL
Folate: 14.2 ng/mL (ref >3.0)
Vitamin B-12: 1073 pg/mL (ref 232–1245)

## 2023-12-17 LAB — COMPREHENSIVE METABOLIC PANEL WITH GFR
ALT: 23 IU/L (ref 0–32)
AST: 20 IU/L (ref 0–40)
Albumin: 4.1 g/dL (ref 3.8–4.8)
Alkaline Phosphatase: 92 IU/L (ref 49–135)
BUN/Creatinine Ratio: 15 (ref 12–28)
BUN: 13 mg/dL (ref 8–27)
Bilirubin Total: 0.9 mg/dL (ref 0.0–1.2)
CO2: 23 mmol/L (ref 20–29)
Calcium: 8.8 mg/dL (ref 8.7–10.3)
Chloride: 101 mmol/L (ref 96–106)
Creatinine, Ser: 0.84 mg/dL (ref 0.57–1.00)
Globulin, Total: 2.6 g/dL (ref 1.5–4.5)
Glucose: 89 mg/dL (ref 70–99)
Potassium: 4.4 mmol/L (ref 3.5–5.2)
Sodium: 137 mmol/L (ref 134–144)
Total Protein: 6.7 g/dL (ref 6.0–8.5)
eGFR: 72 mL/min/1.73 (ref >59)

## 2023-12-17 LAB — CBC WITH DIFFERENTIAL/PLATELET
Basophils Absolute: 0 x10E3/uL (ref 0.0–0.2)
Basos: 1 %
EOS (ABSOLUTE): 0.1 x10E3/uL (ref 0.0–0.4)
Eos: 2 %
Hematocrit: 39.8 % (ref 34.0–46.6)
Hemoglobin: 12.9 g/dL (ref 11.1–15.9)
Immature Grans (Abs): 0 x10E3/uL (ref 0.0–0.1)
Immature Granulocytes: 0 %
Lymphocytes Absolute: 1.3 x10E3/uL (ref 0.7–3.1)
Lymphs: 23 %
MCH: 30.3 pg (ref 26.6–33.0)
MCHC: 32.4 g/dL (ref 31.5–35.7)
MCV: 93 fL (ref 79–97)
Monocytes Absolute: 0.7 x10E3/uL (ref 0.1–0.9)
Monocytes: 12 %
Neutrophils Absolute: 3.6 x10E3/uL (ref 1.4–7.0)
Neutrophils: 62 %
Platelets: 257 x10E3/uL (ref 150–450)
RBC: 4.26 x10E6/uL (ref 3.77–5.28)
RDW: 12.5 % (ref 11.7–15.4)
WBC: 5.7 x10E3/uL (ref 3.4–10.8)

## 2023-12-17 LAB — HEMOGLOBIN A1C
Est. average glucose Bld gHb Est-mCnc: 114 mg/dL
Hgb A1c MFr Bld: 5.6 % (ref 4.8–5.6)

## 2023-12-17 LAB — LIPID PANEL
Chol/HDL Ratio: 3.7 ratio (ref 0.0–4.4)
Cholesterol, Total: 237 mg/dL — ABNORMAL HIGH (ref 100–199)
HDL: 64 mg/dL (ref >39)
LDL Chol Calc (NIH): 156 mg/dL — ABNORMAL HIGH (ref 0–99)
Triglycerides: 96 mg/dL (ref 0–149)
VLDL Cholesterol Cal: 17 mg/dL (ref 5–40)

## 2023-12-17 LAB — IRON,TIBC AND FERRITIN PANEL
Ferritin: 65 ng/mL (ref 15–150)
Iron Saturation: 30 % (ref 15–55)
Iron: 93 ug/dL (ref 27–139)
Total Iron Binding Capacity: 314 ug/dL (ref 250–450)
UIBC: 221 ug/dL (ref 118–369)

## 2023-12-17 LAB — VITAMIN D 25 HYDROXY (VIT D DEFICIENCY, FRACTURES): Vit D, 25-Hydroxy: 63.5 ng/mL (ref 30.0–100.0)

## 2023-12-17 LAB — C-REACTIVE PROTEIN: CRP: 4 mg/L (ref 0–10)

## 2023-12-17 LAB — SEDIMENTATION RATE: Sed Rate: 30 mm/h (ref 0–40)

## 2023-12-18 DIAGNOSIS — Z0000 Encounter for general adult medical examination without abnormal findings: Secondary | ICD-10-CM | POA: Insufficient documentation

## 2023-12-18 DIAGNOSIS — G629 Polyneuropathy, unspecified: Secondary | ICD-10-CM | POA: Insufficient documentation

## 2023-12-18 NOTE — Assessment & Plan Note (Signed)
 New patient establishing care. History of hypothyroidism. Up to date on most screenings. - Escitalopram: Refill sent to pharmacy. - Influenza vaccine: Administered in office today. - Discussed finding a new primary care provider in the [place] area. Provided name of [facility]. - Schedule follow-up here in 6 months to review symptoms if a new PCP has not been established.

## 2023-12-18 NOTE — Assessment & Plan Note (Signed)
 Reports progressive symptoms of numbness, mild burning/tingling in feet leading to instability. Exam notable for decreased sensation to light touch and monofilament, particularly in the toes and lateral feet. Etiology is unclear. - Labs ordered: CBC, CMP, HbA1c, Vitamin B12, Ferritin. - Defer thyroid  panel as it will be checked by endocrinology in 01/2024. - Discussed that cause is often idiopathic if non-diabetic. - If labs are unrevealing, next step would be foot x-rays to evaluate for structural causes. - Discussed nerve conduction studies as a future option if needed. - Will review prior foot x-rays on file.

## 2023-12-18 NOTE — Assessment & Plan Note (Signed)
 Reports intermittent dizziness, described as lightheadedness upon standing and positional vertigo when tilting head back. History of BPPV. Concerned about recurrence. - Discussed benign paroxysmal positional vertigo (BPPV) and the Epley maneuver. Advised against self-maneuvering to avoid inducing vertigo. - Will place referral to Physical Therapy for evaluation of BPPV and for balance training.

## 2023-12-19 ENCOUNTER — Ambulatory Visit: Payer: Self-pay | Admitting: Family Medicine

## 2023-12-19 DIAGNOSIS — G629 Polyneuropathy, unspecified: Secondary | ICD-10-CM

## 2023-12-26 ENCOUNTER — Other Ambulatory Visit: Payer: Self-pay | Admitting: Family Medicine

## 2023-12-26 DIAGNOSIS — G629 Polyneuropathy, unspecified: Secondary | ICD-10-CM | POA: Diagnosis not present

## 2023-12-28 ENCOUNTER — Ambulatory Visit: Payer: Self-pay | Admitting: Family Medicine

## 2023-12-28 LAB — PROTEIN ELECTROPHORESIS, SERUM
A/G Ratio: 1 (ref 0.7–1.7)
Albumin ELP: 3.6 g/dL (ref 2.9–4.4)
Alpha 1: 0.2 g/dL (ref 0.0–0.4)
Alpha 2: 0.9 g/dL (ref 0.4–1.0)
Beta: 1 g/dL (ref 0.7–1.3)
Gamma Globulin: 1.3 g/dL (ref 0.4–1.8)
Globulin, Total: 3.5 g/dL (ref 2.2–3.9)
Total Protein: 7.1 g/dL (ref 6.0–8.5)

## 2023-12-29 DIAGNOSIS — H5203 Hypermetropia, bilateral: Secondary | ICD-10-CM | POA: Diagnosis not present

## 2024-01-05 ENCOUNTER — Ambulatory Visit

## 2024-01-09 NOTE — Addendum Note (Signed)
 Addended by: ALM DELON SAILOR on: 01/09/2024 09:35 PM   Modules accepted: Orders

## 2024-01-23 ENCOUNTER — Encounter: Payer: Self-pay | Admitting: Dermatology

## 2024-01-23 ENCOUNTER — Ambulatory Visit: Admitting: Dermatology

## 2024-01-23 VITALS — BP 93/62 | HR 82

## 2024-01-23 DIAGNOSIS — D692 Other nonthrombocytopenic purpura: Secondary | ICD-10-CM | POA: Diagnosis not present

## 2024-01-23 DIAGNOSIS — L700 Acne vulgaris: Secondary | ICD-10-CM | POA: Diagnosis not present

## 2024-01-23 DIAGNOSIS — L720 Epidermal cyst: Secondary | ICD-10-CM | POA: Diagnosis not present

## 2024-01-23 NOTE — Progress Notes (Addendum)
" ° °  Follow-Up Visit   Patient (and/or pt guardian) consented to the use of AI-assisted tools for note generation.  Subjective  Lindsay [name] is a 78 y.o. female who presents for the following: Wart  Patient was last evaluated on 11/23/23.  At this visit patient was treated with Cantharidin and Cryotherapy.  Patient reports sxs are better. Patient report wart is no longer there and skin is smooth in that area Patient denies medication changes.  Patient would like to discuss cyst on left index Patient would like to discuss milia under right lower lip  The following portions of the chart were reviewed this encounter and updated as appropriate: medications, allergies, medical history  Review of Systems:  No other skin or systemic complaints except as noted in HPI or Assessment and Plan.  Objective  Well appearing patient in no apparent distress; mood and affect are within normal limits.  A focused examination was performed of the following areas: Right 2nd Fingertip   Relevant exam findings are noted in the Assessment and Plan.       Assessment & Plan ACNE VULGARIS WART Exam: verrucous papule(s) have fully resolved    Milia of lip and chin Milia present on the lip and chin. The milia on the lip is small and not amenable to expression. The milia on the chin is not visible but may be present as a raised area. - Performed electrocautery on the one milia on the lip to remove it. (Not charging separate fee for this, pt will see how it heals and if she wants more removed she will schedule a cosmetic appointment) - Advised that any additional milia removal will incur a cosmetic charge.  Senile purpura Likely due to sun exposure and age-related skin thinning. It is not dangerous and is a result of trauma to blood vessels under the skin. - Reassured that senile purpura is not dangerous and is a result of sun exposure and age-related skin thinning.     I, Lyle Cords, as  acting as a neurosurgeon for [employer], DO .   Documentation: I have reviewed the above documentation for accuracy and completeness, and I agree with the above.  Delon Lenis, DO   "

## 2024-01-23 NOTE — Patient Instructions (Signed)

## 2024-02-13 DIAGNOSIS — E049 Nontoxic goiter, unspecified: Secondary | ICD-10-CM | POA: Diagnosis not present

## 2024-02-16 ENCOUNTER — Other Ambulatory Visit: Payer: Self-pay | Admitting: Endocrinology

## 2024-02-16 DIAGNOSIS — E049 Nontoxic goiter, unspecified: Secondary | ICD-10-CM

## 2024-03-04 NOTE — Addendum Note (Signed)
 Addended by: ALM DELON SAILOR on: 03/04/2024 09:19 PM   Modules accepted: Orders

## 2024-03-05 ENCOUNTER — Other Ambulatory Visit

## 2024-03-12 ENCOUNTER — Ambulatory Visit
Admission: RE | Admit: 2024-03-12 | Discharge: 2024-03-12 | Disposition: A | Source: Ambulatory Visit | Attending: Endocrinology | Admitting: Endocrinology

## 2024-03-12 DIAGNOSIS — E049 Nontoxic goiter, unspecified: Secondary | ICD-10-CM

## 2024-03-21 ENCOUNTER — Ambulatory Visit: Admitting: Family Medicine

## 2024-04-03 ENCOUNTER — Ambulatory Visit: Admitting: Family Medicine

## 2024-04-04 ENCOUNTER — Telehealth: Payer: Self-pay

## 2024-04-04 NOTE — Telephone Encounter (Signed)
 Copied from CRM #[id number]. Topic: Clinical - Medication Refill >> Apr 04, 2024  1:01 PM Fonda T wrote: Medication: citalopram  (CELEXA ) 10 MG tablet  Has the patient contacted their pharmacy? Yes, advised to contact office   This is the patient's preferred pharmacy:  [facility] [place], [place] - [address] [place] [address] [place] [address] Phone: [phone] Fax: [phone]    Is this the correct pharmacy for this prescription? Yes If no, delete pharmacy and type the correct one.   Has the prescription been filled recently? Yes  Is the patient out of the medication? No, almost out, not completely  Has the patient been seen for an appointment in the last year OR does the patient have an upcoming appointment? Yes  Can we respond through MyChart? No, prefers phone call at [phone]  Agent: Please be advised that Rx refills may take up to 3 business days. We ask that you follow-up with your pharmacy.

## 2024-04-05 ENCOUNTER — Other Ambulatory Visit: Payer: Self-pay | Admitting: Family Medicine

## 2024-04-05 DIAGNOSIS — F411 Generalized anxiety disorder: Secondary | ICD-10-CM

## 2024-04-05 MED ORDER — CITALOPRAM HYDROBROMIDE 10 MG PO TABS: 10.0000 mg | ORAL_TABLET | Freq: Every day | ORAL | 2 refills | Status: AC

## 2024-05-16 ENCOUNTER — Ambulatory Visit: Admitting: Family Medicine

## 2024-06-14 ENCOUNTER — Ambulatory Visit: Admitting: Family Medicine

## 2024-08-01 ENCOUNTER — Ambulatory Visit: Admitting: Dermatology
